# Patient Record
Sex: Male | Born: 1935 | Race: White | Hispanic: No | Smoking: Former smoker
Health system: Southern US, Community
[De-identification: ages and names within clinical notes are randomized; demographics above are authoritative.]

## PROBLEM LIST (undated history)

## (undated) DIAGNOSIS — I10 Essential (primary) hypertension: Secondary | ICD-10-CM

## (undated) DIAGNOSIS — I50810 Right heart failure, unspecified: Secondary | ICD-10-CM

## (undated) DIAGNOSIS — J449 Chronic obstructive pulmonary disease, unspecified: Secondary | ICD-10-CM

## (undated) DIAGNOSIS — E785 Hyperlipidemia, unspecified: Secondary | ICD-10-CM

## (undated) DIAGNOSIS — I779 Disorder of arteries and arterioles, unspecified: Secondary | ICD-10-CM

## (undated) DIAGNOSIS — Z5189 Encounter for other specified aftercare: Secondary | ICD-10-CM

## (undated) DIAGNOSIS — K222 Esophageal obstruction: Principal | ICD-10-CM

## (undated) DIAGNOSIS — K7460 Unspecified cirrhosis of liver: Secondary | ICD-10-CM

## (undated) DIAGNOSIS — K219 Gastro-esophageal reflux disease without esophagitis: Secondary | ICD-10-CM

## (undated) DIAGNOSIS — R188 Other ascites: Secondary | ICD-10-CM

## (undated) DIAGNOSIS — K635 Polyp of colon: Secondary | ICD-10-CM

## (undated) DIAGNOSIS — K5792 Diverticulitis of intestine, part unspecified, without perforation or abscess without bleeding: Secondary | ICD-10-CM

## (undated) DIAGNOSIS — E119 Type 2 diabetes mellitus without complications: Secondary | ICD-10-CM

## (undated) DIAGNOSIS — I2510 Atherosclerotic heart disease of native coronary artery without angina pectoris: Secondary | ICD-10-CM

## (undated) DIAGNOSIS — I739 Peripheral vascular disease, unspecified: Secondary | ICD-10-CM

## (undated) DIAGNOSIS — Z8739 Personal history of other diseases of the musculoskeletal system and connective tissue: Secondary | ICD-10-CM

## (undated) DIAGNOSIS — I5032 Chronic diastolic (congestive) heart failure: Secondary | ICD-10-CM

## (undated) DIAGNOSIS — N289 Disorder of kidney and ureter, unspecified: Secondary | ICD-10-CM

## (undated) DIAGNOSIS — I272 Pulmonary hypertension, unspecified: Secondary | ICD-10-CM

## (undated) DIAGNOSIS — IMO0001 Reserved for inherently not codable concepts without codable children: Secondary | ICD-10-CM

## (undated) DIAGNOSIS — J9610 Chronic respiratory failure, unspecified whether with hypoxia or hypercapnia: Secondary | ICD-10-CM

## (undated) HISTORY — DX: Reserved for inherently not codable concepts without codable children: IMO0001

## (undated) HISTORY — DX: Type 2 diabetes mellitus without complications: E11.9

## (undated) HISTORY — DX: Hyperlipidemia, unspecified: E78.5

## (undated) HISTORY — DX: Disorder of kidney and ureter, unspecified: N28.9

## (undated) HISTORY — DX: Diverticulitis of intestine, part unspecified, without perforation or abscess without bleeding: K57.92

## (undated) HISTORY — DX: Esophageal obstruction: K22.2

## (undated) HISTORY — PX: ELBOW SURGERY: SHX618

## (undated) HISTORY — DX: Gastro-esophageal reflux disease without esophagitis: K21.9

## (undated) HISTORY — PX: CORONARY STENT PLACEMENT: SHX1402

## (undated) HISTORY — PX: CORONARY ARTERY BYPASS GRAFT: SHX141

## (undated) HISTORY — DX: Essential (primary) hypertension: I10

## (undated) HISTORY — DX: Personal history of other diseases of the musculoskeletal system and connective tissue: Z87.39

## (undated) HISTORY — DX: Polyp of colon: K63.5

## (undated) HISTORY — PX: TONSILLECTOMY: SUR1361

## (undated) HISTORY — DX: Encounter for other specified aftercare: Z51.89

## (undated) HISTORY — PX: BACK SURGERY: SHX140

---

## 1991-10-03 HISTORY — PX: OTHER SURGICAL HISTORY: SHX169

## 1998-10-01 ENCOUNTER — Inpatient Hospital Stay (HOSPITAL_COMMUNITY): Admission: EM | Admit: 1998-10-01 | Discharge: 1998-10-04 | Payer: Self-pay | Admitting: Cardiology

## 1999-02-15 ENCOUNTER — Encounter: Payer: Self-pay | Admitting: Critical Care Medicine

## 1999-02-15 ENCOUNTER — Ambulatory Visit (HOSPITAL_COMMUNITY): Admission: RE | Admit: 1999-02-15 | Discharge: 1999-02-15 | Payer: Self-pay | Admitting: Critical Care Medicine

## 2000-02-14 ENCOUNTER — Ambulatory Visit (HOSPITAL_COMMUNITY): Admission: RE | Admit: 2000-02-14 | Discharge: 2000-02-14 | Payer: Self-pay | Admitting: Critical Care Medicine

## 2000-02-14 ENCOUNTER — Encounter: Payer: Self-pay | Admitting: Critical Care Medicine

## 2002-04-23 ENCOUNTER — Inpatient Hospital Stay (HOSPITAL_COMMUNITY): Admission: EM | Admit: 2002-04-23 | Discharge: 2002-04-24 | Payer: Self-pay | Admitting: Emergency Medicine

## 2002-04-23 ENCOUNTER — Encounter: Payer: Self-pay | Admitting: Nephrology

## 2002-04-24 ENCOUNTER — Encounter: Payer: Self-pay | Admitting: Nephrology

## 2004-04-11 ENCOUNTER — Inpatient Hospital Stay (HOSPITAL_COMMUNITY): Admission: AD | Admit: 2004-04-11 | Discharge: 2004-04-13 | Payer: Self-pay | Admitting: Cardiology

## 2004-07-30 ENCOUNTER — Inpatient Hospital Stay (HOSPITAL_COMMUNITY): Admission: EM | Admit: 2004-07-30 | Discharge: 2004-08-01 | Payer: Self-pay | Admitting: Emergency Medicine

## 2004-08-04 ENCOUNTER — Ambulatory Visit: Payer: Self-pay | Admitting: Family Medicine

## 2004-08-16 ENCOUNTER — Ambulatory Visit: Payer: Self-pay | Admitting: Family Medicine

## 2004-08-22 ENCOUNTER — Ambulatory Visit: Payer: Self-pay | Admitting: Cardiology

## 2004-09-05 ENCOUNTER — Ambulatory Visit: Payer: Self-pay

## 2004-09-20 ENCOUNTER — Ambulatory Visit (HOSPITAL_COMMUNITY): Admission: RE | Admit: 2004-09-20 | Discharge: 2004-09-20 | Payer: Self-pay | Admitting: Cardiology

## 2004-09-22 ENCOUNTER — Ambulatory Visit: Payer: Self-pay | Admitting: Family Medicine

## 2004-09-23 ENCOUNTER — Ambulatory Visit: Payer: Self-pay

## 2004-11-02 ENCOUNTER — Ambulatory Visit: Payer: Self-pay | Admitting: Family Medicine

## 2004-11-18 ENCOUNTER — Ambulatory Visit: Payer: Self-pay | Admitting: Family Medicine

## 2004-12-27 ENCOUNTER — Ambulatory Visit: Payer: Self-pay | Admitting: Family Medicine

## 2005-01-23 ENCOUNTER — Ambulatory Visit: Payer: Self-pay | Admitting: Cardiology

## 2005-01-30 ENCOUNTER — Ambulatory Visit: Payer: Self-pay | Admitting: Family Medicine

## 2005-02-06 ENCOUNTER — Ambulatory Visit: Payer: Self-pay | Admitting: Family Medicine

## 2005-03-06 ENCOUNTER — Ambulatory Visit: Payer: Self-pay | Admitting: Family Medicine

## 2005-04-21 ENCOUNTER — Ambulatory Visit: Payer: Self-pay | Admitting: Family Medicine

## 2005-04-24 ENCOUNTER — Ambulatory Visit: Payer: Self-pay | Admitting: Cardiology

## 2005-05-16 ENCOUNTER — Ambulatory Visit: Payer: Self-pay | Admitting: Family Medicine

## 2005-05-22 ENCOUNTER — Ambulatory Visit: Payer: Self-pay | Admitting: Family Medicine

## 2005-07-14 ENCOUNTER — Ambulatory Visit: Payer: Self-pay | Admitting: Family Medicine

## 2005-07-30 ENCOUNTER — Inpatient Hospital Stay (HOSPITAL_COMMUNITY): Admission: EM | Admit: 2005-07-30 | Discharge: 2005-08-02 | Payer: Self-pay | Admitting: *Deleted

## 2005-07-31 ENCOUNTER — Ambulatory Visit: Payer: Self-pay | Admitting: Cardiology

## 2005-08-04 ENCOUNTER — Ambulatory Visit: Payer: Self-pay | Admitting: Cardiology

## 2005-08-11 ENCOUNTER — Ambulatory Visit: Payer: Self-pay | Admitting: Cardiology

## 2005-08-21 ENCOUNTER — Ambulatory Visit: Payer: Self-pay | Admitting: Family Medicine

## 2006-01-24 ENCOUNTER — Ambulatory Visit: Payer: Self-pay | Admitting: Family Medicine

## 2006-01-26 ENCOUNTER — Ambulatory Visit: Payer: Self-pay | Admitting: Family Medicine

## 2006-02-02 ENCOUNTER — Ambulatory Visit: Payer: Self-pay | Admitting: Family Medicine

## 2006-02-13 ENCOUNTER — Ambulatory Visit: Payer: Self-pay | Admitting: Family Medicine

## 2006-03-05 ENCOUNTER — Ambulatory Visit: Payer: Self-pay | Admitting: Cardiology

## 2006-04-03 ENCOUNTER — Ambulatory Visit: Payer: Self-pay | Admitting: Family Medicine

## 2006-05-01 ENCOUNTER — Ambulatory Visit: Payer: Self-pay | Admitting: Family Medicine

## 2006-05-07 ENCOUNTER — Ambulatory Visit: Payer: Self-pay | Admitting: Family Medicine

## 2006-05-14 ENCOUNTER — Ambulatory Visit: Payer: Self-pay | Admitting: Family Medicine

## 2006-05-17 ENCOUNTER — Ambulatory Visit: Payer: Self-pay | Admitting: Family Medicine

## 2006-06-13 ENCOUNTER — Ambulatory Visit: Payer: Self-pay | Admitting: Family Medicine

## 2006-07-17 ENCOUNTER — Ambulatory Visit: Payer: Self-pay | Admitting: Family Medicine

## 2006-08-07 ENCOUNTER — Ambulatory Visit: Payer: Self-pay | Admitting: Family Medicine

## 2006-09-10 ENCOUNTER — Ambulatory Visit: Payer: Self-pay | Admitting: Cardiology

## 2006-09-26 ENCOUNTER — Ambulatory Visit: Payer: Self-pay | Admitting: Family Medicine

## 2006-10-19 ENCOUNTER — Ambulatory Visit: Payer: Self-pay | Admitting: Family Medicine

## 2006-11-13 ENCOUNTER — Ambulatory Visit: Payer: Self-pay | Admitting: Family Medicine

## 2007-03-21 ENCOUNTER — Ambulatory Visit: Payer: Self-pay | Admitting: Family Medicine

## 2007-04-25 ENCOUNTER — Ambulatory Visit: Payer: Self-pay | Admitting: Cardiology

## 2007-08-20 ENCOUNTER — Inpatient Hospital Stay (HOSPITAL_COMMUNITY): Admission: EM | Admit: 2007-08-20 | Discharge: 2007-08-23 | Payer: Self-pay | Admitting: Emergency Medicine

## 2007-08-20 ENCOUNTER — Ambulatory Visit: Payer: Self-pay | Admitting: Cardiology

## 2007-08-27 ENCOUNTER — Ambulatory Visit: Payer: Self-pay | Admitting: Cardiology

## 2008-02-10 ENCOUNTER — Ambulatory Visit: Payer: Self-pay | Admitting: Cardiology

## 2008-08-26 ENCOUNTER — Ambulatory Visit: Payer: Self-pay | Admitting: Cardiology

## 2008-10-15 ENCOUNTER — Inpatient Hospital Stay (HOSPITAL_COMMUNITY): Admission: EM | Admit: 2008-10-15 | Discharge: 2008-10-17 | Payer: Self-pay | Admitting: Emergency Medicine

## 2008-10-15 ENCOUNTER — Ambulatory Visit: Payer: Self-pay | Admitting: Internal Medicine

## 2008-11-17 ENCOUNTER — Ambulatory Visit: Payer: Self-pay | Admitting: Cardiology

## 2009-01-14 DIAGNOSIS — I10 Essential (primary) hypertension: Secondary | ICD-10-CM

## 2009-01-14 DIAGNOSIS — Z862 Personal history of diseases of the blood and blood-forming organs and certain disorders involving the immune mechanism: Secondary | ICD-10-CM

## 2009-01-14 DIAGNOSIS — E119 Type 2 diabetes mellitus without complications: Secondary | ICD-10-CM

## 2009-01-14 DIAGNOSIS — Z8639 Personal history of other endocrine, nutritional and metabolic disease: Secondary | ICD-10-CM

## 2009-01-14 DIAGNOSIS — E785 Hyperlipidemia, unspecified: Secondary | ICD-10-CM

## 2009-01-18 ENCOUNTER — Encounter: Payer: Self-pay | Admitting: Cardiology

## 2009-01-18 ENCOUNTER — Ambulatory Visit: Payer: Self-pay | Admitting: Cardiology

## 2009-05-16 ENCOUNTER — Emergency Department (HOSPITAL_COMMUNITY): Admission: EM | Admit: 2009-05-16 | Discharge: 2009-05-17 | Payer: Self-pay | Admitting: Emergency Medicine

## 2009-07-26 ENCOUNTER — Encounter (INDEPENDENT_AMBULATORY_CARE_PROVIDER_SITE_OTHER): Payer: Self-pay | Admitting: *Deleted

## 2009-07-29 ENCOUNTER — Ambulatory Visit: Payer: Self-pay | Admitting: Cardiology

## 2009-08-12 ENCOUNTER — Encounter: Payer: Self-pay | Admitting: Cardiology

## 2009-08-12 ENCOUNTER — Ambulatory Visit: Payer: Self-pay

## 2009-10-20 ENCOUNTER — Encounter: Payer: Self-pay | Admitting: Cardiology

## 2010-01-20 ENCOUNTER — Ambulatory Visit: Payer: Self-pay | Admitting: Cardiology

## 2010-01-24 ENCOUNTER — Encounter: Payer: Self-pay | Admitting: Cardiology

## 2010-06-20 ENCOUNTER — Telehealth: Payer: Self-pay | Admitting: Cardiology

## 2010-07-20 ENCOUNTER — Ambulatory Visit: Payer: Self-pay | Admitting: Cardiology

## 2010-08-22 ENCOUNTER — Encounter: Payer: Self-pay | Admitting: Cardiology

## 2010-08-22 ENCOUNTER — Ambulatory Visit: Payer: Self-pay

## 2010-08-23 ENCOUNTER — Telehealth (INDEPENDENT_AMBULATORY_CARE_PROVIDER_SITE_OTHER): Payer: Self-pay | Admitting: *Deleted

## 2010-08-31 ENCOUNTER — Encounter: Payer: Self-pay | Admitting: Cardiology

## 2010-11-01 NOTE — Progress Notes (Signed)
  Walk in Patient Form Recieved " Pt left paper from [facility] to be completed for Plavix" sent to Blanchard Mane [facility]iem[facility]  August 23, 2010 8:34 AM

## 2010-11-01 NOTE — Assessment & Plan Note (Signed)
Summary: f45m   Visit Type:  6 months follow up Primary Provider:  [place]  CC:  No cardiac complaints.  History of Present Illness: Has had some problems, but not cardiac.  Uric acid got very high.  He was placed on medication and that is down.  Then he developed bronchitis, and got put on antibiotics, and he is better.  He is still upset over the loss of his wife.  Dr. Lysbeth Galas gave him some sleeping pills.    Current Medications (verified): 1)  [facility] Spect[facility]ite Senior  Tabs (Multiple Vitamins-Minerals) .... Take 1 Tablet By Mouth Once A Day 2)  Metoprolol Tartrate 50 Mg Tabs (Metoprolol Tartrate) .... Take One Tablet By Mouth Twice A Day 3)  Hydrochlorothiazide 25 Mg Tabs (Hydrochlorothiazide) .... Take One Tablet By Mouth Daily. 4)  Plavix 75 Mg Tabs (Clopidogrel Bisulfate) .... Take One Tablet By Mouth Daily 5)  Meloxicam 7.5 Mg Tabs (Meloxicam) .... Take 1 Tablet By Mouth Once A Day 6)  Aspirin 81 Mg Tbec (Aspirin) .... Take One Tablet By Mouth Daily 7)  Omeprazole 20 Mg Tbec (Omeprazole) .... Take 1 Tablet By Mouth Once A Day 8)  Nitroglycerin 0.4 Mg Subl (Nitroglycerin) .... One Tablet Under Tongue Every 5 Minutes As Needed For Chest Pain---May Repeat Times Three 9)  Amlodipine Besylate 5 Mg Tabs (Amlodipine Besylate) .... Take 1 Tablet Am and 1/2 Tablet Pm 10)  Crestor 10 Mg Tabs (Rosuvastatin Calcium) .... Take 1/2 Tablet Daily 11)  Zetia 10 Mg Tabs (Ezetimibe) .... Take One Tablet By Mouth Daily.  Allergies (verified): No Known Drug Allergies  Vital Signs:  Patient profile:   77 year old male Height:      70 inches Weight:      178.25 pounds BMI:     25.67 Pulse rate:   47 / minute Pulse rhythm:   regular Resp:     18 per minute BP sitting:   130 / 60  (left arm) Cuff size:   large  Vitals Entered By: Vikki Ports (July 20, 2010 11:36 AM)  Physical Exam  General:  Well developed, well nourished, in no acute distress. Head:  normocephalic and atraumatic Eyes:   PERRLA/EOM intact; conjunctiva and lids normal. Lungs:  Clear bilaterally to auscultation and percussion. Heart:  PMI non displaced.  Normal S1 and S2.  Minimal SEM.  No DM.   Pulses:  pulses normal in all 4 extremities Extremities:  No clubbing or cyanosis.   EKG  Procedure date:  07/20/2010  Findings:      SB, otherwise ok.    Cardiac Cath  Procedure date:  10/16/2008  Findings:      CONCLUSION:   1. Preserved overall left ventricular function.   2. Continued patency of the internal mammary to the diagonal LAD.   3. Continued patency of the saphenous vein graft to the distal right       with continued patency of the stent at its insertion.   4. Continued patency of the stent to the mid circumflex.   5. Moderate lesions of the OM-2 and distal AV circumflex as described       above.      DISPOSITION:  I have carefully reviewed the old films and compared them   to the current studies.  The circumflex OM lesions do not appear to be   substantially progressed.  It potentially could be the source of   ischemia, but does not appear to be unstable as such, given the  current   anatomy I am inclined to treat him medically at the present time.  We   will see how he does first and I will see him back in followup in the   office soon.      Impression & Recommendations:  Problem # 1:  CAD, ARTERY BYPASS GRAFT (ICD-414.04)  No current symptoms at present time.  Dealing with grief of loss of wife. See cath report. His updated medication list for this problem includes:    Metoprolol Tartrate 50 Mg Tabs (Metoprolol tartrate) .Marland Kitchen... Take one tablet by mouth twice a day    Plavix 75 Mg Tabs (Clopidogrel bisulfate) .Marland Kitchen... Take one tablet by mouth daily    Aspirin 81 Mg Tbec (Aspirin) .Marland Kitchen... Take one tablet by mouth daily    Nitroglycerin 0.4 Mg Subl (Nitroglycerin) ..... One tablet under tongue every 5 minutes as needed for chest pain---may repeat times three    Amlodipine Besylate 5 Mg Tabs  (Amlodipine besylate) .Marland Kitchen... Take 1 tablet am and 1/2 tablet pm  Orders: EKG w/ Interpretation (93000) Carotid Duplex (Carotid Duplex)  Problem # 2:  DYSLIPIDEMIA (ICD-272.4)  followed by Dr. Lysbeth Galas. His updated medication list for this problem includes:    Crestor 10 Mg Tabs (Rosuvastatin calcium) .Marland Kitchen... Take 1/2 tablet daily    Zetia 10 Mg Tabs (Ezetimibe) .Marland Kitchen... Take one tablet by mouth daily.  Orders: EKG w/ Interpretation (93000) Carotid Duplex (Carotid Duplex)  Problem # 3:  HYPERTENSION (ICD-401.9)  stable.  Controlled at present time.  His updated medication list for this problem includes:    Metoprolol Tartrate 50 Mg Tabs (Metoprolol tartrate) .Marland Kitchen... Take one tablet by mouth twice a day    Hydrochlorothiazide 25 Mg Tabs (Hydrochlorothiazide) .Marland Kitchen... Take one tablet by mouth daily.    Aspirin 81 Mg Tbec (Aspirin) .Marland Kitchen... Take one tablet by mouth daily    Amlodipine Besylate 5 Mg Tabs (Amlodipine besylate) .Marland Kitchen... Take 1 tablet am and 1/2 tablet pm  Orders: EKG w/ Interpretation (93000) Carotid Duplex (Carotid Duplex)  Problem # 4:  CAROTID ARTERY STENOSIS (ICD-433.10)  Needs follow up carotid next month.  See prior report.  40-60% bilaterally. His updated medication list for this problem includes:    Plavix 75 Mg Tabs (Clopidogrel bisulfate) .Marland Kitchen... Take one tablet by mouth daily    Aspirin 81 Mg Tbec (Aspirin) .Marland Kitchen... Take one tablet by mouth daily  Orders: EKG w/ Interpretation (93000) Carotid Duplex (Carotid Duplex)  Patient Instructions: 1)  Your physician recommends that you continue on your current medications as directed. Please refer to the Current Medication list given to you today. 2)  Your physician wants you to follow-up in: 6 MONTHS.  You will receive a reminder letter in the mail two months in advance. If you don't receive a letter, please call our office to schedule the follow-up appointment. 3)  Your physician has requested that you have a carotid duplex. This  test is an ultrasound of the carotid arteries in your neck. It looks at blood flow through these arteries that supply the brain with blood. Allow one hour for this exam. There are no restrictions or special instructions.

## 2010-11-01 NOTE — Progress Notes (Signed)
Summary: Samples  Phone Note Call from Patient Call back at [facility] Phone [phone]   Caller: Patient Summary of Call: Pt request call Initial call taken by: Judie Grieve,  June 20, 2010 3:33 PM  Follow-up for Phone Call        The pt requested samples of Zetia #35, LOT [id number] EXP 06/13. Follow-up by: Julieta Gutting, RN, BSN,  June 20, 2010 4:46 PM

## 2010-11-01 NOTE — Assessment & Plan Note (Signed)
Summary: f31m   Visit Type:  6 months follow up Primary Provider:  [place]   History of Present Illness: Wife just died from pancreatitis.  She became suddenly sick.  They have been married for 53 years. .  Now he has less to do.  He is doing ok given all things concerned.  It is lonesome for him at night.  No chest pain, or major cardiac symptoms.  BP has been elevated.    Current Medications (verified): 1)  [facility] Spect[facility]ite Senior  Tabs (Multiple Vitamins-Minerals) .... Take 1 Tablet By Mouth Once A Day 2)  Metoprolol Tartrate 50 Mg Tabs (Metoprolol Tartrate) .... Take One Tablet By Mouth Twice A Day 3)  Hydrochlorothiazide 25 Mg Tabs (Hydrochlorothiazide) .... Take One Tablet By Mouth Daily. 4)  Plavix 75 Mg Tabs (Clopidogrel Bisulfate) .... Take One Tablet By Mouth Daily 5)  Meloxicam 7.5 Mg Tabs (Meloxicam) .... Take 1 Tablet By Mouth Once A Day 6)  Aspirin 81 Mg Tbec (Aspirin) .... Take One Tablet By Mouth Daily 7)  Omeprazole 20 Mg Tbec (Omeprazole) .... Take 1 Tablet By Mouth Once A Day 8)  Nitroglycerin 0.4 Mg Subl (Nitroglycerin) .... One Tablet Under Tongue Every 5 Minutes As Needed For Chest Pain---May Repeat Times Three 9)  Amlodipine Besylate 5 Mg Tabs (Amlodipine Besylate) .... Take 1 Tablet Am and 1/2 Tablet Pm 10)  Crestor 10 Mg Tabs (Rosuvastatin Calcium) .... Take 1/2 Tablet Daily 11)  Zetia 10 Mg Tabs (Ezetimibe) .... Take One Tablet By Mouth Daily.  Allergies (verified): No Known Drug Allergies  Past History:  Past Medical History: Last updated: 01/14/2009 Current Problems:  RENAL INSUFFICIENCY (ICD-588.9) DYSLIPIDEMIA (ICD-272.4) HYPERTENSION (ICD-401.9) DIABETES MELLITUS (ICD-250.00) CAROTID ARTERY STENOSIS (ICD-433.10) CAD, ARTERY BYPASS GRAFT (ICD-414.04) GOUT, HX OF (ICD-V12.2)  Past Surgical History: Last updated: 01/14/2009 Coronary artery bypass graft in 1993 Taxus stenting of the distal right coronary artery- July 2005  Vital Signs:  Patient  profile:   77 year old male Height:      70 inches Weight:      177.50 pounds BMI:     25.56 Pulse rate:   60 / minute Pulse rhythm:   regular Resp:     18 per minute BP sitting:   140 / 54  (left arm) Cuff size:   large  Vitals Entered By: Vikki Ports (January 20, 2010 11:58 AM)  Physical Exam  General:  Well developed, well nourished, in no acute distress. Head:  normocephalic and atraumatic Eyes:  PERRLA/EOM intact; conjunctiva and lids normal. Lungs:  Clear bilaterally to auscultation and percussion. Heart:  PMI non displaced.  Normal S1 and S2.  Without murmur or rub.   Abdomen:  Bowel sounds positive; abdomen soft and non-tender without masses, organomegaly, or hernias noted. No hepatosplenomegaly. Msk:  gouty tophus R arm--classic Pulses:  pulses normal in all 4 extremities Extremities:  No clubbing or cyanosis. Neurologic:  Alert and oriented x 3.   EKG  Procedure date:  01/20/2010  Findings:      NSR.  WNL.    Impression & Recommendations:  Problem # 1:  CAD, ARTERY BYPASS GRAFT (ICD-414.04)  Has had stent to SVG, and also CFX stent.  G[facility] plavix [facility] through the [employer].  Gets it through [place].  Last cath 2008. His updated medication list for this problem includes:    Metoprolol Tartrate 50 Mg Tabs (Metoprolol tartrate) .Marland Kitchen... Take one tablet by mouth twice a day    Plavix 75 Mg  Tabs (Clopidogrel bisulfate) .Marland Kitchen... Take one tablet by mouth daily    Aspirin 81 Mg Tbec (Aspirin) .Marland Kitchen... Take one tablet by mouth daily    Nitroglycerin 0.4 Mg Subl (Nitroglycerin) ..... One tablet under tongue every 5 minutes as needed for chest pain---may repeat times three    Amlodipine Besylate 5 Mg Tabs (Amlodipine besylate) .Marland Kitchen... Take 1 tablet am and 1/2 tablet pm  Orders: EKG w/ Interpretation (93000)  Problem # 2:  HYPERTENSION (ICD-401.9) borderline systolics.   His updated medication list for this problem includes:    Metoprolol Tartrate 50 Mg Tabs  (Metoprolol tartrate) .Marland Kitchen... Take one tablet by mouth twice a day    Hydrochlorothiazide 25 Mg Tabs (Hydrochlorothiazide) .Marland Kitchen... Take one tablet by mouth daily.    Aspirin 81 Mg Tbec (Aspirin) .Marland Kitchen... Take one tablet by mouth daily    Amlodipine Besylate 5 Mg Tabs (Amlodipine besylate) .Marland Kitchen... Take 1 tablet am and 1/2 tablet pm  Problem # 3:  DYSLIPIDEMIA (ICD-272.4) awaiting copies of that.  His updated medication list for this problem includes:    Crestor 10 Mg Tabs (Rosuvastatin calcium) .Marland Kitchen... Take 1/2 tablet daily    Zetia 10 Mg Tabs (Ezetimibe) .Marland Kitchen... Take one tablet by mouth daily.  Problem # 4:  GOUT, HX OF (ICD-V12.2) tophus r arm. Reasonable candidate for surgery at this point.   Patient Instructions: 1)  Your physician recommends that you continue on your current medications as directed. Please refer to the Current Medication list given to you today. 2)  Your physician wants you to follow-up in:  6 MONTHS.  You will receive a reminder letter in the mail two months in advance. If you don't receive a letter, please call our office to schedule the follow-up appointment.

## 2010-11-03 NOTE — Medication Information (Signed)
Summary: [facility] Plavix Request Form   [facility] Plavix Request Form   Imported By: Roderic Ovens 09/22/2010 12:04:21  _____________________________________________________________________  External Attachment:    Type:   Image     Comment:   External Document

## 2011-01-07 LAB — BASIC METABOLIC PANEL
BUN: 24 mg/dL — ABNORMAL HIGH (ref 6–23)
CO2: 30 mEq/L (ref 19–32)
Calcium: 8.9 mg/dL (ref 8.4–10.5)
Chloride: 104 mEq/L (ref 96–112)
Creatinine, Ser: 1.02 mg/dL (ref 0.4–1.5)
GFR calc Af Amer: >60 mL/min (ref >60)
GFR calc non Af Amer: >60 mL/min (ref >60)
Glucose, Bld: 167 mg/dL — ABNORMAL HIGH (ref 70–99)
Potassium: 4.2 mEq/L (ref 3.5–5.1)
Sodium: 140 mEq/L (ref 135–145)

## 2011-01-07 LAB — POCT I-STAT, CHEM 8
BUN: 29 mg/dL — ABNORMAL HIGH (ref 6–23)
Calcium, Ion: 1.19 mmol/L (ref 1.12–1.32)
Chloride: 103 mEq/L (ref 96–112)
Creatinine, Ser: 1 mg/dL (ref 0.4–1.5)
Glucose, Bld: 154 mg/dL — ABNORMAL HIGH (ref 70–99)
HCT: 39 % (ref 39.0–52.0)
Hemoglobin: 13.3 g/dL (ref 13.0–17.0)
Potassium: 4.2 mEq/L (ref 3.5–5.1)
Sodium: 141 mEq/L (ref 135–145)
TCO2: 26 mmol/L (ref 0–100)

## 2011-01-07 LAB — CBC
HCT: 37.5 % — ABNORMAL LOW (ref 39.0–52.0)
Hemoglobin: 13 g/dL (ref 13.0–17.0)
MCHC: 34.6 g/dL (ref 30.0–36.0)
MCV: 92.1 fL (ref 78.0–100.0)
Platelets: 306 10*3/uL (ref 150–400)
RBC: 4.07 MIL/uL — ABNORMAL LOW (ref 4.22–5.81)
RDW: 13.1 % (ref 11.5–15.5)
WBC: 12 10*3/uL — ABNORMAL HIGH (ref 4.0–10.5)

## 2011-01-07 LAB — POCT CARDIAC MARKERS
CKMB, poc: <1 ng/mL — ABNORMAL LOW (ref 1.0–8.0)
Myoglobin, poc: 60.6 ng/mL (ref 12–200)
Troponin i, poc: <0.05 ng/mL (ref 0.00–0.09)

## 2011-01-16 LAB — COMPREHENSIVE METABOLIC PANEL
ALT: 13 U/L (ref 0–53)
AST: 18 U/L (ref 0–37)
Albumin: 3.9 g/dL (ref 3.5–5.2)
Alkaline Phosphatase: 88 U/L (ref 39–117)
BUN: 23 mg/dL (ref 6–23)
CO2: 25 mEq/L (ref 19–32)
Calcium: 9.6 mg/dL (ref 8.4–10.5)
Chloride: 101 mEq/L (ref 96–112)
Creatinine, Ser: 1.2 mg/dL (ref 0.4–1.5)
GFR calc Af Amer: >60 mL/min (ref >60)
GFR calc non Af Amer: 59 mL/min — ABNORMAL LOW (ref >60)
Glucose, Bld: 110 mg/dL — ABNORMAL HIGH (ref 70–99)
Potassium: 4.4 mEq/L (ref 3.5–5.1)
Sodium: 143 mEq/L (ref 135–145)
Total Bilirubin: 1.1 mg/dL (ref 0.3–1.2)
Total Protein: 6.2 g/dL (ref 6.0–8.3)

## 2011-01-16 LAB — PROTIME-INR
INR: 0.9 (ref 0.00–1.49)
Prothrombin Time: 12.6 seconds (ref 11.6–15.2)

## 2011-01-16 LAB — CK TOTAL AND CKMB (NOT AT ARMC)
CK, MB: 2.1 ng/mL (ref 0.3–4.0)
Relative Index: INVALID (ref 0.0–2.5)
Total CK: 73 U/L (ref 7–232)

## 2011-01-16 LAB — HEPARIN LEVEL (UNFRACTIONATED): Heparin Unfractionated: 0.86 IU/mL — ABNORMAL HIGH (ref 0.30–0.70)

## 2011-01-16 LAB — D-DIMER, QUANTITATIVE: D-Dimer, Quant: 0.8 ug/mL-FEU — ABNORMAL HIGH (ref 0.00–0.48)

## 2011-01-16 LAB — DIFFERENTIAL
Basophils Absolute: 0.2 10*3/uL — ABNORMAL HIGH (ref 0.0–0.1)
Basophils Relative: 2 % — ABNORMAL HIGH (ref 0–1)
Eosinophils Absolute: 0.3 10*3/uL (ref 0.0–0.7)
Eosinophils Relative: 3 % (ref 0–5)
Lymphocytes Relative: 28 % (ref 12–46)
Lymphs Abs: 2.8 10*3/uL (ref 0.7–4.0)
Monocytes Absolute: 0.9 10*3/uL (ref 0.1–1.0)
Monocytes Relative: 9 % (ref 3–12)
Neutro Abs: 6 10*3/uL (ref 1.7–7.7)
Neutrophils Relative %: 58 % (ref 43–77)

## 2011-01-16 LAB — BASIC METABOLIC PANEL
BUN: 23 mg/dL (ref 6–23)
CO2: 26 mEq/L (ref 19–32)
Chloride: 106 mEq/L (ref 96–112)
GFR calc Af Amer: >60 mL/min (ref >60)
GFR calc non Af Amer: >60 mL/min (ref >60)
Potassium: 4.6 mEq/L (ref 3.5–5.1)
Potassium: 4 mEq/L (ref 3.5–5.1)
Sodium: 137 mEq/L (ref 135–145)

## 2011-01-16 LAB — CBC
HCT: 42.3 % (ref 39.0–52.0)
Hemoglobin: 13.9 g/dL (ref 13.0–17.0)
Hemoglobin: 12.7 g/dL — ABNORMAL LOW (ref 13.0–17.0)
MCHC: 33 g/dL (ref 30.0–36.0)
MCHC: 33.1 g/dL (ref 30.0–36.0)
MCV: 94.9 fL (ref 78.0–100.0)
MCV: 94.3 fL (ref 78.0–100.0)
Platelets: 375 10*3/uL (ref 150–400)
RBC: 4.45 MIL/uL (ref 4.22–5.81)
RBC: 4.05 MIL/uL — ABNORMAL LOW (ref 4.22–5.81)
RDW: 12.6 % (ref 11.5–15.5)
WBC: 10.3 10*3/uL (ref 4.0–10.5)

## 2011-01-16 LAB — CARDIAC PANEL(CRET KIN+CKTOT+MB+TROPI)
CK, MB: 1.5 ng/mL (ref 0.3–4.0)
Troponin I: 0.02 ng/mL (ref 0.00–0.06)
Troponin I: <0.01 ng/mL (ref 0.00–0.06)

## 2011-01-16 LAB — GLUCOSE, CAPILLARY
Glucose-Capillary: 130 mg/dL — ABNORMAL HIGH (ref 70–99)
Glucose-Capillary: 132 mg/dL — ABNORMAL HIGH (ref 70–99)
Glucose-Capillary: 219 mg/dL — ABNORMAL HIGH (ref 70–99)

## 2011-01-16 LAB — MAGNESIUM: Magnesium: 1.9 mg/dL (ref 1.5–2.5)

## 2011-01-16 LAB — APTT: aPTT: 23 seconds — ABNORMAL LOW (ref 24–37)

## 2011-01-16 LAB — LIPID PANEL
HDL: 29 mg/dL — ABNORMAL LOW (ref >39)
Total CHOL/HDL Ratio: 7.7 RATIO

## 2011-01-16 LAB — TROPONIN I: Troponin I: <0.01 ng/mL (ref 0.00–0.06)

## 2011-02-14 NOTE — Discharge Summary (Signed)
NAMEJORIAN, WILLHOITE NO.:  [id number]   MEDICAL RECORD NO.:  [id number]          PATIENT TYPE:  INP   LOCATION:  2034                         FACILITY:  [facility]   PHYSICIAN:  Dorian Pod, ACNP  DATE OF BIRTH:  04-09-1929   DATE OF ADMISSION:  08/20/2007  DATE OF DISCHARGE:  08/23/2007                               DISCHARGE SUMMARY   DISCHARGING PHYSICIAN:  Dr. Bonnee Quin.   PRIMARY CARDIOLOGIST:  Dr. Riley Kill.   DISCHARGE DIAGNOSES:  1. Chest pain status post cardiac catheterization this admission.  2. The patient with well-preserved left ventricular function,      continued patency of internal mammary to the diagonal and left      anterior descending.  3. Continued patency of drug-eluting stent to the distal right vein      graft at it's insertion into the right coronary artery.  4. High grade stenosis of the circumflex, status post successful      percutaneous stenting of this vessel with an [employer] drug-eluting      stent.  5. Hyperlipidemia, LDL 132, the patient on Zetia with a history of      intolerance to Statins in the past.  6. Hypertension, stable at this time.  7. Peripheral arterial disease, history of carotid disease, positive      femoral bruits continue aspirin and Plavix and Zetia.  8. Gastroesophageal reflux disease.  Continue protein pump inhibitor.   PAST MEDICAL HISTORY:  1. Mild renal insufficiency.  2. Arthritis with chronic back pain.  3. Gout.  4. History of tobacco use.   PROCEDURES THIS ADMISSION:  Include cardiac catheterization.   HOSPITAL COURSE:  Mr. Gravely is a very pleasant 77 year old Caucasian  gentleman with known history of coronary disease, followed by Dr.  Riley Kill.  The patient presented this admission complaining of chest  discomfort similar to previous cardiac discomfort.  The patient is  status post coronary artery bypass grafting in 1993 and a Taxus drug-  eluting stent to the distal RCA via saphenous vein  graft in July 2005.  The patient states he has done well since that time.  He has been on  Plavix long-term.  This admission, chest discomfort came on suddenly.  He decided to get evaluated.  EKG in the emergency department showed  nonspecific ST-T wave changes, otherwise sinus rhythm.  Point of care  markers were normal.  Last evaluation (cath) was in 2005.  The patient  was admitted for observation, continued home medications with the  addition of IV nitroglycerin and heparin.  The patient evaluated by Dr.  Riley Kill, on the following day recommended cardiac catheterization for re-  evaluation, the patient to the cath lab on August 21, 2007, results as  stated above.  The patient tolerated procedure without complications,  did complain of some gout discomfort treated with a narcotic.  The  patient remained stable, transferred to telemetry.  Dr. Riley Kill in to  see the patient on day of discharge, afebrile, blood pressure 160/70,  cath site stable, hematocrit 35.5.  Patient being discharged home to  follow up with him Tuesday.  Appointment has been arranged for November  25 at 11:15 a.m.  The patient instructed to continue previous  medications at time of discharge including aspirin 325, Plavix 75,  Norvasc 5, HCTZ 25, Lopressor 50 b.i.d., Zetia 10 mg daily, Prilosec 20  mg daily, nitroglycerin as needed.  The patient has been given new  prescriptions for the Plavix, Norvasc, HCT, Lopressor, Zetia and  nitroglycerin.  He is also been given the post-cardiac catheterization  discharge instructions.  Duration of discharge encounter less than 30  minutes.      Dorian Pod, ACNP     MB/MEDQ  D:  08/23/2007  T:  08/24/2007  Job:  [id number]

## 2011-02-14 NOTE — Assessment & Plan Note (Signed)
[facility]                            CARDIOLOGY OFFICE NOTE   NAME:Weldy, DURANT SCIBILIA                     MRN:          [id number]  DATE:08/26/2008                            DOB:          [birth year]    DATE OF STUDY:  August 30, 2008.   Mr. Hochstatter is in for followup.  He is stable.  He has not been having  chest pain.  He is doing really quite well.  He has seen Dr. Lysbeth Galas  recently.  He is able to be active and without much difficulty.   His omeprazole recently was discontinued.   CURRENT MEDICATIONS:  He currently takes;  1. Multivitamin daily.  2. Metoprolol 50 mg b.i.d.  3. Hydrochlorothiazide 25 daily.  4. Zetia has been stopped as well.  5. Amlodipine 5 mg daily.  6. Plavix 75 mg daily.  7. Aspirin 81 mg daily.  8. Crestor 5 mg daily.   PHYSICAL EXAMINATION:  VITAL SIGNS:  Blood pressure is 142/60 and pulse  is 64.  LUNG:  Fields are clear.  CARDIAC:  Rhythm is regular.  EXTREMITIES:  No edema.   Electrocardiogram demonstrates normal sinus rhythm and nonspecific ST  abnormality.   Overall, the patient is doing well.   I plan to have him return to me in 6 months.  He is going to see Dr.  Lysbeth Galas next week to get laboratory work done.  Should he have any  problems in the interim, he is to call me.  I have told him that he can  reduce his aspirin to 81 mg.     [name]. Riley Kill, MD, [facility]  Electronically Signed    TDS/MedQ  DD: 08/26/2008  DT: 08/27/2008  Job #: [id number]

## 2011-02-14 NOTE — H&P (Signed)
NAMEJAKAREE, PICKARD NO.:  [id number]   MEDICAL RECORD NO.:  [id number]          PATIENT TYPE:  INP   LOCATION:  1823                         FACILITY:  [facility]   PHYSICIAN:  Jonelle Sidle, MD DATE OF BIRTH:  1929/05/12   DATE OF ADMISSION:  08/20/2007  DATE OF DISCHARGE:  08/20/2007                              HISTORY & PHYSICAL   PRIMARY CARDIOLOGIST:  Dr. Shawnie Pons.   REASON FOR ADMISSION:  Unstable angina.   HISTORY OF PRESENT ILLNESS:  Mr. Glazer is a pleasant 77 year old  gentleman with longstanding history of coronary artery disease.  He has  been followed long-term by Dr. Riley Kill and last saw him in the office  back in July at which time he was symptomatically stable.  The patient's  history includes coronary artery disease status post coronary artery  bypass grafting in 1993 including a LIMA to left anterior descending and  first diagonal as well as a saphenous vein graft to the right coronary  artery.  More recently the patient underwent placement of a Taxus drug-  eluting stent to the distal right coronary artery via the saphenous vein  graft in July 2005 and has done well since that time.  He has been on  Plavix long term.  Symptomatically, Mr. Morrell states that he has been  stable, although a few days ago he experienced a little chest pain  when he was walking in the evening.  Last night, while he was working at  the computer, he suddenly developed sensation of chest pressure.  This  lasted for several hours and waxed and waned.  Caused him to sleep very  poorly overnight and it was only earlier today when he was finally able  to take some sublingual nitroglycerin.  He states that he ultimately had  to take four or five tablets to cause his chest pain to improve and he  ultimately presented to the emergency department later this evening.  At  the present time, he is chest pain free, although did have some  recurrent pain earlier when he  presented.  His electrocardiogram in the  emergency department shows nonspecific ST-T wave changes with sinus  rhythm at 78 beats per minute and a prolonged PR interval of 217  milliseconds.  He has been compliant with medications which are outlined  below.  Initial point of care markers are normal.  He has not had any  ischemic evaluation since 2005.   ALLERGIES:  NO KNOWN DRUG ALLERGIES.   PRESENT MEDICATIONS:  1. Enteric-coated aspirin 81 mg p.o. daily.  2. Multivitamin one daily.  3. Hydrochlorothiazide 25 mg p.o. daily.  4. Norvasc 5 mg p.o. daily.  5. Prilosec 20 mg p.o. daily.  6. Metoprolol 50 mg p.o. b.i.d.  7. Plavix 75 mg p.o. daily.  8. Zetia 10 mg p.o. daily.   PAST MEDICAL HISTORY:  Is as outlined above.  Additional problems  include:  1. Hypertension.  2. Hyperlipidemia with previous intolerance to ZOCOR.  3. Carotid artery disease with a 60-80% left internal carotid artery      stenosis.  4.  Gastroesophageal reflux disease.  5. Previously noted mild renal insufficiency.  6. Arthritis with chronic back pain.   SOCIAL HISTORY:  The patient lives in [place].  He has a longstanding  tobacco use history.  He drinks alcohol occasionally.  He is retired  from [employer] many years ago.   FAMILY HISTORY:  Was reviewed.  There is a history of diabetes and  hypertension.   REVIEW OF SYSTEMS:  The patient has not had any recent fevers or chills.  No cough, hemoptysis, palpitations, syncope, lower extremity edema,  orthopnea or PND.  No obvious claudication.   EXAMINATION:  Blood pressure is 178/89, heart rate is 79.  The patient  breathing comfortably 20 times a minute.  This is an elderly male, no  acute distress, without active chest pain.  HEENT:  Conjunctivae/lids normal.  Oropharynx clear.  NECK:  Supple.  No elevated jugular venous pressure.  Soft left carotid  bruit.  LUNGS:  Clear, diminished breath sounds, somewhat rhonchorous  towards  the bases.  No wheezing or labored breathing.  CARDIAC EXAM:  Reveals a regular rate and rhythm.  Soft systolic murmur.  Preserved S2, no S3 gallop, pericardial rub.  ABDOMEN:  Soft, nontender, normoactive bowel sounds.  EXTREMITIES:  Exhibit no significant pitting edema.  Distal pulses are 1  to 2+.  SKIN:  Warm and dry.  MUSCULOSKELETAL:  No kyphosis is noted.  NEUROPSYCHIATRIC:  The patient is alert and oriented x3.  Affect is  normal.   INITIAL LABORATORY DATA:  WBC is 9.7, hemoglobin 14.4, hematocrit 42.7,  platelets 388,000.  Sodium 140, potassium 4.8, chloride 107, glucose  235, BUN 25, creatinine 1.2, CK-MB 2.4, troponin-I less than 0.05,  myoglobin 96.5.   IMPRESSION:  1. Symptoms concerning for unstable angina, stuttering over the last      24 hours and improved with sublingual nitroglycerin.  This is in      the setting of known cardiovascular disease status post previous      coronary artery bypass grafting in 1993 and ultimately status post      placement of a Taxus drug-eluting stent to the right coronary      artery via a saphenous vein graft in 2005.  Until recently, the      patient has been symptomatically stable and he has had no ischemic      evaluation over the last 3 years.  His electrocardiogram is      nonspecific and his initial point of care cardiac markers are      normal.  2. Hyperlipidemia, on Zetia, with history of intolerance to ZOCOR.  3. Hypertension.  4. Carotid artery disease, 60-80% on the left.  5. Gastroesophageal reflux disease.   PLAN:  The patient will be admitted to the telemetry unit.  We will  continue his home medications with the addition of intravenous  nitroglycerin and heparin.  He will have a follow-up chest x-ray and  laboratory data including cycling  of cardiac markers.  We will keep him n.p.o. after midnight and begin  gentle hydration in the morning.  I suspect that a diagnostic cardiac  catheterization will be pursued,  although will defer this decision  ultimately to Dr. Riley Kill.  I reviewed this with the patient.      Jonelle Sidle, MD  Electronically Signed     SGM/MEDQ  D:  08/20/2007  T:  08/21/2007  Job:  [id number]   cc:   [name]. Riley Kill, MD, [facility]

## 2011-02-14 NOTE — Assessment & Plan Note (Signed)
 HEALTHCARE                            CARDIOLOGY OFFICE NOTE   NAME:Brendan Arroyo, Brendan Arroyo                     MRN:          [id number]  DATE:04/25/2007                            DOB:          July 20, 1929    Brendan Arroyo is in for followup.  He is stable.  He has not been having any  ongoing chest pain or shortness of breath.  He continues to do well.  The patient had a drug-eluting stent placed in the distal right coronary  beyond the graft insertion site, and this was done in July 2005.  He has  done well from this standpoint.  He has had some joint aching and wants  to go off his Simvastatin.  He also has gout in the right elbow which  has started to bother him, and [facility] has planned to  operate on that.  He was told he would need to be off Plavix.  He and I  discussed this at length today.   PHYSICAL:  The blood pressure is 120/60.  The pulse is 51.  The lung  fields are clear, and the cardiac rhythm is regular.   His recent laboratory studies done at Dr. Loring Copa office reveal ALDL of  86 with a hemoglobin A1c of 6.5.   The electrocardiogram demonstrates sinus bradycardia.  Otherwise normal.   IMPRESSION:  1. Coronary artery disease status post coronary artery bypass graft      surgery.  2. Status post percutaneous coronary intervention with a drug-eluting      platform in July 2005.  3. Hypercholesterolemia on lipid-lowering therapy.  4. History of arthritis, question related to statins in any way.  5. Hypercholesterolemia.   PLAN:  1. The patient can come off of Plavix.  I would recommend that he stay      on aspirin, and he and I have discussed this in detail.  He should      remain on aspirin throughout the course of his procedure.  2. We also discussed whether or not to continue Plavix long-term,      specifically with restarting it or whether he can stay off of it.      He would be eligible to come off of it, and we have  talked about      the possibility of a small increased risk of late stent thrombosis.  3. He wants to take a 60 day holiday from Simvastatin to see if it      helps his arthritis, and we discussed possible strategies for this.  4. We will see him back in followup in 6 months in the cardiology      clinic.     [name]. Riley Kill, MD, [facility]  Electronically Signed   TDS/MedQ  DD: 04/25/2007  DT: 04/25/2007  Job #: [id number]   cc:   Delaney Meigs, M.D.

## 2011-02-14 NOTE — Discharge Summary (Signed)
NAME:  Brendan Arroyo, Brendan Arroyo NO.:  [id number]   MEDICAL RECORD NO.:  [id number]          PATIENT TYPE:  INP   LOCATION:  3734                         FACILITY:  [facility]   PHYSICIAN:  Hillis Range, MD       DATE OF BIRTH:  Aug 10, 1929   DATE OF ADMISSION:  10/15/2008  DATE OF DISCHARGE:  10/17/2008                               DISCHARGE SUMMARY   PRIMARY CARDIOLOGIST:  Maisie Fus D. Riley Kill, MD, [facility]   PRIMARY CARE PHYSICIAN:  Delaney Meigs, MD   REASON FOR ADMISSION:  Chest pain.   DISCHARGE DIAGNOSES:  1. Noncardiac chest pain.  2. Coronary artery disease, status post bypass surgery in 1993 -      medical therapy continued.      a.     Status post stenting to the vein graft to the RCA in 2005.      b.     Status post PCI to the native circumflex in November 2008.  3. Cerebellar vascular disease with history of 60-80% left internal      carotid artery stenosis.  4. Diabetes mellitus.  5. Hypertension.  6. Dyslipidemia.  7. Renal insufficiency.  8. Good left ventricular function.   PROCEDURE PERFORMED THIS ADMISSION:  Cardiac catheterization by Dr.  Shawnie Pons.  Please see the dictated note for complete details.  Overall, The patient had a patent stent with distal vein graft to the  RCA.  Patent stent in the mid circumflex, patent LIMA to the diagonal  and LAD, 60% obtuse marginal stenosis and 50% distal RCA stenosis.   ADMISSION HISTORY:  [name] is a 77 year old male with a history of  coronary artery disease status post bypass surgery in 1993.  Since  bypass surgery, he has had stenting of the vein graft to the RCA and PCI  to the native circumflex approximately a year ago.  The patient  presented to [facility] with complaints of chest pressure and  associated shortness of breath similar to his previous angina.   HOSPITAL COURSE:  The patient was admitted for further evaluation.  He  ruled out for myocardial infarction by enzymes.  He was seen  by Dr.  Riley Kill and set up for cardiac catheterization on October 16, 2008.  He  underwent the procedure as outlined above.  Medical therapy was  recommended for his coronary disease.  He tolerated the procedure well,  had no immediate complications.  He was evaluated on the morning of  October 17, 2008 and found to be in stable condition and ready for  discharge to home.  The patient's blood pressure is somewhat  uncontrolled, therefore his Norvasc was increased to 10 mg a day.   LABORATORY DATA:  Hemoglobin 12.7, MCV 94.3, and platelet count 305,000.  D-dimer 0.8, sodium 137, potassium 4, glucose 120, creatinine 0.92.  BNP  84.  Total cholesterol 222, triglycerides 207, HDL 29, LDL 152, and TSH  1.610.   CT angiogram of the chest on October 15, 2008 demonstrated no acute  abnormalities, no pulmonary emboli, extensive coronary artery  calcifications.  Chest x-ray, no  active cardiopulmonary disease.   DISCHARGE MEDICATIONS:  1. Norvasc was increased to 10 mg a day.  2. Metoprolol 50 mg b.i.d.  3. Hydrochlorothiazide 25 mg daily.  4. Multivitamin daily.  5. Plavix 75 mg daily.  6. Aspirin 81 mg daily.  7. Crestor 5 mg daily.  8. The patient was advised to take Tylenol 325 mg 1-2 every 8 hours      p.r.n. pain.   ALLERGIES:  No known drug allergies.   DIET:  Low-fat, low-sodium diabetic diet.   WOUND CARE:  The patient should follow in our office for any groin  swelling, bleeding, bruising or fever.   ACTIVITY:  The patient is to increase his activity slowly.  He may walk  up steps.  He may shower now and bathe in 3 days.  No lifting, driving  or sexual activities for 2 days.   FOLLOWUP:  1. Follow up will be with Dr. Riley Kill in the next 3-4 weeks in the      office.  We will contact him with appointment.  He will likely need      his Crestor dose adjusted when he see Dr. Riley Kill back at followup,      given his abnormal lipid panel as outlined above.  2. He will follow up  with Dr. Alphonsus Sias as indicated.   Total time on this discharge, greater than 30 minutes.      Tereso Newcomer, PA-C      Hillis Range, MD  Electronically Signed    SW/MEDQ  D:  10/17/2008  T:  10/18/2008  Job:  [id number]   cc:   Delaney Meigs, M.D.

## 2011-02-14 NOTE — Assessment & Plan Note (Signed)
[facility]                            CARDIOLOGY OFFICE NOTE   NAME:Roets, LEVITICUS HARTON                     MRN:          [id number]  DATE:11/17/2008                            DOB:          06/07/1929    Mr. Buda is in for followup.  In general, he is stable.  His biggest  complaint is that of his great toe.  He has a hemorrhage under the large  toenail, which he says they are before the cath procedure, but at that  time it remained sore, does not appear to have improved.  However, it  does not appear to be infected.  Cardiac-wise, he is doing reasonably  well.  While he was in the hospital in January, he had a CT angiography  of the chest done, which did not reveal evidence of pulmonary emboli.  There was some prominent nodes and extensive calcification of the  thoracic aorta.  His chest x-ray at that time was unremarkable.  He did  undergo repeat cardiac catheterization.  This revealed preserved overall  left ventricular function, continued patency of the internal mammary to  the diagonal and LAD, continued patency of the saphenous vein graft to  the distal right with continued patency of the stent at its insertion  and continued patency of the stent to the mid circumflex.  There were  moderate lesions of the OM-2 and distal AV circumflex as noted.  We  therefore elected to recommend medical therapy.  He has been relatively  stable since that time, does not have a lot of progression of symptoms.   MEDICATIONS:  1. Multivitamin 1 daily.  2. Metoprolol 50 mg p.o. b.i.d.  3. Hydrochlorothiazide 25 mg daily.  4. Plavix 75 mg daily.  5. Crestor 5 mg daily.  6. Amlodipine 10 mg daily.  7. Aspirin 325 mg daily.   PHYSICAL EXAMINATION:  GENERAL:  He is alert and oriented, in no  distress.  VITAL SIGNS:  Blood pressure 158/72 and pulse 56.  LUNGS:  Lung fields clear.  CARDIAC:  There is an S4 gallop.  EXTREMITIES:  The toe does reveal what looks like  a sub-nail hemorrhage.  It looks like old blood primarily.   RECOMMENDATIONS:  1. Decrease aspirin to 81 mg.  2. Return to clinic in 2 months.  3. Needs followup laboratories with Dr. Lysbeth Galas.    [name]. Riley Kill, MD, [facility]  Electronically Signed   TDS/MedQ  DD: 11/20/2008  DT: 11/21/2008  Job #: [id number]   cc:   Delaney Meigs, M.D.

## 2011-02-14 NOTE — H&P (Signed)
NAME:  Brendan Arroyo, Brendan Arroyo NO.:  [id number]   MEDICAL RECORD NO.:  [id number]          PATIENT TYPE:  INP   LOCATION:  3734                         FACILITY:  [facility]   PHYSICIAN:  Hillis Range, MD       DATE OF BIRTH:  08-28-1929   DATE OF ADMISSION:  10/15/2008  DATE OF DISCHARGE:                              HISTORY & PHYSICAL   PRIMARY CARDIOLOGIST:  [name]. Riley Kill, MD, [facility]   CHIEF COMPLAINT:  Chest pain.   HISTORY OF PRESENT ILLNESS:  Two days ago the patient began experiencing  fleeting chest pain lasting seconds that was sharp on the left side of  chest.  This preceded chest pressure that came on both at rest and with  exertion.  Chest pressure located in the substernal region and  associated with shortness of breath.  The patient reports this chest  pressure and associated shortness of breath as being precisely the same  sensation that he has experienced during his previous cardiac events.  The patient reports that rest and/or nitroglycerin relieves symptoms.  Yesterday, the patient walked 1 mile as per his usual exercise routine  with onset of symptoms that caused him to slow down.  The patient has  also awakened in the night with symptoms.  The patient denies fever,  chills, cough, but has had some frontal headaches, left lower extremity  pain with exertion and intermittent abdominal pain.  No nausea or  vomiting over the last several days.   PAST MEDICAL HISTORY:  1. Coronary artery disease status post CABG in 1993.  Last cath in      November 2008, showed well-preserved LV function.  EF approximately      equal to 65%, pattern LIMA to diagonal, pattern DES to distal SVG      and to RCA, high-grade stenosis of circumflex with successful PCI      of that vessel.  2. Mild renal insufficiency.  3. Arthritis with chronic back pain.  4. Gout.  5. History of tobacco abuse disorder.  6. Hypertension.  7. Hyperlipidemia.  8. Carotid artery disease.  Last  exam showed 60-80% stenosis in the      left ICA.  9. Diabetes mellitus, controlled with diet and exercise.   SOCIAL HISTORY:  The patient lives in [place].  He is retired.  Has  a 40-pack-year smoking history.  He quit in the middle of last year.  Social drinker, no drugs.  Only interval medication is multivitamin.  Tries to eat heart-healthy diet and exercises regularly.   FAMILY HISTORY:  Noncontributory.   REVIEW OF SYSTEMS:  As described in HPI.  All other systems reviewed and  were negative.   ALLERGIES:  No known drug allergies.   MEDICATIONS:  1. Multivitamin daily.  2. Metoprolol 50 mg p.o. b.i.d.  3. Hydrochlorothiazide 25 mg p.o. daily.  4. Norvasc 5 mg p.o. daily.  5. Plavix 75 mg p.o. daily.  6. Aspirin 81 mg p.o. daily.  7. Crestor 5 mg p.o. daily.   PHYSICAL EXAMINATION:  VITAL SIGNS:  Temp 97.5 degrees Fahrenheit, pulse  71,  BP 123/67, respiration rate 22, O2 saturation 95% on room air.  GENERAL:  On exam, he is alert and oriented x3 in no apparent distress,  speaking and moving easily without respiratory difficulty during exam.  HEENT:  Head was normocephalic and atraumatic.  Pupils were equal,  round, and reactive to light.  Extraocular muscles were intact.  Nares  were patent without discharge.  Oropharynx was without erythema or  exudate.  NECK:  Supple without thyromegaly.  No JVD.  No bruits.  No  lymphadenopathy noted on exam.  HEART:  Rate regular with audible S1 and S2.  No murmurs, rubs, gallops,  or clicks.  Pulses were 2+ in right lower extremity and absent in left  lower extremity.  Popliteal pulse on the left side 2+.  LUNGS:  Clear to auscultation bilaterally.  SKIN:  No rashes, lesions, or petechiae.  ABDOMEN:  Soft, nontender.  Normal abdominal bowel sounds.  No  hepatosplenomegaly and no pulsations.  EXTREMITIES:  No clubbing, cyanosis, or edema.  MUSCULOSKELETAL:  No joint deformity, effusions.  No spinal or CVA  tenderness.   NEUROLOGIC:  Cranial nerves II through XII grossly intact.  Strength 5/5  in all extremities.  Axial groups, normal sensation throughout.  Normal  cerebellar function.   Chest x-ray, no acute cardiopulmonary disease.   CT of the head without contrast, no acute intracranial findings.  EKG  showed a sinus rhythm with a rate of 59.  Normal axis.  No acute ST-T  wave changes.  Possible Q waves in aVL with no evidence of hypertrophy  and no prior EKG for comparison.  PR interval is 180, QRS was 84, and  QTc was 416.   LABORATORY DATA:  WBC 10.3,  hgb of 13.9, hct 42.3.  Sodium 142,  potassium 4.4, chloride 101, CO2 of 25, BUN 23, creatinine 1.20, glucose  110.  D-dimer 0.80.  BNP 124.  First set of cardiac enzymes negative.  CK 73, CK-MB 2.1, troponin-I less than 0.01.  PTT 23, PT 12.6, INR 0.9.   ASSESSMENT AND PLAN:  The patient with known history of coronary artery  disease status post coronary artery bypass graft in 1993 and most recent  catheterization in November 2008, now presents with sharp fleeting left  chest pain followed with substernal chest pressure same as prior anginal  equivalent.  We will admit for unstable angina, rule out MI overnight,  titrate meds.  Dr. Riley Kill to see the patient a.m. to determine further  risk stratification.  The  patient will continue on his home medications for his other medical  problems such as hypertension, hyperlipidemia.  His glucose will be  monitored for his diabetes and we will start ADA diet, moderate  calories.  Renal insufficiency will continue to be followed.      Jarrett Ables, [facility]      Hillis Range, MD  Electronically Signed    MS/MEDQ  D:  10/15/2008  T:  10/16/2008  Job:  [id number]

## 2011-02-14 NOTE — Cardiovascular Report (Signed)
NAME:  Brendan Arroyo, Brendan Arroyo NO.:  [id number]   MEDICAL RECORD NO.:  [id number]          PATIENT TYPE:  INP   LOCATION:  3734                         FACILITY:  [facility]   PHYSICIAN:  [name]. Riley Kill, MD, FACCDATE OF BIRTH:  1929-02-21   DATE OF PROCEDURE:  10/16/2008  DATE OF DISCHARGE:                            CARDIAC CATHETERIZATION   INDICATIONS:  Mr. Naill is a 77 year old gentleman who presents with  some exertional chest tightness.  He is well known to me in 1985.  He  had an angioplasty in 1993.  He underwent revascularization surgery with  a vein graft to the distal right internal mammary to the LAD diagonal  system.  Subsequent to this, he has had stenting of the distal right  vein graft at its insertion into the native vessel, and he has also had  percutaneous intervention of the native circumflex a year ago.  This  started out as sharp chest discomfort, somewhat atypical.  His enzymes  were negative.  However, he then developed what sounded like more  typical symptoms and he has been fairly reliable in the past.  The  current study was done to assess coronary anatomy.   PROCEDURE:  1. Left heart catheterization.  2. Selective coronary arteriography.  3. Selective left ventriculography.  4. Saphenous vein graft angiography.  5. Selective left internal mammary angiography.   DESCRIPTION OF PROCEDURE:  The patient was brought to the  Catheterization Laboratory and prepped and draped in the usual fashion  through an anterior puncture.  The left femoral artery was entered.  A 5-  French sheath was then placed.  Following this, views of the left  coronary artery were obtained.  Following this, RCA angiography was  performed.  We used a right bypass catheter to engage the vein graft to  the right and an internal mammary catheter to engage the left internal  mammary.  Following this, central aortic and left ventricular pressures  were measured and a hand  injection was done into the LV to reduce  contrast load.  There were no complications.  I compared this to the old  studies in careful detail and I elected to recommend medical treatment.   HEMODYNAMIC DATA:  1. The central aortic pressure is 152/61, mean 93.  2. Left ventricular pressure 164/5.  3. There was no gradient on pullback across aortic valve.   ANGIOGRAPHIC DATA:  1. The left main is free of critical disease.  2. The left anterior descending artery is totally occluded after a      small diagonal.  3. The internal mammary to the diagonal and distal LAD appears to be      intact, with good runoff into both branches.  There is disease      between the 2 branches.  4. The native circumflex is calcified.  There is a first marginal that      has about 40% ostial narrowing, but is fairly small in caliber.      The AV circumflex after the takeoff of the second marginal is      previously stented and  there is no evidence of any significant      restenosis at this site.  The second marginal itself has an      eccentric plaque of about 60% noted in the midportion of that      artery.  It has been previously visualized, and compared to      previous study, I do not observe a substantial change.  Distally,      the AV circumflex has about 50% eccentric bend leading into the      fairly large third marginal branch.  5. The right coronary artery has a 50-60% mid stenosis.  The vessel is      then diffusely diseased distally, and there is competitive filling.  6. The vein graft at its inserts into the distal right covering the PD      and PLA system appears to be widely patent.  The stent itself is      patent as well.  There is maybe 40% in the continuation branch.  7. The ventriculogram by hand only suggests preserved LV function.   CONCLUSION:  1. Preserved overall left ventricular function.  2. Continued patency of the internal mammary to the diagonal LAD.  3. Continued patency of  the saphenous vein graft to the distal right      with continued patency of the stent at its insertion.  4. Continued patency of the stent to the mid circumflex.  5. Moderate lesions of the OM-2 and distal AV circumflex as described      above.   DISPOSITION:  I have carefully reviewed the old films and compared them  to the current studies.  The circumflex OM lesions do not appear to be  substantially progressed.  It potentially could be the source of  ischemia, but does not appear to be unstable as such, given the current  anatomy I am inclined to treat him medically at the present time.  We  will see how he does first and I will see him back in followup in the  office soon.      [name]. Riley Kill, MD, [facility]  Electronically Signed     TDS/MEDQ  D:  10/16/2008  T:  10/16/2008  Job:  [id number]

## 2011-02-14 NOTE — Assessment & Plan Note (Signed)
[facility]                            CARDIOLOGY OFFICE NOTE   NAME:Coonradt, FINN AMOS                     MRN:          [id number]  DATE:08/27/2007                            DOB:          1929/05/04    Mr. Esterline is in for a followup visit.  He recently presented to the  hospital with increasing angina.  He underwent stenting of the  circumflex coronary artery.  He had a new high grade lesion with 75-80%  hazy stenosis that was progressed from the previous study.  Following  stenting this was reduced to 0% with no evidence of edge tear, and there  was a good looking vessel.  A drug-eluting stent was placed and he was  placed on the Adapta ES trial.   Since discharge from the hospital he has had no recurrent symptoms.  His  musculoskeletal symptoms have improved off of Zocor but he wants to try  another agent.   On examination today, the blood pressure is 130/56, the pulse is 57, the  lung fields are clear and the cardiac rhythm is regular.  There is no  definite murmur.  The groin is well healed, there are soft bifemoral  bruits.   The electrocardiogram demonstrates sinus bradycardia with nonspecific T  wave abnormality.   IMPRESSION:  1. Coronary disease with prior revascularization surgery.  2. Hypertension.  3. Mild renal insufficiency.  4. History of recurrent gout.   PLAN:  1. Return to clinic in 6 weeks.  2. Continue current medical regimen.  3. We discussed various options with regard to the cholesterol-      lowering agents and he is willing to try low-dose Crestor but will      need a lipid and liver profile.  4. [place]  I      preferably would like to keep him here, but he is pretty intent on      leaving and, therefore, followup will need to be obtained there.      He will see [place] back in 5-6 months.     [name]. Riley Kill, MD, [facility]  Electronically Signed    TDS/MedQ  DD:  08/27/2007  DT: 08/27/2007  Job #: [id number]

## 2011-02-14 NOTE — Letter (Signed)
Feb 10, 2008    Brendan Arroyo, M.D.  [address]  [place]  [place]  [address]   RE:  Brendan Arroyo, Brendan Arroyo  MRN:  [id number]  /  DOB:  [birth year]   Dear Marolyn Haller,   I had the pleasure of seeing Brendan Arroyo, in the office today, in  followup.  Cardiac-wise he seems to be doing beautifully.  Perhaps more  importantly, he has continued to lose weight, and is now down from a  peak of 217 to 187.  He feels better, and generally has gotten along  well.  I was excited to his lipid profile with a total cholesterol of  122, LDL of 44, and an HDL of 50.  Glucose was 112, BUN and creatinine  were relatively normal, and a potassium of 5.0 on that.  He has also got  a hemoglobin A1c of 6.0.   MEDICATIONS INCLUDE:  1. Omeprazole 20 mg daily.  2. Multivitamin 1 daily.  3. Metoprolol 50 mg b.i.d.  4. Hydrochlorothiazide 25 mg daily.  5. Zetia 10 mg daily.  6. Amlodipine 5 mg daily.  7. Plavix 75 mg daily.  8. Aspirin 325 mg daily.   PHYSICAL EXAMINATION:  He is alert and oriented in no distress.  Blood pressure is 150/60.  The pulse is 56.  The lung fields are clear.  The cardiac rhythm is regular.  EXTREMITIES:  Reveal no edema.   ELECTROCARDIOGRAM:  Demonstrates sinus bradycardia, otherwise  unremarkable.   SUMMARY:  He seems to be doing exceedingly well.  He is looking for  better deal on his medications.  We also discussed the possibility of  discontinuing his omeprazole, as he says he does not really feel like he  needs it; and there is also, as you know, a reported interaction with  Plavix.  He did question about how long he would need to be on Plavix.  I mentioned to him a minimum of a year; but personally, given his  underlying situation, I would probably remain on this as well.   I appreciate the opportunity of sharing in his care, and we will see him  back in followup in 6 months.    Sincerely,      [name]. Riley Kill, MD, [facility]  Electronically Signed    TDS/MedQ  DD:  02/10/2008  DT: 02/10/2008  Job #: [id number]

## 2011-02-14 NOTE — Cardiovascular Report (Signed)
NAMEBUELL, PARCEL NO.:  [id number]   MEDICAL RECORD NO.:  [id number]          PATIENT TYPE:  INP   LOCATION:  2807                         FACILITY:  [facility]   PHYSICIAN:  [name]. Riley Kill, MD, FACCDATE OF BIRTH:  [birth year]   DATE OF PROCEDURE:  08/21/2007  DATE OF DISCHARGE:                            CARDIAC CATHETERIZATION   INDICATIONS:  [name] is a 77 year old gentleman well-known to me.  He  has previously undergone revascularization surgery.  In 2005, he had a  stent placed in the distal portion of the saphenous vein graft at its  insertion to the right coronary artery.  He has done well but developed  recurrent symptoms that he said are highly suggestive of recurrent  cardiac symptomatology that he has had an the past.  At the time of his  previous catheterization, he had a moderately severe lesion in the  circumflex coronary artery.  After discussion today, recommendation was  made to proceed to repeat cardiac catheterization with possible  percutaneous coronary intervention.   PROCEDURES:  1. Left heart catheterization  2. Selective coronary arteriography.  3. Selective left ventriculography.  4. Saphenous vein graft angiography.  5. Selective left internal mammary angiography.  6. Percutaneous stenting of the obtuse marginal branch of the      circumflex coronary artery.   DESCRIPTION OF PROCEDURE:  The patient was brought to the  catheterization laboratory, prepped and draped in usual fashion.  We  used the left groin because of some previous scarring in the right  groin.  A 5-French sheath was initially placed.  We took views of the  left and right coronary arteries.  The vein graft was injected, as was  the internal mammary.  We did use a Glidewire to get up to the  subclavian and used an internal mammary for the mammary injection.  After this, central aortic and left ventricular pressures were measured  with a pigtail and  ventriculography was performed in the RAO projection.  We felt that we needed to see the circumflex better.  Following this,  the sheath was upgraded to a 6-French sheath.  We used a JL-4 guiding  catheter and got better opacification of the circumflex.  Dr. Gala Romney  and I reviewed the films, and we were worried based on the symptoms of a  possible ruptured plaque in the circumflex, and I did compare this  carefully to the previous study.  The previous study had also  demonstrated some hypodensity.  After thorough discussion, we elected to  recommend a percutaneous intervention.  The patient was agreeable.  Bivalirudin was given according to protocol.  The patient had received  chewable aspirin at the beginning of the procedure.  Clopidogrel 300 mg  was given as the patient was on a chronic clopidogrel dosing.  We then  used a 4-Voda catheter to get better seeding in the circumflex.  The  lesion was crossed with a BMW wire.  Predilatation was done after  adequate anticoagulation with bivalirudin.  Using a 2.5 x 12 Maverick  balloon, we were able to open the lesion.  We  then tried to deploy a 3.0  x 18 Promus drug-eluting stent.  We could not get it down around the  bend with a single wire.  The stent was then removed.  A Luge wire was  placed as a buddy wire system, and then the stent was passed over the  Luge itself.  The buddy BMW wire was then removed and the stent  carefully placed.  Following this, the stent was deployed at 16  atmospheres.  Post dilatation was then done with a 3.75 Quantum Maverick  balloon with dilatations up to 10 11 atmospheres throughout the course  of the stent.  There was marked improvement in the appearance of the  stent after removal of all wires.  The procedure was completed.  We did  give him some hydralazine to bring down blood pressure.  He tolerated  the procedure well.   HEMODYNAMIC DATA:  1. Central aortic pressure 181/65, mean 109.  2. Left  ventricular pressure 184/59.  3. No gradient on pullback across aortic valve.   ANGIOGRAPHIC DATA:  1. Ventriculography done in the RAO projection reveals vigorous      systolic function.  No segmental wall motion abnormalities or      contraction were identified.  Ejection fraction was in excess of      65%.  2. The right coronary artery demonstrates some diffuse luminal      irregularity with probably about 50% narrowing in the proximal mid      vessel.  There is competitive filling distally.  3. The saphenous vein graft to the distal right coronary artery      inserting in just before the PDA appears to be widely patent, as      does the stent leading into this area.  There is vigorous flow into      both the PDA and posterolateral without significant focal      narrowing.  4. The left main is free of critical disease.  5. The LAD is totally occluded after a small diagonal was diffusely      diseased proximally.  6. The internal mammary sequentially to the diagonal and LAD appears      to be widely patent.  There is disease between the two locations.      There is vigorous runoff into the distal vessel.  7. The circumflex provides the first marginal with about 50%      narrowing.  The vessel itself is calcified.  There is another      second marginal with 40% narrowing and just after this is a 75-80%      hazy stenosis that was on the previous study.  On the present study      it looks slightly worse.  Following stenting and balloon      dilatation, this is opened up to 0% residual luminal narrowing with      an excellent angiographic appearance.  No evidence of edge tear.      There is about a 40% area of narrowing somewhat more distal to the      stent probably at about 10-15 mm beyond the stent edge.  The      posterolateral branch is quite large.   CONCLUSION:  1. Well-preserved left ventricular function.  2. Continued patency of the internal mammary to the diagonal and  LAD.  3. Continued patency of the drug-eluting stent to the distal right      vein graft at its insertion into  the right coronary artery.  4. High-grade stenosis of the circumflex with successful percutaneous      stenting of this vessel.   PLAN:  The patient will be treated medically.  Aspirin and Plavix will  be recommended.  Continued risk factor reduction is warranted.      [name]. Riley Kill, MD, [facility]  Electronically Signed     TDS/MEDQ  D:  08/21/2007  T:  08/22/2007  Job:  [id number]

## 2011-02-17 NOTE — Discharge Summary (Signed)
NAME:  Brendan Arroyo, Brendan Arroyo                        ACCOUNT NO.:  [id number]   MEDICAL RECORD NO.:  [id number]                   PATIENT TYPE:  INP   LOCATION:  6531                                 FACILITY:  [facility]   PHYSICIAN:  Duke Salvia, M.D.               DATE OF BIRTH:  [birth year]   DATE OF ADMISSION:  04/11/2004  DATE OF DISCHARGE:  04/13/2004                           DISCHARGE SUMMARY - REFERRING   PROCEDURES:  Coronary angiogram/stenting right coronary artery on April 12, 2004.   REASON FOR ADMISSION:  Please refer to dictated admission note.   LABORATORY DATA:  Normal serial cardiac markers.  BUN 26, creatinine 1.3 at  discharge (peak 34/1.3).  Potassium 4.4 at discharge.  Hemoglobin 11.4,  hematocrit 33.6, platelets 326 at discharge.  CPK 69/1.3 (post PCI).  Lipid  profile:  Total cholesterol 183, triglycerides 152, HDL 38, LDL 115.  TSH  1.9.  Liver enzymes normal.   Admission chest x-ray:  NAD.   HOSPITAL COURSE:  Following direct admission from the office for treatment  of symptoms suggestive of unstable angina pectoris, the patient was  maintained on a regimen consisting aspirin, beta blocker, intravenous  nitroglycerin, and heparin.   Serial cardiac markers were all within normal limits.   The patient was referred for diagnostic coronary angiography, performed by  Dr. Charlies Constable the following day (see report for full details), revealing  widely patent grafts with a 90% distal RCA lesion (after anastomosis).  There was also a 70 to 80% mid circumflex lesion.  There was no significant  LAD disease after anastomosis.  LV gram was deferred (secondary to renal  insufficiency).   The patient underwent successful stenting (Taxus) of the distal RCA lesion,  to less than 10% residual stenosis.  There were no noted complications.   The patient was pre-treated with sodium bicarbonate infusion prior to the  procedure.  Post-PCI renal function was stable (BUN  26/creatinine 1.3).   The patient was cleared for discharge the following morning, hemodynamically  stable condition.  There were no noted complications of the right groin.  The patient was treated with Angioseal.  Of note, the patient did have soft  bilateral femoral bruits prior to admission.   Recommendations to proceed with a followup stress Cardiolite for assessment  of the mid-circumflex lesion, to be considered at time of office followup.   MEDICATION ADJUSTMENTS:  Addition of Plavix for at least six months.   DISCHARGE MEDICATIONS:  1. Plavix 75 mg daily (at least six months).  2. Imiprazole 20 mg daily.  3. Metoprolol 50 mg b.i.d.  4. Simvastatin 40 mg q.h.s.  5. ___________8 mg daily.  6. Hydrochlorothiazide 25 mg daily.  7. Aspirin 325 mg daily.  8. Felodipine 2.5 mg daily.  9. Nitrostat 0.4 mg p.r.n.   INSTRUCTIONS:  No heavy lifting, driving for two days;  maintain low  fat/cholesterol diet;  call the office if  there is any swelling/bleeding of  the groin.   FOLLOWUP:  The patient will follow up with Dr. Shawnie Pons on Friday,  April 29, 2004, at 11:30 a.m.   DISCHARGE DIAGNOSES:  1. Unstable angina pectoris/coronary artery disease progression.     a. Normal serial cardiac markers.     b. Status post stent (Taxus), 95% distal right coronary artery (after        anastomosis).     c. Residual 70 to 80% circumflex.     d. Patent grafts.     e. Status post three vessel coronary artery bypass grafting in 1993.     f. History of normal left ventricular function.  2. Dyslipidemia.  3. Type 2 diabetes mellitus.  4. Tobacco.  5. Hypertension.  6. Gastroesophageal reflux disease.      Gene Serpe, P.A. [facility]                      Duke Salvia, M.D.    GS/MEDQ  D:  04/13/2004  T:  04/13/2004  Job:  [id number]   cc:   Delaney Meigs, M.D.  [address]  [place]  [place] [address]  Fax: [phone]

## 2011-02-17 NOTE — Cardiovascular Report (Signed)
NAME:  KACETON, VIEAU                        ACCOUNT NO.:  [id number]   MEDICAL RECORD NO.:  [id number]                   PATIENT TYPE:  INP   LOCATION:  6599                                 FACILITY:  [facility]   PHYSICIAN:  Charlies Constable, M.D. [facility]              DATE OF BIRTH:  [birth year]   DATE OF PROCEDURE:  04/12/2004  DATE OF DISCHARGE:                              CARDIAC CATHETERIZATION   CLINICAL HISTORY:  Mr. Umar is 77 years old, and had bypass surgery in  1993.  He has done well since that time, but over the last few days has  developed symptoms of angina at rest.  He was admitted yesterday and  scheduled for evaluation and angiography today.   PROCEDURE:  The procedure was performed via the right femoral artery using  an arterial sheath and 6-French preformed coronary catheters.  A front-wall  arterial punch was performed, and Omnipaque contrast was used.  A right  bypass graft catheter was used for injection of the vein graft to the right  coronary artery, and a LIMA catheter was used for injection of the LIMA  graft.  No left ventriculogram was done to save contrast.   After the patient had the diagnostic study, we made a decision to proceed  with intervention on the distal right coronary artery through the vein  graft.  The patient was given Angiomax bolus infusion and 300 mg of Plavix.  We used a right saphenous vein bypass graft guiding catheter with side  holes, 6-French, and a soft, Asahi wire.  We crossed the lesion with the  wire without difficulty.  We pre dilated with a 3.0 x8 mm Quantum Maverick  performing three inflations up to 8 atmospheres for 30 seconds.  We then  deployed a 2.75 x16 mm Taxus stent, deploying this with one inflation of 14  atmospheres for 25 seconds.  We then post-dilated with a 3.25 x12 mm Quantum  Mavik performing two inflations up to 16 atmospheres for 30 seconds,  avoiding the distal edge.  Repeat diagnostic study was then performed  through the guiding catheter.  The patient tolerated the procedure well and  left the laboratory in satisfactory condition.   RESULTS:  1. The aortic pressure was 182/70 with a mean of 111.  2. LEFT MAIN:  The left main coronary artery was free of significant     disease.  3. LEFT ANTERIOR DESCENDING ARTERY:  The left anterior descending artery was     completely occluded near its origin.  4. CIRCUMFLEX ARTERY:  The circumflex artery gave rise to two marginal     branches and a posterolateral branch.  There was 70 to 80% segmental     stenosis after the second marginal branch.  5. RIGHT CORONARY ARTERY:  The right coronary artery was a moderate size     vessel that gave rise to a right ventricular branch and then had  competing flow distally from the vein graft.  There was 70% narrowing in     the mid portion of the native artery.  6. The saphenous vein graft to the distal right coronary artery was patent.     There was 90% stenosis distal to the anastomosis just before the takeoff     of the posterior descending branch.  7. The LIMA graft to the diagonal branch of the LAD and the LAD was patent     and functioned normally.  8. No left ventriculogram was performed.  9. Following stenting of the lesion in the distal right coronary artery,     through the vein graft, the stenosis improved from 90% to less than 10%.   CONCLUSION:  1. Coronary artery disease status post coronary artery bypass graft surgery     in 1993.  2. Severe native vessel disease with total occlusion of the left anterior     descending artery, 70% stenosis of the mid right coronary artery, and 70-     80% stenosis of the mid circumflex artery.  3. Patent vein graft to the distal right coronary artery, but 90% stenosis     in the distal right coronary after the vein-graft insertion site, and a     patent LIMA graft to the diagonal branch of the LAD and the LAD.  4. Successful stenting of the lesion in the right  coronary artery through     the vein graft with a TAXUS stent with improvement in the percentage of     narrowing from 90 percent to less than 10 percent.   DISPOSITION:  Patient __________, for further observation.  He will need to  be on Plavix for a year.                                               Charlies Constable, M.D. [facility]    BB/MEDQ  D:  04/12/2004  T:  04/13/2004  Job:  [id number]   cc:   Delaney Meigs, M.D.  [address]  [place]  [place] [address]  Fax: [phone]   [name]. Riley Kill, M.D. [facility]   Cardiopulmonary Lab

## 2011-02-17 NOTE — Assessment & Plan Note (Signed)
[facility]                            CARDIOLOGY OFFICE NOTE   NAME:Brendan Arroyo, Brendan Arroyo                     MRN:          [id number]  DATE:09/10/2006                            DOB:          1929-09-28    [name] is in for followup. He really is quite stable. He is not  having any problems. He has been seen by Dr. Lysbeth Galas, and Weldon Picking has been  added to his regimen. The patient actually feels well.   His medications include:  1. Omeprazole 20 mg daily.  2. Multivitamin daily.  3. Metoprolol 50 mg b.i.d.  4. Hydrochlorothiazide 25 mg daily.  5. Plavix 75 mg daily.  6. Felodipine 2.5 t.i.d.  7. Simvastatin 40 mg daily.  8. Zetia 10 mg daily.  9. Aspirin 81 mg daily.   PHYSICAL EXAMINATION:  The blood pressure is 144/64. The pulse is 52.  The lung fields are clear.  The cardiac rhythm is regular.  Extremities do not reveal significant edema.   Electrocardiogram demonstrates sinus bradycardia, otherwise  unremarkable.   Overall, the patient is markedly improved. He has underlying coronary  artery disease, but has continued to remain stable. He is not currently  having symptoms. We plan to see him back in followup in 6 months. Dr.  Lysbeth Galas will continue to monitor and treat his lipids.     Brendan Arroyo. Riley Kill, MD, [facility]  Electronically Signed    TDS/MedQ  DD: 09/10/2006  DT: 09/10/2006  Job #: [id number]

## 2011-02-17 NOTE — Discharge Summary (Signed)
NAME:  Brandt, Chaney                          ACCOUNT NO.:  [id number]   MEDICAL RECORD NO.:  [id number]                  PATIENT TYPE:   LOCATION:                                       FACILITY:  [facility]   PHYSICIAN:  Alvin C. Lowell Guitar, M.D.               DATE OF BIRTH:  11-12-1928   DATE OF ADMISSION:  04/23/2002  DATE OF DISCHARGE:  04/24/2002                                 DISCHARGE SUMMARY   PROBLEM LIST:  1. Acute renal insufficiency.  2. Coronary artery disease, status post angioplasty x3 and coronary artery     bypass graft in 1993.  3. Diabetes mellitus, type 2.  4. Hypertension, well controlled.  5. High cholesterol.  6. Gastroesophageal reflux.   MEDICATIONS AT DISCHARGE:  1. Hydrochlorothiazide 20 mg daily.  2. Gemfibrozil 600 mg p.o. b.i.d.  3. Simvastatin 40 mg p.o. q. h.s.  4. Metoprolol 50 mg p.o. b.i.d.  5. Prilosec 20 mg p.o. daily.  6. Avandia 4 mg p.o. daily.  7. Enteric-coated aspirin 325 mg p.o. daily.  8. Daily vitamin.  9. Lisinopril, this was to be held for 3-4 weeks to determine whether his     renal function would return to normal.   HISTORY:  This is a 78 year old male with no history of renal disease who  was found on April 10, 2002, at his routine physical, to have a creatinine of  1.3.  His creatinine over the next few weeks went up as far as 2.8 on April 22, 2002.  He had no symptoms to go along with any of this and the only  change in his medication was to stop his lisinopril.  Please see his  admitting history and physical for full admission details.   HOSPITAL COURSE BY PROBLEM LIST:  Problem #1:  Acute renal failure.  The  patient was admitted to the hospital on April 23, 2002, and his creatinine at  that time had dropped to 2.1.  He appeared to be euvolemic at the time and  an ultrasound showed no obstruction.  A urinalysis showed his urine to be  completely normal.  His urine output overnight was within normal limits and  his creatinine  on the day after admission had dropped further to 1.6.  His  fractional excretion of sodium showed no suggestion of prerenal etiology.  On April 24, 2002, it was determined that he had no reason to stay in the  hospital as there was no further workup to be done, and his kidneys had  nearly returned to normal.  He was discharged with instructions to follow up  with Dr. Excell Seltzer within the next few weeks to make sure this had resolved  completely.   Problem #2:  Other issues during this hospitalization were stable.     Christiane Ha A. Alfonse Alpers, M.D.  Mindi Slicker. Lowell Guitar, M.D.    JAP/MEDQ  D:  10/24/2002  T:  10/25/2002  Job:  [id number]

## 2011-02-17 NOTE — H&P (Signed)
NAME:  LETCHER, SCHWEIKERT NO.:  [id number]   MEDICAL RECORD NO.:  [id number]          PATIENT TYPE:  EMS   LOCATION:  MAJO                         FACILITY:  [facility]   PHYSICIAN:  Leonard Downing. Pernell Dupre, M.D.  DATE OF BIRTH:  1928-12-05   DATE OF ADMISSION:  07/30/2005  DATE OF DISCHARGE:                                HISTORY & PHYSICAL   PRIMARY MEDICAL DOCTOR:  Delaney Meigs, M.D.   ATTENDING PHYSICIAN:  Vida Roller, M.D.   CHIEF COMPLAINT:  Chest pain.   HISTORY OF PRESENT ILLNESS:  This is a 77 year old Caucasian male with a  history of CAD (three-vessel CABG and PCI in 2005), diabetes, hypertension,  hyperlipidemia, tobacco abuse, presents with chest pain. The patient states  last admission was in October 2005 for atypical chest pain with a nuclear  stress test ordered. No results in the computer and he cannot remember if he  had this done or not. The patient states three days ago he began having  chest pain described as pressure over the left breast, it comes and off at  times, at its worse it is a 9/10. He describes two types of chest pain. One  is a bleeding chest pain described as Zenger's, comes on and off, and then a  second type of chest pain that is a pressure, which is always there. The  patient states it is worse with exercise. Associated symptoms include  shortness of breath. No nausea or vomiting. No diaphoresis. No PND or  orthopnea. He does have chronic lower extremity edema. No syncope. The  patient states he is compliant with his medications. In the ER, he was  started on a nitroglycerin drip and it has helped decrease his pain, he  states.   ALLERGIES:  No known drug allergies.   MEDICATIONS:  1.  Plavix 75 mg daily.  2.  Baby aspirin 325 mg daily.  3.  Prilosec 20 mg daily.  4.  Metoprolol 50 mg b.i.d.  5.  Zocor 40 mg daily.  6.  Avandia 8 mg daily.  7.  Hydrochlorothiazide 25 mg daily.  8.  Lidocaine 2.5 mg daily.  9.  Sublingual  nitroglycerin.   PAST MEDICAL HISTORY:  1.  Mild chronic renal insufficiency, creatinine 1.3 per report.  2.  Type 2 diabetes mellitus.  3.  He is a smoker.  4.  Peripheral vascular disease. Last carotid stenosis of 60% to 8%.  5.  Gastroesophageal reflux disease.  6.  Coronary artery disease, status post PCI x3, CABG in 1993 with LIMA to      LAD and D-1, SVG  to the distal RCA, and Taxus stent to the distal RCA      via SVG in July 2005 with residual left circumflex 70% to 80% after OM-2      left unvascularized. The rest of his native vessels had total occlusion      of the LAD, 70% mid RCA.  7.  Hypertension.  8.  Hyperlipidemia.  9.  Back pain, status post lumbar laminectomy.  10. Enlarged hilar nodes by chest CT thought  to be reactive.   SOCIAL HISTORY:  Lives in [place] with his wife, 49 years. Retired from  [employer] approximately 15 years ago. Now smoking approximately  3/4 packs a day. Occasional alcohol, no drugs.   FAMILY HISTORY:  Mother died at 33 of uterine cancer. She also had diabetes  and hypertension. Father died at age the 76. Brother is alive at 35 with  diabetes.   REVIEW OF SYSTEMS:  Unremarkable except for the chest pain and shortness of  breath described above.   PHYSICAL EXAMINATION:  VITAL SIGNS: Afebrile at 97.7, pulse 88, respiratory  rate 20, BP 183/84.  GENERAL: Alert and oriented times three, in no apparent distress.  HEENT: No increased JVP. No carotid bruits. No thyromegaly.  CARDIOVASCULAR: Positive S4. Normal S1 and S2. No murmurs, rubs, or gallops.  CHEST: Clear to auscultation bilaterally.  ABDOMEN: Soft, nontender, nondistended. Positive bowel sounds. No  hepatojugular reflux.  EXTREMITIES: No clubbing, cyanosis,  trace to 1+ bilateral extremity edema.  He has good pulses.  NEUROLOGIC: Alert and oriented times three. Cranial nerves II-XII are  grossly intact.   Chest x-ray reveals cardiomegaly with some slight pulmonary  vascular  redistribution. He does have sternal wires from his CABG.  EKG shows sinus,  rate 90, no significant change from October 2005. Axis is 65, PR 190, QRS  86, QTc 408.   Labs which are back currently reveal H&H of 12.6/37, sodium 140, potassium  3.8, glucose 108. Coags normal. PT 24, PT 12.2, INR 0.9. ABG show 7.39, 41,  and 26.   ASSESSMENT:  A 77 year old Caucasian male with a history of history of CAD  with multiple risk factors for CAD who presents with chest pain.  1.  Chest pain. Patient with chest pain with typical and atypical features.      Rule out two sets of cardiac enzymes. Place on telemetry. Continue      nitroglycerin drip which was started on in the ER.  Place on heparin drip. Continue aspirin, Plavix, beta blockers, statin,  felodipine. Patient rules out and has not had a stress test, which we will  check in the office. Will pursue stress test. If he rules in and then  perform a cardiac catheterization.  1.  Diabetes mellitus. The patient is on sliding scale insulin. Will check a      hemoglobin A1c, continue Avandia, and check a TSH.  2.  Hyperlipidemia. Check a lipid panel, continue statin, check LFTs.  3.  Hypertension. Continue beta blocker, hydrochlorothiazide, felodipine,      and nitroglycerin drip which was started for pain control.  4.  Gastrointestinal. Continue with Prilosec.           ______________________________  Leonard Downing. Pernell Dupre, M.D.     Loralie Champagne  D:  07/30/2005  T:  07/30/2005  Job:  [id number]

## 2011-02-17 NOTE — Discharge Summary (Signed)
NAME:  Brendan Arroyo, Brendan Arroyo NO.:  [id number]   MEDICAL RECORD NO.:  [id number]          PATIENT TYPE:  INP   LOCATION:  3729                         FACILITY:  [facility]   PHYSICIAN:  Salvadore Farber, M.D. LHCDATE OF BIRTH:  April 19, 1929   DATE OF ADMISSION:  07/30/2004  DATE OF DISCHARGE:  08/01/2004                           DISCHARGE SUMMARY - REFERRING   DISCHARGE DIAGNOSES:  1.  Chest pain.  2.  Known coronary artery disease.  3.  Chronic renal insufficiency with a baseline creatinine of 1.4.  4.  Elevated D-dimer with a negative chest CT for pulmonary embolism.  5.  Enlarged hilar and mediastinal lymph nodes, he needs to have a follow-up      CT scheduled at next visit.  6.  Diabetes mellitus, oral agents.  7.  Hyperlipidemia on Zocor.  8.  Hypertension, treated.   HISTORY OF PRESENT ILLNESS:  The patient is a 77 year old male patient who  is admitted with substernal chest pain that was felt to be atypical on  July 30, 2004.   HOSPITAL COURSE:  He ruled out for myocardial infarction by enzymes.  His  EKG revealed normal sinus rhythm, rate 64, with no specific ST T wave  changes.  A CT of the chest was performed secondary to elevated D-dimer.  The CT revealed no pulmonary embolism, but there were mildly enlarged hilar  and mediastinal lymph nodes and a  recommended CT was asked for completion  within one month.   Because the patient does have known coronary artery disease and has  undergone percutaneous interventions to the right coronary artery as well as  bypass in 1993, we felt that the patient would benefit from a rest stress  Cardiolite.  This has been scheduled for August 11, 2004, at 8:45 a.m.   He is to otherwise continue his home medications including Plavix 75 mg a  day, enteric-coated aspirin 325 mg a day, Prilosec 20 mg a day, metoprolol  50 mg b.i.d., Zocor 40 mg q.h.s., Avandia 8 mg a day, HCTZ 25 mg a day,  Felodipine 2.5 mg a day,  sublingual nitroglycerin p.r.n. chest pain,  multivitamin daily, and Tylenol as needed.  He may utilize sublingual  nitroglycerin as needed for chest pain.  No straining, lifting, exercising,  or aerobic activity  until stress test results are final.  He is to remain on a low fat, diabetic  diet.  Call for questions or concerns at [phone] and stress test has been  setup and following the stress test, he will have an appointment to see  [name]. Riley Kill, M.D. [facility]       LB/MEDQ  D:  08/01/2004  T:  08/01/2004  Job:  [id number]   cc:   Delaney Meigs, M.D.  [address]  [place]  [place] [address]  Fax: [phone]

## 2011-02-17 NOTE — H&P (Signed)
NAME:  Brendan Arroyo, Brendan Arroyo NO.:  [id number]   MEDICAL RECORD NO.:  [id number]          PATIENT TYPE:  INP   LOCATION:  3729                         FACILITY:  [facility]   PHYSICIAN:  Verne Grain, MD   DATE OF BIRTH:  1928/12/23   DATE OF ADMISSION:  07/30/2004  DATE OF DISCHARGE:                                HISTORY & PHYSICAL   PRIMARY CARDIOLOGIST:  [name]. Riley Kill, M.D.   PRIMARY CARE PHYSICIAN:  Delaney Meigs, M.D. (family practice in  [place], [place]   CHIEF COMPLAINT:  Chest pain (atypical).   HISTORY OF PRESENT ILLNESS:  A 77 year old male with mild chronic renal  insufficiency (creatinine 1.3), hypertension, hyperlipidemia, type 2  diabetes mellitus, former smoker (quit May 2005), left carotid artery  stenosis (60-80), gastroesophageal reflux disease, coronary artery disease,  status post PCI x3, and three-vessel CABG in 1993 (LIMA to the LAD/D1, SVG  to the [facility]), status post Taxus stent to distal RCA via saphenous vein graft  (July 2005) by Dr. Juanda Chance with notation on angiogram of residual left  circumflex 70-80% after OM-2 left unrevascularized, noted episode of trip  and fall one week ago resulting in knee/back/elbow pain treated with  Tylenol, doing fairly well until midnight last night, experiencing onset of  chest discomfort with mild shortness of breath.  No nausea, no vomiting, no  diaphoresis, no radiation in the pain.  The pain was worse with deep  inspiration and coughing.  The patient took one nitroglycerin but had no  improvement.  He subsequently went to sleep.  However, this morning he  continued to have discomfort in his chest that was worse with inspiration  and coughing and also reproducible with palpation and he reported to the  emergency room where he was started on a nitroglycerin drip again with no  improvement in his symptoms.  He subsequently was treated with an injection  of morphine which did improve his chest  discomfort but did not completely  relieve it.  The patient reports having chest discomfort, a 2-3/10, constant  since last night with increase in discomfort to 8/10 with deep breaths,  cough or palpation.  The patient with his continuous pain has had cardiac  markers that are negative x2.  His EKG is negative.  His room air saturation  was 98% with no tachypnea. The patient's chest x-ray was reported as  cardiomegaly with vascular congestion and right lower lobe infiltrate.  The patient does have a mild nonproductive cough as noted above although he  specifically denies fevers, chills.  Laboratory in the office reveals no  leukocytosis.   PAST MEDICAL HISTORY:  1.  Coronary artery disease, status post angioplasty x3 and three-vessel      CABG in 1993 (LIMA to LAD/D1, saphenous vein graft to the distal RCA),      status post distal RCA stent via saphenous vein graft by Dr. Juanda Chance July      2005, 2.75 x 16 mm Taxus stent with notation of the remainder of the      patient's coronary anatomy, left main, with no significant  blockage, LAD      100% proximal, left circumflex 70-80% stenosis after the OM-2, right      coronary artery 70% mid, LIMA to the LAD/D1 patent, saphenous vein graft      to the distal RCA with a 90% lesion noted in the right coronary artery      distal to vein graft insertion but proximal to the PDA with this lesion      subsequently stented with Taxus stent described above.  2.  Hyperlipidemia.  3.  Hypertension.  4.  Type 2 diabetes mellitus.  5.  History of tobacco, quit.  The patient reports he quit for approximately      16 years but then earlier this year, started smoking occasionally.  He      then subsequently quit again Feb 25, 2004 (on his birthday) and has not      smoked a cigarette since per his report.  6.  Mild chronic renal insufficiency with a history of acute on chronic      renal insufficiency (baseline creatinine 1.3).  7.  History of left carotid  artery stenosis (60-80% noted in 2003).  8.  History of gastroesophageal reflux disease on chronic PPI therapy.  9.  History of back pain status post lumbar laminectomy.   ALLERGIES/ADVERSE REACTIONS:  No known drug allergies.   CURRENT MEDICATIONS:  1.  Plavix 75 mg p.o. daily.  2.  Aspirin 325 mg p.o. daily.  3.  Prilosec 20 mg p.o. daily.  4.  Metoprolol 50 mg p.o. b.i.d.  5.  Zocor 40 mg p.o. at night.  6.  Avandia 8 mg p.o. daily.  7.  Hydrochlorothiazide 25 mg p.o. daily.  8.  Felodipine 2.5 mg p.o. b.i.d.  9.  Sublingual nitroglycerin p.o.  10. Tylenol p.r.n.  11. Multivitamin one tablet p.o. daily.   SOCIAL HISTORY:  The patient lives in [place] with his wife of 49 years.  His wife is disabled and cared for almost entirely by the patient.  He  performs assistance with her ADLs as well as all of his own ADLs and home  maintenance/cleaning/home upkeep.  The patient has one daughter who is  healthy at age 49.  The patient is retired from [employer].  He  reports retiring approximately 15 years ago.  The patient quit smoking  approximately 16 years ago; however, restarted earlier this year, but then  quit again on his birthday, [birth year] 2004 and has not smoked a cigarette  since per his report.  He uses alcohol rarely to occasionally.  He denies  any illicit drug use currently or in his past.   FAMILY HISTORY:  The patient's mother died at age 81 of uterine cancer but  also was noted to have diabetes and hypertension.  She did not have any  known coronary disease or other heart disease per the patient.  Father died  at age 3 years old of unknown causes.  The patient has one brother who is  alive at age 34 with type 2 diabetes mellitus who is followed by a  cardiologist and has had cardiac catheterization and no intervention or  known obstructive lesion that the patient is aware of.  REVIEW OF SYSTEMS:  The patient reports no fevers, chills, sweats, weight   change or adenopathy, no headache, no changes in auditory or visual acuity.  He has dentures, upper and lower.  No other dental complaints.  He did not  report any rash or  other skin lesions.  He has chest pain and shortness of  breath as described above but denies dyspnea on exertion or orthopnea, PND,  edema and palpitations, presyncope, syncope, claudication, or wheezing.  He  does have a mild nonproductive cough as mentioned above that aggravates his  chest pain as described in the HPI.  The patient has no bowel or bladder  complaint.  His neuropsychiatric status is stable.  He has no nausea,  vomiting, or diarrhea.  He does have some gastroesophageal reflux symptoms  treated with chronic proton pump inhibitor therapy.  He has no history of  heat or cold intolerance or hair changes to suggest endocrinologic  abnormality.  The patient does have some musculoskeletal joint pain that is  chronic with his history of back surgery as noted above and back/right  elbow/knee pain after his fall approximately one week ago.   PHYSICAL EXAMINATION:  VITAL SIGNS: Temperature 97.8, heart rate 50,  respiratory rate 20, blood pressure 154/61, oxygen saturation 98% on room  air.  GENERAL:  The patient is pleasant.  He is cooperative and in no apparent  distress.  HEENT: Normocephalic, atraumatic.  Extraocular movements are intact.  Pupils  are equal, round, and reactive to light.  His oropharynx is pink and moist  without lesions.  Upper and lower dentures are noted.  NECK:  Supple.  There is no evidence of bruit.  There is no evidence of  jugular venous distention.  CARDIOVASCULAR:  Regular S1 and a regular S2.  There is notation of a  systolic ejection murmur that is consistent with aortic sclerosis without  stenosis.  There is no radiation to the neck and murmur is not late peaking  and S2 is normal.  LUNGS:  Lung fields are clear to auscultation bilaterally.  The patient's  PMI is nondisplaced.   SKIN:  Small bruise on the medial aspect of the lateral aspect of his left  upper arm.  ABDOMEN:  Soft, nontender, nondistended.  Mildly obese with positive bowel  sounds.  EXTREMITIES:  Lower extremity examination reveals no evidence of edema.  Femoral pulses are 2+ and symmetric bilaterally.  There are no femoral  bruits appreciated.  NEUROLOGIC:  Grossly intact.  The patient is alert and oriented.  He is able  to move all four extremities without difficulty.  Strength and sensation  grossly intact throughout.   LABORATORY DATA:  Chest x-ray:  Cardiomegaly with vascular congestion and  right lower lobe infiltrate.  EKG:  Sinus rhythm at a rate of 65 with a  normal axis and normal intervals. There are no Q waves and no ischemic  changes.  There is no hypertrophy.  White blood cell count 7.5, hematocrit  33, platelet count 314, sodium 135, potassium 4.1, chloride 102, bicarb 27, BUN 32, creatinine 1.4, glucose 185, total bilirubin 0.4, AST 21, ALT 18,  alk phos 62, albumin 3.6, total protein 5.8, myoglobin 104/82, troponin-I  less than 0.05/less than 0.05.  CK-MB 1.9/1.5.  PT 12.1, INR 0.9.  Calcium  8.4.   ASSESSMENT/PLAN:  48.  A 77 year old male with coronary artery disease, status post      angioplasty x3 and three-vessel coronary artery bypass grafting in      1993, percutaneous coronary intervention to the distal right coronary      artery via the saphenous vein graft (July 2005) by Dr. Juanda Chance.  2.  Diabetes mellitus type 2.  3.  Hypertension.  4.  Hyperlipidemia.  5.  Former smoker.  6.  Mild chronic renal insufficiency (baseline creatinine 1.3, current      creatinine 1.4) with atypical chest pain, pleuritic in nature and      reproducible on palpation with nonproductive cough and report of      cardiomegaly with vascular congestion and right lower lobe infiltrate      on chest x-ray.  White blood cell count normal.  Afebrile.  CK-      MB/troponin negative x2.  EKG with no  ischemic changes.   PLAN:  1.  Atypical chest pain with notation of cough and right lower lobe      infiltrate as well as cardiomegaly with vascular congestion noted on      chest x-ray, history of tobacco, rule out myocardial infarction on      telemetry with EKG, aspirin, Plavix, beta blocker.  Will order an      echocardiogram in the morning to confirm that the patient's ejection      fraction continues to be normal.  Although he has no evidence of heart      failure on examination, they did comment on vascular congestion on his      chest x-ray which may be worthwhile following up.  It is unclear as to      whether or not there are echocardiograms that have been done in the      clinic.  However, there is no prior ejection fraction seen on review of      the patient's computer records at [facility] (ventriculogram was not      done with most catheterization in light of mild chronic renal      insufficiency with history of acute on chronic renal insufficiency).  If      the above studies are negative, would consider discharge with outpatient      stress test (for example treadmill, Cardiolite) to be obtained in      clinic.  In the meantime, we will also initiate therapy, empiric      Robitussin, incentive spirometry, moxifloxacin for presumed pneumonia      (cough with right lower lobe infiltrate on chest x-ray and pleuritic      chest pain).  Will recheck chest x-ray PA and lateral in the morning to      confirm existence of right lower lobe infiltrate after the above      therapies.  If the chest x-ray in the morning confirms infiltrate, the      patient can be treated with moxifloxacin as an outpatient for 10-14 days      with plans for followup chest x-ray in six to eight weeks to confirm      resolution of this infiltrate.  We will also recheck a CBC in the      morning to look for any increase in his white count as well as follow     temperatures and oxygen saturations overnight  with routine vital signs.   1.  Coronary artery disease, status post CABG/PCI.  Will continue on aspirin      81 mg p.o. daily, Plavix 75 mg p.o. daily, Metoprolol 25 mg p.o. q.6h.,      Zocor 40 mg p.o. at night.  The patient is known to have residual      unrevascularized left circumflex lesion.  However, he has no exertional      component or reproducible pattern in his pain and his pain is quite      atypical  with a setting of right lower lobe infiltrate and cough may be      secondary to pneumonia.  Therefore, would like to have a stress      perfusion imaging study may be useful prior to revascularization to      localize areas of ischemia if present presuming that the patient's above      evaluation is negative.   1.  Hyperlipidemia.  I will check lipid profile in the morning to confirm      that lipid profile is at goal (LDL less than 70 and triglycerides less      than 150).  Will continue on Zocor 40 mg p.o. at night.   1.  Hypertension.  Will continue with metoprolol, felodipine and      hydrochlorothiazide as previously prescribed.  Ideally, it would be nice      to have the patient on an ACE inhibitor, not only to optimize blood      pressure control and for his underlying coronary artery disease, though      for its renal protective effect with his diabetes mellitus.  It is      notable, however, that his potassium today is 4.1 on      hydrochlorothiazide.  Therefore, the initiation of this agent will be      deferred to the outpatient arena where prior experience with the      patient's potassium level and ACE inhibitor therapy may have been      investigated previously.   1.  Chronic renal insufficiency (creatinine 1.3 at baseline, 1.4 currently).      Will recheck creatinine in the morning.  ACE inhibitor for long-term      therapy would be ideal if the patient's potassium/renal function is able      to tolerate as described above.  Currently the patient appears to  be      best evaluated by an outpatient stress test.  I, therefore, will not      initiate IV fluids/Mucomyst at this time.  However, if cardiac      catheterization/PCI, will consider in the future treatment with IV      fluids/Mucomyst would be desirable.   1.  Type 2 diabetes mellitus.  Will check hemoglobin A1C in the morning to      assess adequacy of control on Avandia monotherapy.  Will treat the      patient with a diabetic diet and sliding scale insulin while in the      hospital.   1.  Gastroesophageal reflux disease. Will continue Prilosec as previously      prescribed.       DDH/MEDQ  D:  07/30/2004  T:  07/30/2004  Job:  [id number]

## 2011-02-17 NOTE — H&P (Signed)
[place]. [facility]  Patient:    Brendan Arroyo, Brendan Arroyo Visit Number: [id number] MRN: [id number]          Service Type: MED Location: [phone] Attending Physician:  Hans Eden Dictated by:   Dante Gang, M.D. Admit Date:  04/23/2002 Discharge Date: 04/24/2002   CC:         [facility]  care of Mindi Slicker. Lowell Guitar, M.D.   History and Physical  DATE OF BIRTH:  12/11/1928.  CHIEF COMPLAINT:  Acute renal insufficiency.  HISTORY OF PRESENT ILLNESS:  The patient is a 77 year old male with no history of renal disease who presented on April 10, 2002 to his primary physician for a routine physical.  At that time, he had a creatinine of 1.3.  He had repeat laboratory work drawn on April 15, 2002 that showed a creatinine of 1.4 and another repeat on April 21, 2002 showed a creatinine of 1.9.  One day later, his creatinine was up to 2.8.  He has had no symptoms to go along with any of this.  The only changes in his medication was to stop lisinopril on April 14, 2002, which he had been taking for quite some time and to start hydrochlorothiazide.  He has no other current complaints.  PAST MEDICAL HISTORY: 1. Coronary artery disease, status post angioplasty x 3 and CABG in 1993. 2. Diabetes mellitus type 2. 3. Hypertension well controlled. 4. High cholesterol. 5. Gastroesophageal reflux disease.  MEDICATIONS: 1. Daily vitamin. 2. Enteric-coated aspirin 325 mg p.o. q.d. 3. Avandia 4 mg p.o. q.d. 4. Prilosec 20 mg p.o. q.d. 5. Metoprolol 50 mg p.o. b.i.d. 6. Simvastatin 40 mg p.o. q.h.s. 7. Gemfibrozil 600 mg p.o. b.i.d. 8. Hydrochlorothiazide 20 mg p.o. q.d.  ALLERGIES:  No known drug allergies.  FAMILY HISTORY:  No family members with history of renal disease.  SOCIAL HISTORY:  [place]ives [place], [place].  His wife is disabled and he does most of the management of household duties.  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  He was a smoker for 30 years before quitting 15 years ago. He drinks occasionally alcohol and rarely more than a few drinks at a sitting.  He has no history of use of any other drugs.  REVIEW OF SYSTEMS:  Positive throbbing headache in the past few weeks.  No chest pain, shortness of breath, no cough, no fever or chills.  No abdominal pain.  No changes in bowel or bladder habits.  No blood per rectum.  No palpitations.  Positive occasional lower extremity swelling, very mild.  No rashes.  No joint pain.  No anxiety or depression.  PHYSICAL EXAMINATION:  VITAL SIGNS:  Temperature is 99.1, blood pressure 197.80, pulse 64, respirations 20.  GENERAL:  He was lying in bed in no acute distress.  HEENT:  Normocephalic and atraumatic.  Pupils are equal, round and reactive to light.  Extraocular movements are intact.  NECK:  Supple without lymphadenopathy.  CHEST:  Clear to auscultation bilaterally.  HEART:  Regular with no murmurs.  ABDOMEN:  Soft and nontender with positive bowel sounds.  EXTREMITIES:  Show trace ankle edema.  NEUROLOGICAL:  No focal signs and he is alert, oriented, and pleasant.  PERIPHERAL VASCULAR:  There are 2+ and regular pulses in the radial and posterior tibial arteries.  SKIN:  No rashes.  LABORATORY DATA:  White  blood count is 9, hemoglobin 10.5, platelets 346,000. Sodium is 140, potassium 5.1, chloride 108, bicarbonate 24, BUN 59, and creatinine 2.1.  Glucose 108, calcium 9.0.  AST is 20, ALT 16, alkaline phosphatase 73.  Total bilirubin 0.6, total protein 6.6, albumin 3.8, phosphorus 4.7, CK is 169.  Urine creatinine is 59.  Urine sodium is pending. Urinalysis is entirely negative.  Renal ultrasound shows 11 cm kidneys with no hydronephrosis or obstruction.  ASSESSMENT/PLAN: 1. Acute renal failure:  This appears to be improving as his creatinine has    dropped to 2.1.   However, the etiology remains unclear.  He appears    uvolemic.  He has no obstruction on ultrasound and his urinalysis is    completely normal.  We will observe him and his urine output and creatinine    for a day or two to see if we can figure out what is happening.  We will    check a chest x-ray, urine, sodium, and repeat his BNP in the morning. 2. Hypertension:  We will resume his home medication and observe. 3. Other issues are stable:  This patient was seen and discussed with Dr.    Britt Bottom C. Powell. Dictated by:   Dante Gang, M.D. Attending Physician:  Hans Eden DD:  04/24/02 TD:  04/27/02 Job: [id number]

## 2011-02-17 NOTE — Discharge Summary (Signed)
NAME:  TOR, TSUDA NO.:  [id number]   MEDICAL RECORD NO.:  [id number]          PATIENT TYPE:  INP   LOCATION:  3711                         FACILITY:  [facility]   PHYSICIAN:  [name]. Riley Kill, M.D. Romualdo Bolk OF BIRTH:  Sep 15, 1929   DATE OF ADMISSION:  07/30/2005  DATE OF DISCHARGE:  08/02/2005                                 DISCHARGE SUMMARY   PRIMARY CARE PHYSICIAN:  Delaney Meigs, M.D.   PRINCIPAL DIAGNOSIS:  1.  Chest pain.  Cardiac enzymes negative for myocardial infarction and the      patient currently refusing cardiac catheterization.  2.  Abnormal chest x-ray with prominence of the mediastinum and cardiac      silhouette and increased markings along the lung base.  CT scan      scheduled for followup.  3.  Mild chronic renal insufficiency.  BUN 21, creatinine 1 this admission.  4.  Diabetes.  5.  Ongoing tobacco use.  6.  Peripheral vascular disease with 60-80% left carotid stenosis.  7.  Gastroesophageal reflux disease.  8.  Status post aortocoronary bypass surgery in 1993 with left internal      mammary artery to left anterior descending and first diagonal, saphenous      vein graft to distal right coronary artery.  9.  Status post Taxus stent to the distal right coronary artery via the      saphenous vein graft July 2005.  10. Residual coronary artery disease with the left anterior descending      totaled, 70% mid right coronary artery and 70-80% obtuse marginal #3 at      catheterization in July 2005 with patent grafts and medical therapy for      other disease.  11. Hypertension.  12. Hyperlipidemia.  13. Back pain.  14. History of enlarged hilar nodes by chest CT scan, repeat chest CT scan      pending.   ALLERGIES:  No known drug allergies.   HOSPITAL COURSE:  Mr. Quast is a 77 year old male with known coronary artery  disease.  He began having sharp chest pain that was episodic and also was  complaining of a pressure-type chest pain  that was worse with exertion.  He  had an associated shortness of breath.  His symptoms improved with IV  nitroglycerin.  He was admitted for further evaluation and treatment.   His cardiac enzymes were negative for MI and he had no further chest pain on  IV nitroglycerin and heparin.  Carotid Dopplers were performed which showed  no significant ICA stenosis on the right and 40-60% ICA stenosis on the  left.  Dr. Riley Kill evaluated Mr. Tavano and recommended catheterization but  the patient is currently wishing to hold off and use medical therapy.  An  abdominal ultrasound was ordered which was negative for an abdominal aortic  aneurysm and also showed no hydronephrosis.   Mr. Pavon was counseled on smoking cessation and hopes to quit.  A lipid  profile was performed which showed total cholesterol of 192, triglycerides  of 103, HDL 43, LDL 128.  He is possibly  having myalgias secondary to the  Zocor although he is continuing to take it at 60 mg a day so no dose change  was made at this time.  He is to further discuss this as an outpatient.   Dr. Riley Kill also recommended that he not drive until being seen in the  office.   Mr. Palmeri initially had a potassium level of 3.7.  A repeat BMET was  performed and showed a potassium of 5.9.  Another test done the same day  showed a potassium level of 4.7.  He has not received supplementation.  It  was felt that the 5.9 was an anomaly and needed no further evaluation.   By August 02, 2005 Mr. Huynh was ambulating without chest pain or shortness  of breath.  He was evaluated by Dr. Riley Kill and considered stable for  discharge with outpatient followup arranged.   DISCHARGE INSTRUCTIONS:  1.  His activity level is to be increased slowly and no driving until seen      by M.D.  2.  He is to stick to a low-fat diabetic diet.  3.  He is to get a CT scan of the chest on November 3 at 2:30 and follow up      with Dr. Rosalyn Charters P.A.-C. on November 10 at  1:45.  He is to follow up      with Dr. Lysbeth Galas as needed.   DISCHARGE MEDICATIONS:  1.  Trigosamine as at home.  2.  Coated aspirin 325 mg daily.  3.  Spectrovite daily.  4.  Felodipine 2.5 mg three times a day.  5.  Plavix 75 mg daily.  6.  Omeprazole 20 mg daily.  7.  Hydrochlorothiazide 25 mg daily.  8.  Avandia 8 mg daily.  9.  Metoprolol 50 mg twice a day.  10. Simvastatin 40 mg 1-1/2 tabs daily.  11. Nitroglycerin subcutaneous layer p.r.n.   Time spent at discharge:  34 minutes.      Theodore Demark, P.A. [facility]      Thomas D. Riley Kill, M.D. [facility]  Electronically Signed    RB/MEDQ  D:  08/02/2005  T:  08/03/2005  Job:  [id number]   cc:   Delaney Meigs, M.D.  Fax: [phone]

## 2011-02-17 NOTE — H&P (Signed)
NAME:  Brendan Arroyo, SPEASE NO.:  [id number]   MEDICAL RECORD NO.:  [id number]                   PATIENT TYPE:  INP   LOCATION:                                       FACILITY:  [facility]   PHYSICIAN:  Duke Salvia, M.D.               DATE OF BIRTH:  11/11/1928   DATE OF ADMISSION:  04/11/2004  DATE OF DISCHARGE:                                HISTORY & PHYSICAL   REASON FOR ADMISSION:  The patient is a 77 year old male with known coronary  artery disease status post CABG in 1993 followed by Dr. Shawnie Pons who  presents to the office today with symptoms suggestive of unstable angina  pectoris.   The patient has done well since undergoing three-vessel CABG in 1993 with  grafting of LIMA-LAD-first diagonal and SVG-RCA.  He has history of normal  left ventricular function and most recent stress test was a normal exercise  Cardiolite in January 2000.   The patient had been doing well since his last office visit with Dr. Riley Kill  in February 2004.  However, approximately 1 week ago he noted development of  chest pain both with and without activity.  These symptoms have progressed  since then.  He awoke at 3 a.m. this morning to go to the bathroom and  subsequently developed severe anterior chest discomfort, initially sharp,  which radiated to the left arm.  There was no associated dyspnea,  diaphoresis, or nausea.  This episode was worse than those in the recent  past.  He took one nitroglycerin with prompt relief.  He then had recurrent  angina after walking back with his newspaper.  Symptoms were similar in  location but less intense.  They resolved with rest.   The patient has not had any recurrent chest discomfort since early this  morning.  Electrocardiogram shows no acute changes.   ALLERGIES:  No known drug allergies.   MEDICATIONS:  1. Omeprazole 20 daily.  2. Metoprolol 50 b.i.d.  3. Simvastatin 40 q.h.s.  4. Rosiglitazone 8 daily.  5.  Hydrochlorothiazide 25 daily.  6. Aspirin 325 daily.  7. Felodipine 2.5 daily.   PAST MEDICAL HISTORY:  1. Coronary artery disease as noted above.  2. Hypertension.  3. Type 2 diabetes mellitus.  4. Hyperlipidemia.  5. Cerebrovascular disease (60-80% left ICA 2003).  6. Status post lumbar laminectomy.  7. Gastroesophageal reflux disease.   SOCIAL HISTORY:  Married, one daughter.  Lives in [place].  Has remote  history of tobacco smoking with subsequent cessation and resumption earlier  this year.  However, he reports today that he has not smoked for the past  month or so.  Drinks alcohol on occasion with no known history of abuse.   FAMILY HISTORY:  Denies any known history of premature coronary artery  disease.   REVIEW OF SYSTEMS:  No recent fever, hemoptysis, hematuria, melena,  orthopnea, PND, or  pedal edema.  Otherwise, as per HPI.  Remaining systems  negative.   PHYSICAL EXAMINATION:  VITAL SIGNS:  Blood pressure 147/66, pulse 64 and  regular, weight 208.  GENERAL:  A 77 year old male, no apparent distress.  HEENT:  Normocephalic, atraumatic.  NECK:  Visible bilateral carotid pulses without bruits, no JVD.  LUNGS:  Clear to auscultation all fields.  HEART:  Regular rate and rhythm (S1, S2).  No murmurs, rubs, or gallops.  ABDOMEN:  Soft, nontender.  Intact bowel sounds without bruits.  EXTREMITIES:  Preserved bilateral femoral pulses with soft bilateral bruits;  intact peripheral pulses with no significant pedal edema.  NEUROLOGIC:  No focal deficit.   Electrocardiogram:  Sinus bradycardia at 55 beats per minute with normal  axis and no ischemic changes.   IMPRESSION:  1. Unstable angina pectoris.  2. Coronary artery disease.     a. Status post three-vessel coronary artery bypass grafting in 1993.     b. Normal exercise Cardiolite in 2000.     c. Normal left ventricular function.  3. Dyslipidemia.  4. Type 2 diabetes mellitus.  5. Tobacco.  6. Hypertension.   7. Gastroesophageal reflux disease.   PLAN:  Admit directly to [facility] for treatment of unstable  angina pectoris.  The patient will continue on aspirin, beta blocker, and  started on IV nitroglycerin and heparin.  If serial cardiac markers are  abnormal,  recommendation is to add Integrilin.  The patient will proceed with cardiac  catheterization tomorrow.  Risks/benefits of the procedure have been  discussed and the patient is willing to proceed.  The patient was seen and  examined in conjunction with Dr. Sherryl Manges.      Gene Serpe, P.A. [facility]                      Duke Salvia, M.D.    GS/MEDQ  D:  04/11/2004  T:  04/11/2004  Job:  [id number]   cc:   Delaney Meigs, M.D.  [address]  [place]  [place] [address]  Fax: [phone]

## 2011-03-09 ENCOUNTER — Encounter: Payer: Self-pay | Admitting: Cardiology

## 2011-04-20 ENCOUNTER — Ambulatory Visit (INDEPENDENT_AMBULATORY_CARE_PROVIDER_SITE_OTHER): Payer: Medicare Other | Admitting: Cardiology

## 2011-04-20 ENCOUNTER — Encounter: Payer: Self-pay | Admitting: Cardiology

## 2011-04-20 DIAGNOSIS — I2510 Atherosclerotic heart disease of native coronary artery without angina pectoris: Secondary | ICD-10-CM

## 2011-04-20 DIAGNOSIS — I6529 Occlusion and stenosis of unspecified carotid artery: Secondary | ICD-10-CM

## 2011-04-20 DIAGNOSIS — I25810 Atherosclerosis of coronary artery bypass graft(s) without angina pectoris: Secondary | ICD-10-CM

## 2011-04-20 DIAGNOSIS — E785 Hyperlipidemia, unspecified: Secondary | ICD-10-CM

## 2011-04-20 NOTE — Patient Instructions (Signed)
Your physician recommends that you schedule a follow-up appointment in: 6 months  

## 2011-04-24 NOTE — Assessment & Plan Note (Signed)
Seems symptomatically controlled at the present time.  Continue medical therapy.

## 2011-04-24 NOTE — Progress Notes (Signed)
HPI:  He is doing pretty well.  Now is in [place].  No chest pain, nor shortness of breath, and no acid reflux at present.  Has some labs done, and had elevated uric acid.  He is taking prilosec sparingly, does not think he needs, and he and I discussed the 2C19 issue with the two drugs in detail.  I explained the interaction to him.  Current Outpatient Prescriptions  Medication Sig Dispense Refill  . amLODipine (NORVASC) 5 MG tablet Take 5 mg by mouth 2 (two) times daily. Take 1 tablet am, and 1/2 tablet pm.       . aspirin 81 MG EC tablet Take 81 mg by mouth daily.        . clopidogrel (PLAVIX) 75 MG tablet Take 75 mg by mouth daily.        Marland Kitchen ezetimibe (ZETIA) 10 MG tablet Take 10 mg by mouth daily.        . hydrochlorothiazide 25 MG tablet Take 25 mg by mouth daily.        . meloxicam (MOBIC) 7.5 MG tablet Take 7.5 mg by mouth daily.        . metoprolol (LOPRESSOR) 50 MG tablet Take 50 mg by mouth 2 (two) times daily.        . Multiple Vitamins-Minerals ([employer] SPECTRAVITE SENIOR) TABS Take 1 tablet by mouth daily.        . nitroGLYCERIN (NITROSTAT) 0.4 MG SL tablet Place 0.4 mg under the tongue every 5 (five) minutes as needed.        Marland Kitchen omeprazole (PRILOSEC) 20 MG capsule Take 20 mg by mouth daily.        . rosuvastatin (CRESTOR) 10 MG tablet Take 5 mg by mouth daily.          No Known Allergies  Past Medical History  Diagnosis Date  . Renal insufficiency   . Dyslipidemia   . HTN (hypertension)   . DM (diabetes mellitus)   . Occlusion and stenosis of carotid artery without mention of cerebral infarction   . Coronary atherosclerosis of artery bypass graft   . H/O: gout     Past Surgical History  Procedure Date  . Cabg 1993  . Coronary stent placement July 2005    taxus stent of the distal right coronary artery     No family history on file.  History   Social History  . Marital Status: Married    Spouse Name: N/A    Number of Children: N/A  . Years of Education: N/A    Occupational History  . Not on file.   Social History Main Topics  . Smoking status: Current Everyday Smoker  . Smokeless tobacco: Not on file   Comment: quit 1 year ago (2011?). has a 40 pack-year history   . Alcohol Use: Yes     social  . Drug Use: No  . Sexually Active: Not on file   Other Topics Concern  . Not on file   Social History Narrative   Lives in [place] and is retired. Tries to eat a heart health diet and exercises regularly. Only interval medication is multivitamin.     ROS: Please see the HPI.  All other systems reviewed and negative.  PHYSICAL EXAM:  BP 149/59  Pulse 58  Resp 18  Ht 5\' 10"  (1.778 m)  Wt 186 lb (84.369 kg)  BMI 26.69 kg/m2  General: Well developed, well nourished, in no acute distress. Head:  Normocephalic and atraumatic.  Neck: no JVD Lungs: Clear to auscultation and percussion. Heart: Normal S1 and S2.  No murmur, rubs or gallops.  Abdomen:  Normal bowel sounds; soft; non tender; no organomegaly Pulses: Pulses normal in all 4 extremities. Extremities: No clubbing or cyanosis. No edema. Neurologic: Alert and oriented x 3.  EKG:  SB, otherwise normal.  ASSESSMENT AND PLAN:

## 2011-04-24 NOTE — Assessment & Plan Note (Signed)
Bilateral 40-59% with repeat studies suggested in November 2011

## 2011-04-24 NOTE — Assessment & Plan Note (Signed)
Labs are being done at the [place] and are controlled.  Need data from the [place].

## 2011-05-08 ENCOUNTER — Encounter: Payer: Self-pay | Admitting: Cardiology

## 2011-07-11 LAB — CBC
HCT: 32.7 — ABNORMAL LOW
HCT: 35.5 — ABNORMAL LOW
HCT: 38.1 — ABNORMAL LOW
HCT: 42.7
Hemoglobin: 11.2 — ABNORMAL LOW
Hemoglobin: 11.9 — ABNORMAL LOW
Hemoglobin: 12.8 — ABNORMAL LOW
Hemoglobin: 14.4
MCHC: 33.5
MCV: 90.7
RBC: 3.62 — ABNORMAL LOW
RBC: 3.92 — ABNORMAL LOW
RBC: 4.22
RDW: 13.3
RDW: 13.4
WBC: 9.7
WBC: 9.7

## 2011-07-11 LAB — CARDIAC PANEL(CRET KIN+CKTOT+MB+TROPI)
CK, MB: 2.4
Total CK: 74
Total CK: 84

## 2011-07-11 LAB — COMPREHENSIVE METABOLIC PANEL
ALT: 17
AST: 22
BUN: 15
CO2: 26
CO2: 29
Calcium: 8.6
Chloride: 105
GFR calc Af Amer: >60
GFR calc non Af Amer: >60
GFR calc non Af Amer: >60
Glucose, Bld: 100 — ABNORMAL HIGH
Sodium: 141
Total Bilirubin: 0.8
Total Protein: 5.2 — ABNORMAL LOW

## 2011-07-11 LAB — BASIC METABOLIC PANEL
GFR calc Af Amer: >60
GFR calc non Af Amer: >60
Glucose, Bld: 133 — ABNORMAL HIGH
Potassium: 5.1
Sodium: 138

## 2011-07-11 LAB — I-STAT 8, (EC8 V) (CONVERTED LAB)
BUN: 25 — ABNORMAL HIGH
Bicarbonate: 27.4 — ABNORMAL HIGH
Glucose, Bld: 235 — ABNORMAL HIGH
HCT: 47
Operator id: 196461
pCO2, Ven: 48.7

## 2011-07-11 LAB — MAGNESIUM: Magnesium: 1.8

## 2011-07-11 LAB — LIPID PANEL
Cholesterol: 187
LDL Cholesterol: 132 — ABNORMAL HIGH
VLDL: 17

## 2011-07-11 LAB — POCT CARDIAC MARKERS: Troponin i, poc: <0.05

## 2011-07-11 LAB — DIFFERENTIAL
Eosinophils Relative: 5
Lymphocytes Relative: 27
Lymphs Abs: 2.6
Monocytes Absolute: 1

## 2011-07-11 LAB — POCT I-STAT CREATININE: Creatinine, Ser: 1.2

## 2011-07-11 LAB — TROPONIN I: Troponin I: 0.02

## 2011-08-08 ENCOUNTER — Encounter: Payer: Medicare Other | Admitting: *Deleted

## 2011-08-23 ENCOUNTER — Encounter (INDEPENDENT_AMBULATORY_CARE_PROVIDER_SITE_OTHER): Payer: Medicare Other | Admitting: *Deleted

## 2011-08-23 DIAGNOSIS — I6529 Occlusion and stenosis of unspecified carotid artery: Secondary | ICD-10-CM

## 2011-08-30 ENCOUNTER — Ambulatory Visit (HOSPITAL_COMMUNITY): Payer: Medicare Other | Admitting: Psychology

## 2011-10-23 ENCOUNTER — Encounter: Payer: Self-pay | Admitting: Gastroenterology

## 2011-10-24 ENCOUNTER — Ambulatory Visit (INDEPENDENT_AMBULATORY_CARE_PROVIDER_SITE_OTHER): Payer: Medicare Other | Admitting: Gastroenterology

## 2011-10-24 ENCOUNTER — Ambulatory Visit: Payer: Medicare Other | Admitting: Gastroenterology

## 2011-10-24 ENCOUNTER — Encounter: Payer: Self-pay | Admitting: Gastroenterology

## 2011-10-24 DIAGNOSIS — Z1211 Encounter for screening for malignant neoplasm of colon: Secondary | ICD-10-CM

## 2011-10-24 DIAGNOSIS — R1310 Dysphagia, unspecified: Secondary | ICD-10-CM | POA: Insufficient documentation

## 2011-10-24 NOTE — Patient Instructions (Signed)
Upper GI Endoscopy Upper GI endoscopy means using a flexible scope to look at the esophagus, stomach, and upper small bowel. This is done to make a diagnosis in people with heartburn, abdominal pain, or abnormal bleeding. Sometimes an endoscope is needed to remove foreign bodies or food that become stuck in the esophagus; it can also be used to take biopsy samples. For the best results, do not eat or drink for 8 hours before having your upper endoscopy.  To perform the endoscopy, you will probably be sedated and your throat will be numbed with a special spray. The endoscope is then slowly passed down your throat (this will not interfere with your breathing). An endoscopy exam takes 15 to 30 minutes to complete and there is no real pain. Patients rarely remember much about the procedure. The results of the test may take several days if a biopsy or other test is taken.  You may have a sore throat after an endoscopy exam. Serious complications are very rare. Stick to liquids and soft foods until your pain is better. Do not drive a car or operate any dangerous equipment for at least 24 hours after being sedated. SEEK IMMEDIATE MEDICAL CARE IF:   You have severe throat pain.   You have shortness of breath.   You have bleeding problems.   You have a fever.   You have difficulty recovering from your sedation.  Document Released: 10/26/2004 Document Revised: 05/31/2011 Document Reviewed: 09/20/2008 [employer] Patient Information 2012 [employer]. 

## 2011-10-24 NOTE — Assessment & Plan Note (Signed)
Plan screening colonoscopy at a later date

## 2011-10-24 NOTE — Assessment & Plan Note (Signed)
I suspect that he has a fixed stricture, but peptic or malignant  Recommendations #1 upper endoscopy with dilatation as indicated.  I will check with his cardiologist to determine whether Plavix can be held.

## 2011-10-24 NOTE — Progress Notes (Signed)
History of Present Illness: [name] is a pleasant 78 year old white male with history of coronary artery disease, hypertension, diabetes and renal insufficiency, on Plavix, referred at the request of Dr. Lysbeth Galas for evaluation of dysphagia. Approximately a week ago he developed acute onset of dysphagia consisting of choking with the inability to pass food. This lasted for about an hour and a half. Since that time he's had dysphagia to solids. He denies odynophagia. He has a history of occasional pyrosis for which he is on omeprazole.  The patient has a remote history of diverticulitis 20-30 years ago and thinks he may have had a polyp.    Past Medical History  Diagnosis Date  . Renal insufficiency   . Dyslipidemia   . HTN (hypertension)   . DM (diabetes mellitus)   . Occlusion and stenosis of carotid artery without mention of cerebral infarction   . Coronary atherosclerosis of artery bypass graft   . H/O: gout   . Diverticulitis   . Colon polyps    Past Surgical History  Procedure Date  . Cabg 1993  . Coronary stent placement July 2005    taxus stent of the distal right coronary artery   . Elbow surgery     right  . Tonsillectomy   . Back surgery    family history includes Diabetes in his brother and mother; Heart disease in his maternal grandfather; and Ovarian cancer in his mother. Current Outpatient Prescriptions  Medication Sig Dispense Refill  . amLODipine (NORVASC) 5 MG tablet Take 5 mg by mouth 2 (two) times daily. Take 1 tablet am, and 1/2 tablet pm.       . aspirin 81 MG EC tablet Take 81 mg by mouth daily.        . clopidogrel (PLAVIX) 75 MG tablet Take 75 mg by mouth daily.        Marland Kitchen ezetimibe (ZETIA) 10 MG tablet Take 10 mg by mouth daily.        . hydrochlorothiazide 25 MG tablet Take 25 mg by mouth daily.        . meloxicam (MOBIC) 7.5 MG tablet Take 7.5 mg by mouth daily.        . metoprolol (LOPRESSOR) 50 MG tablet Take 50 mg by mouth 2 (two) times daily.          . Multiple Vitamins-Minerals (CVS SPECTRAVITE SENIOR) TABS Take 1 tablet by mouth daily.        . nitroGLYCERIN (NITROSTAT) 0.4 MG SL tablet Place 0.4 mg under the tongue every 5 (five) minutes as needed.        Marland Kitchen omeprazole (PRILOSEC) 20 MG capsule Take 20 mg by mouth daily.        . rosuvastatin (CRESTOR) 10 MG tablet Take 5 mg by mouth daily.         Allergies as of 10/24/2011  . (No Known Allergies)    reports that he has quit smoking. He does not have any smokeless tobacco history on file. He reports that he drinks alcohol. He reports that he does not use illicit drugs.     Review of Systems: Pertinent positive and negative review of systems were noted in the above HPI section. All other review of systems were otherwise negative.  Vital signs were reviewed in today's medical record Physical Exam: General: Well developed , well nourished appearing younger than his stated age  Head: Normocephalic and atraumatic Eyes:  sclerae anicteric, EOMI Ears: Normal auditory acuity Mouth: No deformity or  lesions Neck: Supple, no masses or thyromegaly Lungs: Clear throughout to auscultation Heart: Regular rate and rhythm; no murmurs, rubs or bruits Abdomen: Soft, non tender and non distended. No masses, hepatosplenomegaly or hernias noted. Normal Bowel sounds Rectal:deferred Musculoskeletal: Symmetrical with no gross deformities  Skin: No lesions on visible extremities Pulses:  Normal pulses noted Extremities: No clubbing, cyanosis, edema or deformities noted Neurological: Alert oriented x 4, grossly nonfocal Cervical Nodes:  No significant cervical adenopathy Inguinal Nodes: No significant inguinal adenopathy Psychological:  Alert and cooperative. Normal mood and affect

## 2011-10-30 ENCOUNTER — Telehealth: Payer: Self-pay | Admitting: Cardiology

## 2011-10-30 NOTE — Telephone Encounter (Signed)
Per Dr Riley Kill this patient can hold Plavix 5 days prior to Endoscopic Procedure.  The pt must remain on Aspirin throughout procedure. I spoke with Zella Ball and made her aware of Dr Rosalyn Charters recommendation.

## 2011-10-30 NOTE — Telephone Encounter (Signed)
New Problem    Robin  Ext [phone] from [facility], sent fax 10/24/11 regarding patient meds and procedure no response.  Please return call to her ASAP

## 2011-10-31 ENCOUNTER — Telehealth: Payer: Self-pay | Admitting: *Deleted

## 2011-10-31 NOTE — Telephone Encounter (Signed)
OK to hold Plavix per Dr Tedra Senegal  Will send approval down to be scanned in  Pt aware

## 2011-11-06 ENCOUNTER — Encounter: Payer: Self-pay | Admitting: Gastroenterology

## 2011-11-06 ENCOUNTER — Ambulatory Visit (AMBULATORY_SURGERY_CENTER): Payer: Medicare Other | Admitting: Gastroenterology

## 2011-11-06 VITALS — BP 180/83 | HR 61 | Temp 97.1°F | Resp 16 | Ht 70.0 in | Wt 196.0 lb

## 2011-11-06 DIAGNOSIS — K222 Esophageal obstruction: Secondary | ICD-10-CM

## 2011-11-06 DIAGNOSIS — R1310 Dysphagia, unspecified: Secondary | ICD-10-CM

## 2011-11-06 DIAGNOSIS — K209 Esophagitis, unspecified: Secondary | ICD-10-CM

## 2011-11-06 DIAGNOSIS — K219 Gastro-esophageal reflux disease without esophagitis: Secondary | ICD-10-CM

## 2011-11-06 HISTORY — DX: Gastro-esophageal reflux disease without esophagitis: K21.9

## 2011-11-06 HISTORY — DX: Esophageal obstruction: K22.2

## 2011-11-06 LAB — GLUCOSE, CAPILLARY: Glucose-Capillary: 117 mg/dL — ABNORMAL HIGH (ref 70–99)

## 2011-11-06 MED ORDER — SODIUM CHLORIDE 0.9 % IV SOLN: 500.0000 mL | INTRAVENOUS | Status: DC

## 2011-11-06 NOTE — Op Note (Signed)
[facility] [address] [place], [place]  [address]  ENDOSCOPY PROCEDURE REPORT  PATIENT:  Brendan Arroyo, Brendan Arroyo  MR#:  [id number] BIRTHDATE:  1929/09/16, 82 yrs. old  GENDER:  male  ENDOSCOPIST:  Barbette Hair. Arlyce Dice, MD Referred by:  Joette Catching, M.D.  PROCEDURE DATE:  11/06/2011 PROCEDURE:  EGD with biopsy, 43239, Maloney Dilation of Esophagus ASA CLASS:  Class II INDICATIONS:  dysphagia  MEDICATIONS:   MAC sedation, administered by CRNA propofol 170mg IV, glycopyrrolate (Robinal) 0.2 mg IV, 0.6cc simethancone 0.6 cc PO TOPICAL ANESTHETIC:  DESCRIPTION OF PROCEDURE:   After the risks and benefits of the procedure were explained, informed consent was obtained.  The LB [id number]-H180 [id number] endoscope was introduced through the mouth and advanced to the third portion of the duodenum.  The instrument was slowly withdrawn as the mucosa was fully examined. <<PROCEDUREIMAGES>>  A stricture was found at the gastroesophageal junction (see image8). Moderate stricture Dilation with maloney dilator 18mm Mild resistance; minimal heme  Esophagitis was found. Desquamative esophagitis. Bxs taken (see image7 and image5).  Duodenitis was found in the bulb and descending duodenum. Few areas of erythema Otherwise the examination was normal (see image1 and image2). Retroflexed views revealed no abnormalities.    The scope was then withdrawn from the patient and the procedure completed. COMPLICATIONS:  None  ENDOSCOPIC IMPRESSION: 1) Stricture at the gastroesophageal junction - s/p maloney dilitation 2) Esophagitis 3) Duodenitis in the bulb/descending duodenum 4) Otherwise normal examination RECOMMENDATIONS: 1) Call office next 2-3 days to schedule an office appointment for 4-6 weeks 2) increase prilosec to 40mg  (2 tabs) daily 3) resume plavix in 3 days  ______________________________ Barbette Hair. Arlyce Dice, MD  CC:  n. eSIGNED:   Barbette Hair. Samir Ishaq at 11/06/2011 12:06 PM  Azzie Roup, [id number]

## 2011-11-06 NOTE — Progress Notes (Signed)
Patient did not experience any of the following events: a burn prior to discharge; a fall within the facility; wrong site/side/patient/procedure/implant event; or a hospital transfer or hospital admission upon discharge from the facility. (G8907) Patient did not have preoperative order for IV antibiotic SSI prophylaxis. (G8918)  

## 2011-11-06 NOTE — Progress Notes (Signed)
Per the pt his last dose of plavix was 10/31/2011. maw

## 2011-11-06 NOTE — Patient Instructions (Addendum)
Resume plavix in 3 days. Increase prilosec to 2 tabs qam.  PLEASE FOLLOW THE ESOPHAGEAL DILATION DIET AS FOLLOWS:  -NOTHING BY MOUTH UNTIL 1:00PM  -CLEAR LIQUIDS FOR 1 HOUR 1:00PM-2:00PM  -SOFT FOODS FOR THE REST OF TODAY 2:00PM UNTIL TOMORROW  Esophageal Stricture The esophagus is the long, narrow tube which carries food and liquid from the mouth to the stomach. Sometimes a part of the esophagus becomes narrow and makes it difficult, painful, or even impossible to swallow. This is called an esophageal stricture.  CAUSES  Common causes of blockage or strictures of the esophagus are:  Exposure of the lower esophagus to the acid from the stomach may cause narrowing.   Hiatal hernia in which a small part of the stomach bulges up through the diaphragm can cause a narrowing in the bottom of the esophagus.   Scleroderma is a tissue disorder that affects the esophagus and makes swallowing difficult.   Achalasia is an absence of nerves in the lower esophagus and to the esophageal sphincter. This absence of nerves may be congenital (present since birth). This can cause irregular spasms which do not allow food and fluid through.   Strictures may develop from swallowing materials which damage the esophagus. Examples are acids or alkalis such as lye.   Schatzki's Ring is a narrow ring of non-cancerous tissue which narrows the lower esophagus. The cause of this is unknown.   Growths can block the esophagus.  SYMPTOMS  Some of the problems are difficulty swallowing or pain with swallowing. DIAGNOSIS  Your caregiver often suspects this problem by taking a medical history. They will also do a physical exam. They may then take X-rays and/or perform an endoscopy. Endoscopy is an exam in which a tube like a small flexible telescope is used to look at your esophagus.  TREATMENT  One form of treatment is to dilate the narrow area. This means to stretch it.   When this is not successful, chest surgery  may be required. This is a much more extensive form of treatment with a longer recovery time.  Both of the above treatments make the passage of food and water into the stomach easier. They also make it easier for stomach contents to bubble back into the esophagus. Special medications may be used following the procedure to help prevent further narrowing. Medications may be used to lower the amount of acid in the stomach juice.  SEEK IMMEDIATE MEDICAL CARE IF:   Your swallowing is becoming more painful, difficult, or you are unable to swallow.   You vomit up blood.   You develop black tarry stools.   You develop chills.   You have a fever.   You develop chest or abdominal pain.   You develop shortness of breath, feel lightheaded, or faint.  Follow up with medical care as your caregiver suggests. Document Released: 05/29/2006 Document Revised: 05/31/2011 Document Reviewed: 07/05/2006 [facility] Patient Information 2012 [place], [place].

## 2011-11-07 ENCOUNTER — Telehealth: Payer: Self-pay

## 2011-11-07 NOTE — Telephone Encounter (Signed)
  Follow up Call-  Call back number 11/06/2011  Post procedure Call Back phone  # [phone] cell  Permission to leave phone message Yes     Patient questions:  Do you have a fever, pain , or abdominal swelling? no Pain Score  0 *  Have you tolerated food without any problems? yes  Have you been able to return to your normal activities? yes  Do you have any questions about your discharge instructions: Diet   no Medications  no Follow up visit  no  Do you have questions or concerns about your Care? no  Actions: * If pain score is 4 or above: No action needed, pain <4. No problems noted. maw

## 2011-11-10 ENCOUNTER — Encounter: Payer: Self-pay | Admitting: Gastroenterology

## 2011-11-28 ENCOUNTER — Encounter: Payer: Self-pay | Admitting: *Deleted

## 2011-12-04 ENCOUNTER — Encounter: Payer: Self-pay | Admitting: Gastroenterology

## 2011-12-04 ENCOUNTER — Ambulatory Visit (INDEPENDENT_AMBULATORY_CARE_PROVIDER_SITE_OTHER): Payer: Medicare Other | Admitting: Gastroenterology

## 2011-12-04 DIAGNOSIS — K219 Gastro-esophageal reflux disease without esophagitis: Secondary | ICD-10-CM

## 2011-12-04 DIAGNOSIS — K222 Esophageal obstruction: Secondary | ICD-10-CM

## 2011-12-04 NOTE — Progress Notes (Signed)
History of Present Illness:  Brendan Arroyo has returned following dilatation of a distal esophageal stricture. Since that time he has felt well and has had no further episodes of dysphagia or food impaction. He is without pyrosis.    Review of Systems: Pertinent positive and negative review of systems were noted in the above HPI section. All other review of systems were otherwise negative.    Current Medications, Allergies, Past Medical History, Past Surgical History, Family History and Social History were reviewed in [employer] [facility] electronic medical record  Vital signs were reviewed in today's medical record. Physical Exam: General: Well developed , well nourished, no acute distress

## 2011-12-04 NOTE — Assessment & Plan Note (Signed)
He is asymptomatic on omeprazole.

## 2011-12-04 NOTE — Assessment & Plan Note (Signed)
Asymptomatic following esophageal dilatation. Plan repeat dilatation as needed

## 2011-12-04 NOTE — Patient Instructions (Signed)
Follow up as needed

## 2011-12-06 ENCOUNTER — Ambulatory Visit: Payer: Medicare Other | Admitting: Cardiology

## 2012-01-04 ENCOUNTER — Ambulatory Visit: Payer: Medicare Other | Admitting: Cardiology

## 2012-01-10 ENCOUNTER — Ambulatory Visit: Payer: Medicare Other | Admitting: Cardiology

## 2012-01-11 ENCOUNTER — Ambulatory Visit (INDEPENDENT_AMBULATORY_CARE_PROVIDER_SITE_OTHER): Payer: Medicare Other | Admitting: Cardiology

## 2012-01-11 ENCOUNTER — Encounter: Payer: Self-pay | Admitting: Cardiology

## 2012-01-11 VITALS — BP 140/60 | HR 65 | Ht 70.0 in | Wt 193.0 lb

## 2012-01-11 DIAGNOSIS — E785 Hyperlipidemia, unspecified: Secondary | ICD-10-CM

## 2012-01-11 DIAGNOSIS — I6529 Occlusion and stenosis of unspecified carotid artery: Secondary | ICD-10-CM

## 2012-01-11 DIAGNOSIS — I25810 Atherosclerosis of coronary artery bypass graft(s) without angina pectoris: Secondary | ICD-10-CM

## 2012-01-11 DIAGNOSIS — I10 Essential (primary) hypertension: Secondary | ICD-10-CM

## 2012-01-11 NOTE — Assessment & Plan Note (Signed)
See overview.  Continues to remain stable.  Do not change at present.

## 2012-01-11 NOTE — Assessment & Plan Note (Signed)
Followed by Dr. Lysbeth Galas.  Is on statin and zetia.

## 2012-01-11 NOTE — Assessment & Plan Note (Signed)
Labs followed in Dr. Deitra Mayo office.

## 2012-01-11 NOTE — Progress Notes (Signed)
HPI:  He is doing well.  No chest pain.  Dances four nights per week.  No angina.  Wants to continue doing this.  Has slight numbness in the two ulnar fingers of the right hand.  Two prior operations to remove gouty deposits from the elbow.  He has not been smoking now for over six months.    Current Outpatient Prescriptions  Medication Sig Dispense Refill  . amLODipine (NORVASC) 5 MG tablet Take 5 mg by mouth 2 (two) times daily. Take 1 tablet am, and 1/2 tablet pm.       . aspirin 81 MG EC tablet Take 81 mg by mouth daily.        . budesonide-formoterol (SYMBICORT) 160-4.5 MCG/ACT inhaler Inhale 2 puffs into the lungs as needed.      . cholecalciferol (VITAMIN D) 1000 UNITS tablet Take 1,000 Units by mouth daily.      . clopidogrel (PLAVIX) 75 MG tablet Take 75 mg by mouth daily.        Marland Kitchen ezetimibe (ZETIA) 10 MG tablet Take 10 mg by mouth daily.        Marland Kitchen guaifenesin (HUMIBID E) 400 MG TABS Take 400 mg by mouth as needed.      . hydrochlorothiazide 25 MG tablet Take 25 mg by mouth daily.        . meloxicam (MOBIC) 7.5 MG tablet Take 7.5 mg by mouth daily.        . metoprolol (LOPRESSOR) 50 MG tablet Take 50 mg by mouth 2 (two) times daily.        . Multiple Vitamins-Minerals (CVS SPECTRAVITE SENIOR) TABS Take 1 tablet by mouth daily.        . nitroGLYCERIN (NITROSTAT) 0.4 MG SL tablet Place 0.4 mg under the tongue every 5 (five) minutes as needed.        Marland Kitchen omeprazole (PRILOSEC) 20 MG capsule Take 20 mg by mouth as needed.       . rosuvastatin (CRESTOR) 10 MG tablet Take 5 mg by mouth daily.          No Known Allergies  Past Medical History  Diagnosis Date  . Dyslipidemia   . HTN (hypertension)   . DM (diabetes mellitus)   . Occlusion and stenosis of carotid artery without mention of cerebral infarction   . Coronary atherosclerosis of artery bypass graft   . H/O: gout   . Diverticulitis   . Colon polyps   . Blood transfusion   . GERD (gastroesophageal reflux disease)   .  Hyperlipidemia   . Renal insufficiency   . Bronchitis   . Stricture and stenosis of esophagus 12/04/2011  . Esophageal reflux 12/04/2011    Past Surgical History  Procedure Date  . Cabg 1993  . Coronary stent placement July 2005    taxus stent of the distal right coronary artery   . Elbow surgery     right  . Tonsillectomy   . Back surgery     Family History  Problem Relation Age of Onset  . Ovarian cancer Mother   . Diabetes Mother   . Diabetes Brother   . Heart disease Maternal Grandfather   . Colon cancer Neg Hx   . Esophageal cancer Neg Hx   . Stomach cancer Neg Hx     History   Social History  . Marital Status: Married    Spouse Name: N/A    Number of Children: 1  . Years of Education: N/A  Occupational History  . retired    Social History Main Topics  . Smoking status: Former Games developer  . Smokeless tobacco: Never Used   Comment: quit 1 year ago (2011?). has a 40 pack-year history   . Alcohol Use: 2.4 oz/week    4 Shots of liquor per week     social  . Drug Use: No  . Sexually Active: Not on file   Other Topics Concern  . Not on file   Social History Narrative   Lives in [place] and is retired. Tries to eat a heart health diet and exercises regularly. Only interval medication is multivitamin.     ROS: Please see the HPI.  All other systems reviewed and negative.  PHYSICAL EXAM:  BP 140/60  Pulse 65  Ht 5\' 10"  (1.778 m)  Wt 193 lb (87.544 kg)  BMI 27.69 kg/m2  General: Well developed, well nourished, in no acute distress. Head:  Normocephalic and atraumatic. Neck: no JVD Lungs: Clear to auscultation and percussion. Heart: Normal S1 and S2.  No murmur, rubs or gallops.  Abdomen:  Normal bowel sounds; soft; non tender; no organomegaly Pulses: Pulses normal in all 4 extremities. Extremities: No clubbing or cyanosis. No edema.  Minor varicosities.  Gouty tophy in the R elbow.   Neurologic: Alert and oriented x 3.  EKG:  NSR.  WNL.  No acute  changes.    ASSESSMENT AND PLAN:

## 2012-01-11 NOTE — Patient Instructions (Signed)
Your physician wants you to follow-up in: 6 MONTHS.  You will receive a reminder letter in the mail two months in advance. If you don't receive a letter, please call our office to schedule the follow-up appointment.  Your physician recommends that you continue on your current medications as directed. Please refer to the Current Medication list given to you today.  

## 2012-01-11 NOTE — Assessment & Plan Note (Signed)
For repeat in one year.  Last study 08/2011

## 2012-01-16 ENCOUNTER — Telehealth: Payer: Self-pay | Admitting: Gastroenterology

## 2012-01-16 NOTE — Telephone Encounter (Signed)
According to hand written note in the chart Medicare would not cover Endo/flex  Per Dr. Jarold Motto that was scheduled in 12/1995

## 2012-07-08 ENCOUNTER — Ambulatory Visit (HOSPITAL_COMMUNITY)
Admission: RE | Admit: 2012-07-08 | Discharge: 2012-07-08 | Disposition: A | Discharge status: Home / Self Care | Payer: Medicare Other | Source: Ambulatory Visit | Attending: Cardiology | Admitting: Cardiology

## 2012-07-08 ENCOUNTER — Encounter: Payer: Self-pay | Admitting: Cardiology

## 2012-07-08 ENCOUNTER — Ambulatory Visit (INDEPENDENT_AMBULATORY_CARE_PROVIDER_SITE_OTHER): Payer: Medicare Other | Admitting: Cardiology

## 2012-07-08 VITALS — BP 136/54 | HR 50 | Resp 18 | Ht 70.0 in | Wt 194.0 lb

## 2012-07-08 DIAGNOSIS — I10 Essential (primary) hypertension: Secondary | ICD-10-CM

## 2012-07-08 DIAGNOSIS — I6529 Occlusion and stenosis of unspecified carotid artery: Secondary | ICD-10-CM

## 2012-07-08 DIAGNOSIS — I25810 Atherosclerosis of coronary artery bypass graft(s) without angina pectoris: Secondary | ICD-10-CM

## 2012-07-08 DIAGNOSIS — K219 Gastro-esophageal reflux disease without esophagitis: Secondary | ICD-10-CM

## 2012-07-08 DIAGNOSIS — I2510 Atherosclerotic heart disease of native coronary artery without angina pectoris: Secondary | ICD-10-CM | POA: Insufficient documentation

## 2012-07-08 DIAGNOSIS — E785 Hyperlipidemia, unspecified: Secondary | ICD-10-CM

## 2012-07-08 NOTE — Patient Instructions (Addendum)
Your physician recommends that you schedule a follow-up appointment in: MARCH 2014  Your physician has requested that you have a carotid duplex in November 2013. This test is an ultrasound of the carotid arteries in your neck. It looks at blood flow through these arteries that supply the brain with blood. Allow one hour for this exam. There are no restrictions or special instructions.  Your physician has recommended you make the following change in your medication: PLEASE Stop Prilosec and take Pepcid OTC  Office fax #[phone]  Your physician recommends that you have lab work: P2Y12 (Order given to pt--Admitting at [facility])

## 2012-07-08 NOTE — Assessment & Plan Note (Signed)
Will check P2Y12.  If responsive, will stop ASA, especially given meloxicam.

## 2012-07-08 NOTE — Assessment & Plan Note (Signed)
controlled 

## 2012-07-08 NOTE — Assessment & Plan Note (Signed)
Followed by Dr. Nyland. 

## 2012-07-08 NOTE — Assessment & Plan Note (Signed)
He uses prilosec only prn.  Suggested he use pepcid if he needs something on a prn basis.

## 2012-07-08 NOTE — Assessment & Plan Note (Signed)
Due for carotid

## 2012-07-08 NOTE — Progress Notes (Signed)
HPI:  The patient returns today in a followup visit. Generally, he is doing very well. He has gained about 10 pounds, has had to go back on diabetic medications.  He also is using meloxicam.  He does not use prilosec on a regular basis.  No chest pain.  Labs done at Dr. Madeira Copa office  Current Outpatient Prescriptions  Medication Sig Dispense Refill  . amLODipine (NORVASC) 5 MG tablet Take 5 mg by mouth 2 (two) times daily. Take 1 tablet am, and 1/2 tablet pm.       . aspirin 81 MG EC tablet Take 81 mg by mouth daily.        . budesonide-formoterol (SYMBICORT) 160-4.5 MCG/ACT inhaler Inhale 2 puffs into the lungs as needed.      . clopidogrel (PLAVIX) 75 MG tablet Take 75 mg by mouth daily.        Marland Kitchen ezetimibe (ZETIA) 10 MG tablet Take 10 mg by mouth daily.        Marland Kitchen guaifenesin (HUMIBID E) 400 MG TABS Take 400 mg by mouth as needed.      . hydrochlorothiazide 25 MG tablet Take 25 mg by mouth daily.        . meloxicam (MOBIC) 7.5 MG tablet Take 7.5 mg by mouth daily.        . metFORMIN (GLUCOPHAGE) 500 MG tablet Take 500 mg by mouth daily with breakfast.      . metoprolol (LOPRESSOR) 50 MG tablet Take 50 mg by mouth 2 (two) times daily.        . Multiple Vitamins-Minerals (CVS SPECTRAVITE SENIOR) TABS Take 1 tablet by mouth daily.        . nitroGLYCERIN (NITROSTAT) 0.4 MG SL tablet Place 0.4 mg under the tongue every 5 (five) minutes as needed.        Marland Kitchen omeprazole (PRILOSEC) 20 MG capsule Take 20 mg by mouth as needed.       . rosuvastatin (CRESTOR) 10 MG tablet Take 5 mg by mouth daily.        . cholecalciferol (VITAMIN D) 1000 UNITS tablet Take 1,000 Units by mouth daily.        No Known Allergies  Past Medical History  Diagnosis Date  . Dyslipidemia   . HTN (hypertension)   . DM (diabetes mellitus)   . Occlusion and stenosis of carotid artery without mention of cerebral infarction   . Coronary atherosclerosis of artery bypass graft   . H/O: gout   . Diverticulitis   . Colon  polyps   . Blood transfusion   . GERD (gastroesophageal reflux disease)   . Hyperlipidemia   . Renal insufficiency   . Bronchitis   . Stricture and stenosis of esophagus 12/04/2011  . Esophageal reflux 12/04/2011    Past Surgical History  Procedure Date  . Cabg 1993  . Coronary stent placement July 2005    taxus stent of the distal right coronary artery   . Elbow surgery     right  . Tonsillectomy   . Back surgery     Family History  Problem Relation Age of Onset  . Ovarian cancer Mother   . Diabetes Mother   . Diabetes Brother   . Heart disease Maternal Grandfather   . Colon cancer Neg Hx   . Esophageal cancer Neg Hx   . Stomach cancer Neg Hx     History   Social History  . Marital Status: Married    Spouse Name: N/A  Number of Children: 1  . Years of Education: N/A   Occupational History  . retired    Social History Main Topics  . Smoking status: Former Games developer  . Smokeless tobacco: Never Used   Comment: quit 1 year ago (2011?). has a 40 pack-year history   . Alcohol Use: 2.4 oz/week    4 Shots of liquor per week     social  . Drug Use: No  . Sexually Active: Not on file   Other Topics Concern  . Not on file   Social History Narrative   Lives in [place] and is retired. Tries to eat a heart health diet and exercises regularly. Only interval medication is multivitamin.     ROS: Please see the HPI.  All other systems reviewed and negative.  PHYSICAL EXAM:  BP 136/54  Pulse 50  Resp 18  Ht 5\' 10"  (1.778 m)  Wt 194 lb (87.998 kg)  BMI 27.84 kg/m2  SpO2 92%  General: Well developed, well nourished, in no acute distress. Head:  Normocephalic and atraumatic. Neck: no JVD Lungs: Clear to auscultation and percussion. Heart: Normal S1 and S2.  No murmur, rubs or gallops.  Abdomen:  Normal bowel sounds; soft; non tender; no organomegaly Pulses: Pulses normal in all 4 extremities. Extremities: No clubbing or cyanosis. No edema. Neurologic: Alert  and oriented x 3.  EKG:  SB.  Otherwise ok.   ASSESSMENT AND PLAN:

## 2012-07-10 ENCOUNTER — Telehealth: Payer: Self-pay | Admitting: Cardiology

## 2012-07-10 NOTE — Telephone Encounter (Signed)
I left a message on the pt's voicemail to make him aware that Dr Riley Kill has not reviewed his lab result at this time.

## 2012-07-10 NOTE — Telephone Encounter (Signed)
Pt would like blood work results °

## 2012-07-16 NOTE — Telephone Encounter (Signed)
The pt is scheduled to see Dr Lysbeth Galas tomorrow and has been very worried since he saw Dr Riley Kill.  The pt was under the impression that he had a mini stroke because Dr Riley Kill asked him this question at his appointment.  The pt has been very concerned and anxious since last week and did have some chest pain which he relates to his anxiety.  I made the pt aware that Dr Riley Kill only asked the pt this question because he is taking plavix and we are considering stopping this medication. The pt was relieved.  I made the pt aware that he should call the office if he has any further chest pain.  Pt agreed with plan.

## 2012-07-16 NOTE — Telephone Encounter (Signed)
F/u   Pt calling back to f/u on lab results, he can be reached at hm#.  Pt stresses he needs to talk with nurse ASAP as he has an appnt with his family doctor.

## 2012-07-17 NOTE — Telephone Encounter (Signed)
Notes Recorded by Herby Abraham, MD on 07/16/2012 at 7:03 PM I called and spoke with the patient and made the following recommendations: 1. Stop ASA 2. Continue plavix as he is responsive to this. 3. If he stops plavix for a procedure, he should take ASA during this period.   He acknowledged understanding of the above recommendations. Will forward to ammend chart

## 2012-07-30 ENCOUNTER — Encounter (HOSPITAL_COMMUNITY): Payer: Self-pay | Admitting: *Deleted

## 2012-07-30 ENCOUNTER — Observation Stay (HOSPITAL_COMMUNITY)
Admission: EM | Admit: 2012-07-30 | Discharge: 2012-08-01 | Disposition: A | Discharge status: Home / Self Care | Payer: Medicare Other | Attending: Internal Medicine | Admitting: Internal Medicine

## 2012-07-30 DIAGNOSIS — R1310 Dysphagia, unspecified: Principal | ICD-10-CM | POA: Diagnosis present

## 2012-07-30 DIAGNOSIS — E119 Type 2 diabetes mellitus without complications: Secondary | ICD-10-CM | POA: Diagnosis present

## 2012-07-30 DIAGNOSIS — K269 Duodenal ulcer, unspecified as acute or chronic, without hemorrhage or perforation: Secondary | ICD-10-CM | POA: Insufficient documentation

## 2012-07-30 DIAGNOSIS — I10 Essential (primary) hypertension: Secondary | ICD-10-CM | POA: Diagnosis present

## 2012-07-30 DIAGNOSIS — T18128A Food in esophagus causing other injury, initial encounter: Secondary | ICD-10-CM

## 2012-07-30 DIAGNOSIS — I25810 Atherosclerosis of coronary artery bypass graft(s) without angina pectoris: Secondary | ICD-10-CM

## 2012-07-30 DIAGNOSIS — Z7902 Long term (current) use of antithrombotics/antiplatelets: Secondary | ICD-10-CM | POA: Insufficient documentation

## 2012-07-30 DIAGNOSIS — Z79899 Other long term (current) drug therapy: Secondary | ICD-10-CM | POA: Insufficient documentation

## 2012-07-30 DIAGNOSIS — K219 Gastro-esophageal reflux disease without esophagitis: Secondary | ICD-10-CM

## 2012-07-30 DIAGNOSIS — K222 Esophageal obstruction: Secondary | ICD-10-CM

## 2012-07-30 DIAGNOSIS — W44F3XA Food entering into or through a natural orifice, initial encounter: Secondary | ICD-10-CM

## 2012-07-30 DIAGNOSIS — R112 Nausea with vomiting, unspecified: Secondary | ICD-10-CM

## 2012-07-30 DIAGNOSIS — I2510 Atherosclerotic heart disease of native coronary artery without angina pectoris: Secondary | ICD-10-CM | POA: Insufficient documentation

## 2012-07-30 LAB — POCT I-STAT TROPONIN I: Troponin i, poc: 0.01 ng/mL (ref 0.00–0.08)

## 2012-07-30 MED ORDER — NITROGLYCERIN 0.4 MG SL SUBL
0.4000 mg | SUBLINGUAL_TABLET | SUBLINGUAL | Status: DC | PRN
  Administered 2012-07-30: 0.4 mg via SUBLINGUAL
  Filled 2012-07-30: qty 25

## 2012-07-30 NOTE — ED Provider Notes (Signed)
History     CSN: [id number]  Arrival date & time 07/30/12  2047   First MD Initiated Contact with Patient 07/30/12 2254      Chief Complaint  Patient presents with  . difficulty swallowing     (Consider location/radiation/quality/duration/timing/severity/associated sxs/prior treatment) HPIFranklin W Arroyo is a very pleasant 78 y.o. male with a history of esophageal stricture who was last dilated by Dr. Arlyce Dice in February, patient says he has been fine until Sunday when he ate a sausage biscuit and felt like it got stuck. This resolved by itself, but symptoms recurred yesterday about 1:00 after eating grilled chicken sandwich at [facility] which he followed up with a Frosty-this did not go down, had nausea, some fullness and pressure in the center of his chest since then, he's attempted to take TUMS, or water, but has had continued regurgitation nausea and pressure. He says this pain is a heaviness is 4/10, nonradiating, not associated shortness of breath, not associated diaphoresis dizziness. He is also complaining about some left-sided chest pressures for weeks, he says he gets my heart. He does have a pertinent coronary artery disease history, having had CABG and status post 2 stents-last of which was 5 years ago. His cardiologist is Dr. Riley Kill.  Review of systems he does complain of having some lower abdominal pain which is been mild and intermittent, and some runny nose.   Past Medical History  Diagnosis Date  . Dyslipidemia   . HTN (hypertension)   . DM (diabetes mellitus)   . Occlusion and stenosis of carotid artery without mention of cerebral infarction   . Coronary atherosclerosis of artery bypass graft   . H/O: gout   . Diverticulitis   . Colon polyps   . Blood transfusion   . GERD (gastroesophageal reflux disease)   . Hyperlipidemia   . Renal insufficiency   . Bronchitis   . Stricture and stenosis of esophagus 12/04/2011  . Esophageal reflux 12/04/2011    Past Surgical  History  Procedure Date  . Cabg 1993  . Coronary stent placement July 2005    taxus stent of the distal right coronary artery   . Elbow surgery     right  . Tonsillectomy   . Back surgery     Family History  Problem Relation Age of Onset  . Ovarian cancer Mother   . Diabetes Mother   . Diabetes Brother   . Heart disease Maternal Grandfather   . Colon cancer Neg Hx   . Esophageal cancer Neg Hx   . Stomach cancer Neg Hx     History  Substance Use Topics  . Smoking status: Former Games developer  . Smokeless tobacco: Never Used   Comment: quit 1 year ago (2011?). has a 40 pack-year history   . Alcohol Use: 2.4 oz/week    4 Shots of liquor per week     social      Review of Systems At least 10pt or greater review of systems completed and are negative except where specified in the HPI.  Allergies  Review of patient's allergies indicates no known allergies.  Home Medications   Current Outpatient Rx  Name Route Sig Dispense Refill  . AMLODIPINE BESYLATE 5 MG PO TABS Oral Take 2.5-5 mg by mouth 2 (two) times daily. Take 5mg  in the am and 2.5mg  in the pm    . BUDESONIDE-FORMOTEROL FUMARATE 160-4.5 MCG/ACT IN AERO Inhalation Inhale 2 puffs into the lungs 2 (two) times daily.     Marland Kitchen  CLOPIDOGREL BISULFATE 75 MG PO TABS Oral Take 75 mg by mouth daily.      Marland Kitchen EZETIMIBE 10 MG PO TABS Oral Take 10 mg by mouth at bedtime.     Marland Kitchen FAMOTIDINE 10 MG PO TABS Oral Take 10 mg by mouth daily.     Marland Kitchen HYDROCHLOROTHIAZIDE 25 MG PO TABS Oral Take 25 mg by mouth daily.      . MELOXICAM 7.5 MG PO TABS Oral Take 7.5 mg by mouth at bedtime.     Marland Kitchen METFORMIN HCL 500 MG PO TABS Oral Take 500 mg by mouth daily with breakfast.    . METOPROLOL TARTRATE 50 MG PO TABS Oral Take 50 mg by mouth 2 (two) times daily.      . CVS SPECTRAVITE SENIOR PO TABS Oral Take 1 tablet by mouth daily.      Marland Kitchen NITROGLYCERIN 0.4 MG SL SUBL Sublingual Place 0.4 mg under the tongue every 5 (five) minutes as needed. For chest pain    .  ROSUVASTATIN CALCIUM 10 MG PO TABS Oral Take 5 mg by mouth every morning.       BP 155/64  Pulse 89  Temp 98.2 F (36.8 C) (Oral)  Resp 16  SpO2 94%  Physical Exam  Nursing notes reviewed.  Electronic medical record reviewed. VITAL SIGNS:   Filed Vitals:   07/31/12 0130 07/31/12 0145 07/31/12 0200 07/31/12 0243  BP: 166/70 145/64 107/95 120/73  Pulse: 97 87 92 85  Temp:    97.4 F (36.3 C)  TempSrc:    Oral  Resp: 16 22 16 16   SpO2: 93% 92% 91% 91%   CONSTITUTIONAL: Awake, oriented, appears non-toxic, frequently spitting HENT: Atraumatic, normocephalic, oral mucosa pink and moist, airway patent. Nares patent without drainage. External ears normal. EYES: Conjunctiva clear, EOMI, PERRLA NECK: Trachea midline, non-tender, supple CARDIOVASCULAR: Normal heart rate, Normal rhythm, No murmurs, rubs, gallops PULMONARY/CHEST: Clear to auscultation, no rhonchi, wheezes, or rales. Symmetrical breath sounds. Non-tender. ABDOMINAL: Non-distended, soft, non-tender - no rebound or guarding.  BS normal. NEUROLOGIC: Non-focal, moving all four extremities, no gross sensory or motor deficits. EXTREMITIES: No clubbing, cyanosis, or edema SKIN: Warm, Dry, No erythema, No rash  ED Course  Procedures (including critical care time)  Date: 07/31/2012  Rate: 72  Rhythm: normal sinus rhythm  QRS Axis: normal  Intervals: normal  ST/T Wave abnormalities: normal  Conduction Disutrbances: none  Narrative Interpretation: unremarkable - nonischemic EKG     Labs Reviewed  BASIC METABOLIC PANEL - Abnormal; Notable for the following:    BUN 36 (*)     GFR calc non Af Amer 53 (*)     GFR calc Af Amer 61 (*)     All other components within normal limits  POCT I-STAT TROPONIN I  CBC   Dg Chest 2 View  07/31/2012  *RADIOLOGY REPORT*  Clinical Data: Food impaction.  The patient feels as if food is stuck in the esophagus.  History of esophageal stricture and dilation.  CHEST - 2 VIEW  Comparison:  Two-view chest 02/21/2012.  Findings: The patient is status post median sternotomy for CABG. Borderline cardiac enlargement is stable.  Emphysematous changes are present.  Mild chronic interstitial coarsening is stable. Atherosclerotic calcifications are again noted.  No focal airspace disease or aspiration is evident.  The visualized soft tissues and bony thorax are unremarkable.  IMPRESSION:  1.  No acute cardiopulmonary disease or significant interval change. 2.  Borderline cardiomegaly without failure. 3.  Status post  median sternotomy for CABG.   Original Report Authenticated By: Jamesetta Orleans. MATTERN, M.D.      1. Food impaction of esophagus   2. Stricture and stenosis of esophagus   3. Nausea and vomiting   4. Dysphagia   5. Type II or unspecified type diabetes mellitus without mention of complication, not stated as uncontrolled   6. Unspecified essential hypertension       MDM  TYON CERASOLI is a 78 y.o. male presenting with food impaction. Will attempt to dislodge using coke, nitroglycerin. Also obtain an EKG and i-STAT troponin.  EKG is unremarkable, i-STAT troponin is within normal limits. Cocaine did not work and did not dislodge the patient's food bolus he still has sensation of pressure and fullness in his epigastrium and middle of his chest - try glucagon and nitroglycerin plus Coca-Cola which also did not work.  Discussed with hospitalist for admission - 07/31/2012 3:06 AM so discussed with Dr. Leafy Ro gastroenterology for scoping tomorrow. He is n.p.o., and pain medicine and nausea medicine as needed. Fluids ordered.             Jones Skene, MD 07/31/12 [phone]

## 2012-07-30 NOTE — ED Notes (Signed)
Pt has hx of difficulty swallowing and food getting suck. Pt had an endoscopy and dilation of esphagus back in Feb of this year. Pt states that Sunday he started having the difficulty swallowing sensation and throughout the past couple of days has continued to having the same sensation after eating. Pt denies difficulty breathing or allergic type symptosm.

## 2012-07-30 NOTE — ED Notes (Signed)
Coke given 5 mins after nitro. sl

## 2012-07-30 NOTE — ED Notes (Signed)
Pt states that Dr did endoscopy in February 2013 this year to "stretch" esophagus. Pt states hasn't had problem since.

## 2012-07-30 NOTE — ED Notes (Signed)
Pt states when he swallows it will only go down so far.

## 2012-07-30 NOTE — ED Notes (Signed)
Pt unable to keep coke down after drinking. Pt vomited LG amount.

## 2012-07-31 ENCOUNTER — Encounter (HOSPITAL_COMMUNITY): Payer: Self-pay | Admitting: Internal Medicine

## 2012-07-31 ENCOUNTER — Encounter (HOSPITAL_COMMUNITY)
Admission: EM | Disposition: A | Discharge status: Home / Self Care | Payer: Self-pay | Source: Home / Self Care | Attending: Emergency Medicine

## 2012-07-31 ENCOUNTER — Emergency Department (HOSPITAL_COMMUNITY): Payer: Medicare Other

## 2012-07-31 DIAGNOSIS — I10 Essential (primary) hypertension: Secondary | ICD-10-CM

## 2012-07-31 DIAGNOSIS — I25810 Atherosclerosis of coronary artery bypass graft(s) without angina pectoris: Secondary | ICD-10-CM

## 2012-07-31 DIAGNOSIS — E119 Type 2 diabetes mellitus without complications: Secondary | ICD-10-CM

## 2012-07-31 DIAGNOSIS — R1310 Dysphagia, unspecified: Secondary | ICD-10-CM

## 2012-07-31 DIAGNOSIS — K222 Esophageal obstruction: Secondary | ICD-10-CM

## 2012-07-31 DIAGNOSIS — K219 Gastro-esophageal reflux disease without esophagitis: Secondary | ICD-10-CM

## 2012-07-31 HISTORY — PX: ESOPHAGOGASTRODUODENOSCOPY: SHX5428

## 2012-07-31 LAB — CBC WITH DIFFERENTIAL/PLATELET
Basophils Absolute: 0.1 10*3/uL (ref 0.0–0.1)
Eosinophils Absolute: 0.3 10*3/uL (ref 0.0–0.7)
Eosinophils Relative: 3 % (ref 0–5)
Lymphs Abs: 2.6 10*3/uL (ref 0.7–4.0)
MCH: 31 pg (ref 26.0–34.0)
MCHC: 34.5 g/dL (ref 30.0–36.0)
MCV: 90 fL (ref 78.0–100.0)
Platelets: 254 10*3/uL (ref 150–400)
RDW: 13.6 % (ref 11.5–15.5)

## 2012-07-31 LAB — COMPREHENSIVE METABOLIC PANEL
AST: 20 U/L (ref 0–37)
Albumin: 3.8 g/dL (ref 3.5–5.2)
Alkaline Phosphatase: 76 U/L (ref 39–117)
Chloride: 102 mEq/L (ref 96–112)
Potassium: 3.8 mEq/L (ref 3.5–5.1)
Total Bilirubin: 1.5 mg/dL — ABNORMAL HIGH (ref 0.3–1.2)

## 2012-07-31 LAB — BASIC METABOLIC PANEL
CO2: 25 mEq/L (ref 19–32)
Calcium: 9.8 mg/dL (ref 8.4–10.5)
Chloride: 101 mEq/L (ref 96–112)
Creatinine, Ser: 1.24 mg/dL (ref 0.50–1.35)
GFR calc Af Amer: 61 mL/min — ABNORMAL LOW (ref >90)
Sodium: 141 mEq/L (ref 135–145)

## 2012-07-31 LAB — CBC
MCH: 30.9 pg (ref 26.0–34.0)
MCV: 89.9 fL (ref 78.0–100.0)
Platelets: 305 10*3/uL (ref 150–400)
RBC: 4.46 MIL/uL (ref 4.22–5.81)
RDW: 13.6 % (ref 11.5–15.5)
WBC: 10.5 10*3/uL (ref 4.0–10.5)

## 2012-07-31 LAB — GLUCOSE, CAPILLARY
Glucose-Capillary: 114 mg/dL — ABNORMAL HIGH (ref 70–99)
Glucose-Capillary: 78 mg/dL (ref 70–99)
Glucose-Capillary: 93 mg/dL (ref 70–99)

## 2012-07-31 SURGERY — EGD (ESOPHAGOGASTRODUODENOSCOPY)
Anesthesia: Moderate Sedation

## 2012-07-31 MED ORDER — ONDANSETRON HCL 4 MG/2ML IJ SOLN: 4.0000 mg | Freq: Three times a day (TID) | INTRAMUSCULAR | Status: DC | PRN

## 2012-07-31 MED ORDER — BUDESONIDE-FORMOTEROL FUMARATE 160-4.5 MCG/ACT IN AERO
2.0000 | INHALATION_SPRAY | Freq: Two times a day (BID) | RESPIRATORY_TRACT | Status: DC
  Administered 2012-07-31 – 2012-08-01 (×2): 2 via RESPIRATORY_TRACT
  Filled 2012-07-31 (×2): qty 6

## 2012-07-31 MED ORDER — PANTOPRAZOLE SODIUM 40 MG IV SOLR
40.0000 mg | Freq: Two times a day (BID) | INTRAVENOUS | Status: DC
  Administered 2012-07-31: 40 mg via INTRAVENOUS
  Filled 2012-07-31 (×2): qty 40

## 2012-07-31 MED ORDER — GLUCAGON HCL (RDNA) 1 MG IJ SOLR
1.0000 mg | Freq: Once | INTRAMUSCULAR | Status: AC
  Administered 2012-07-31: 1 mg via INTRAVENOUS
  Filled 2012-07-31: qty 1

## 2012-07-31 MED ORDER — SODIUM CHLORIDE 0.9 % IV SOLN: INTRAVENOUS | Status: DC

## 2012-07-31 MED ORDER — MIDAZOLAM HCL 5 MG/ML IJ SOLN
INTRAMUSCULAR | Status: AC
  Filled 2012-07-31: qty 2

## 2012-07-31 MED ORDER — GI COCKTAIL ~~LOC~~
30.0000 mL | Freq: Three times a day (TID) | ORAL | Status: DC
  Administered 2012-07-31 – 2012-08-01 (×2): 30 mL via ORAL
  Filled 2012-07-31 (×5): qty 30

## 2012-07-31 MED ORDER — ACETAMINOPHEN 650 MG RE SUPP: 650.0000 mg | Freq: Four times a day (QID) | RECTAL | Status: DC | PRN

## 2012-07-31 MED ORDER — INSULIN ASPART 100 UNIT/ML ~~LOC~~ SOLN
0.0000 [IU] | Freq: Three times a day (TID) | SUBCUTANEOUS | Status: DC
  Administered 2012-07-31: 2 [IU] via SUBCUTANEOUS

## 2012-07-31 MED ORDER — NITROGLYCERIN 0.4 MG SL SUBL
0.4000 mg | SUBLINGUAL_TABLET | Freq: Once | SUBLINGUAL | Status: AC
  Administered 2012-07-31: 0.4 mg via SUBLINGUAL

## 2012-07-31 MED ORDER — FENTANYL CITRATE 0.05 MG/ML IJ SOLN
INTRAMUSCULAR | Status: DC | PRN
  Administered 2012-07-31: 20 ug via INTRAVENOUS
  Administered 2012-07-31 (×2): 10 ug via INTRAVENOUS

## 2012-07-31 MED ORDER — PANTOPRAZOLE SODIUM 40 MG PO TBEC
40.0000 mg | DELAYED_RELEASE_TABLET | Freq: Every day | ORAL | Status: DC
  Administered 2012-07-31: 40 mg via ORAL
  Filled 2012-07-31: qty 1

## 2012-07-31 MED ORDER — ACETAMINOPHEN 325 MG PO TABS: 650.0000 mg | ORAL_TABLET | Freq: Four times a day (QID) | ORAL | Status: DC | PRN

## 2012-07-31 MED ORDER — ONDANSETRON HCL 4 MG PO TABS: 4.0000 mg | ORAL_TABLET | Freq: Four times a day (QID) | ORAL | Status: DC | PRN

## 2012-07-31 MED ORDER — ONDANSETRON HCL 4 MG/2ML IJ SOLN: 4.0000 mg | Freq: Four times a day (QID) | INTRAMUSCULAR | Status: DC | PRN

## 2012-07-31 MED ORDER — LACTATED RINGERS IV SOLN
INTRAVENOUS | Status: DC
  Administered 2012-07-31: 05:00:00 via INTRAVENOUS

## 2012-07-31 MED ORDER — HYDROMORPHONE HCL PF 1 MG/ML IJ SOLN: 0.5000 mg | INTRAMUSCULAR | Status: AC | PRN

## 2012-07-31 MED ORDER — FENTANYL CITRATE 0.05 MG/ML IJ SOLN
INTRAMUSCULAR | Status: AC
  Filled 2012-07-31: qty 2

## 2012-07-31 MED ORDER — MIDAZOLAM HCL 10 MG/2ML IJ SOLN
INTRAMUSCULAR | Status: DC | PRN
  Administered 2012-07-31: 1 mg via INTRAVENOUS
  Administered 2012-07-31: 2 mg via INTRAVENOUS
  Administered 2012-07-31: 1 mg via INTRAVENOUS

## 2012-07-31 MED ORDER — BUTAMBEN-TETRACAINE-BENZOCAINE 2-2-14 % EX AERO
INHALATION_SPRAY | CUTANEOUS | Status: DC | PRN
  Administered 2012-07-31: 2 via TOPICAL

## 2012-07-31 MED ORDER — LABETALOL HCL 5 MG/ML IV SOLN
10.0000 mg | INTRAVENOUS | Status: DC | PRN
  Filled 2012-07-31: qty 4

## 2012-07-31 MED ORDER — SODIUM CHLORIDE 0.9 % IJ SOLN
3.0000 mL | Freq: Two times a day (BID) | INTRAMUSCULAR | Status: DC
  Administered 2012-07-31: 3 mL via INTRAVENOUS

## 2012-07-31 NOTE — ED Notes (Signed)
PT unable to keep coke down . Pt vomited LG amount.

## 2012-07-31 NOTE — Interval H&P Note (Signed)
History and Physical Interval Note:  07/31/2012 11:20 AM  Brendan Arroyo  has presented today for surgery, with the diagnosis of food impaction  The various methods of treatment have been discussed with the patient and family. After consideration of risks, benefits and other options for treatment, the patient has consented to  Procedure(s) (LRB) with comments: ESOPHAGOGASTRODUODENOSCOPY (EGD) (N/A) - pt has food impaction, takes plavix, so no plans to dilate. as a surgical intervention .  The patient's history has been reviewed, patient examined, no change in status, stable for surgery.  I have reviewed the patient's chart and labs.  Questions were answered to the patient's satisfaction.     Venita Lick. Russella Dar MD Clementeen Graham

## 2012-07-31 NOTE — Op Note (Signed)
[facility] [address] [place], [address]   ENDOSCOPY PROCEDURE REPORT  PATIENT: Brendan Arroyo, Brendan Arroyo  MR#: [id number] BIRTHDATE: 11-23-1928 , 83  yrs. old GENDER: Male ENDOSCOPIST: Meryl Dare, MD, [facility] PROCEDURE DATE:  07/31/2012 PROCEDURE:  EGD, diagnostic ASA CLASS:     Class III INDICATIONS:  dysphagia. MEDICATIONS: medications were titrated to patient response per physician's verbal order, Fentanyl 50 mcg IV, and Versed 4 mg IV TOPICAL ANESTHETIC: Cetacaine Spray DESCRIPTION OF PROCEDURE: After the risks benefits and alternatives of the procedure were thoroughly explained, informed consent was obtained.  The Pentax Gastroscope [id number] endoscope was introduced through the mouth and advanced to the second portion of the duodenum. Without limitations.  The instrument was slowly withdrawn as the mucosa was fully examined.  ESOPHAGUS: Stricture was found in the lower third of the esophagus. It was ulcerated-suspected pill ulcer or reflux related. The esophagus was otherwise normal.   Could not dilate stricture as pt is on Plavix. STOMACH: The mucosa of the stomach appeared normal.   The gastric folds were normal. DUODENUM: Three non-bleeding shallow and clean-based ulcers, ranging between 3-5 mm in size, were found in the duodenal bulb.   The duodenal mucosa showed no abnormalities in the 2nd part of the duodenum.  Retroflexed views revealed no abnormalities.   The scope was then withdrawn from the patient and the procedure completed.  COMPLICATIONS: There were no complications.  ENDOSCOPIC IMPRESSION: 1.   Ulcerated stricture in the lower third of the esophagus 2.   Three non-bleeding ulcers in the duodenal bulb  RECOMMENDATIONS: 1.  Avoid NSAIDS-likely source of pill ulcer and duodenal ulcers 2.  PPI bid for 4 weeks then qam. Trial of viscous lidocaine before meals. DC Pepcid. 3.  Office appointment with Dr. Arlyce Dice. May need  EGD/dilation off plavix if symptoms do not improve.    eSigned:  Meryl Dare, MD, [facility] 07/31/2012 11:46 AM   CC: [name], MD

## 2012-07-31 NOTE — Progress Notes (Signed)
[facility] PROGRESS NOTE  Brendan Arroyo:[id number] DOB: 21-Feb-1929 DOA: 07/30/2012 PCP: [name], MD   HPI/Subjective: Interviewed before EGD, stiff complaining about not getting anything down.   Assessment/Plan:  Esophageal stricture with ulcers -This is probably what causing his dysphagia, EGD done without palpitation. -Esophageal ulcers, suspected to be secondary to pills. -Recommended esophageal dilatation off of Plavix as outpatient with Dr. Arlyce Dice. -PPI daily, advance diet as tolerated.  Dysphagia -Secondary to above-mentioned esophageal stricture and ulcers. -Dr. Russella Dar recommended esophageal dilatation off of Plavix as outpatient.  Duodenal ulcers -None bleeding, gastroenterology recommended PPI daily.  Diabetes mellitus type 2 -We'll check hemoglobin A1c. -Insulin sliding scale and carbohydrate modified diet.  History of CAD status post CABG -Patient has 5 beats of NSVT, 12-lead EKG showed normal sinus rhythm, he denies any chest pain. -All get cardiac enzymes to rule out ACS.  Code Status: Full Family Communication: His friend is bedside Disposition Plan: EGD today   Consultants:  Dr. Russella Dar, [facility] gastroenterology  Procedures:  EGD done 10/30 2013 showed ulcerative stricture in the lower third of the esophagus, there 3 nonbleeding ulcers in the duodenal bulb.  Antibiotics:  None   Objective: Filed Vitals:   07/31/12 1133 07/31/12 1138 07/31/12 1145 07/31/12 1215  BP: 130/61 108/42 108/42 145/112  Pulse:  89    Temp:  98.1 F (36.7 C)    TempSrc:  Oral    Resp: 12 23 26 15   Height:      Weight:      SpO2: 92% 95% 95% 97%   No intake or output data in the 24 hours ending 07/31/12 1300 Filed Weights   07/31/12 0452  Weight: 86.5 kg (190 lb 11.2 oz)    Exam:  General: Alert and awake, oriented x3, not in any acute distress. HEENT: anicteric sclera, pupils reactive to light and accommodation, EOMI CVS: S1-S2  clear, no murmur rubs or gallops Chest: clear to auscultation bilaterally, no wheezing, rales or rhonchi Abdomen: soft nontender, nondistended, normal bowel sounds, no organomegaly Extremities: no cyanosis, clubbing or edema noted bilaterally Neuro: Cranial nerves II-XII intact, no focal neurological deficits  Data Reviewed: Basic Metabolic Panel:  Lab 07/31/12 1478 07/31/12 0144  NA 138 141  K 3.8 4.1  CL 102 101  CO2 23 25  GLUCOSE 112* 95  BUN 35* 36*  CREATININE 1.14 1.24  CALCIUM 8.8 9.8  MG -- --  PHOS -- --   Liver Function Tests:  Lab 07/31/12 0525  AST 20  ALT 15  ALKPHOS 76  BILITOT 1.5*  PROT 6.1  ALBUMIN 3.8   No results found for this basename: LIPASE:5,AMYLASE:5 in the last 168 hours No results found for this basename: AMMONIA:5 in the last 168 hours CBC:  Lab 07/31/12 0525 07/31/12 0144  WBC 10.9* 10.5  NEUTROABS 7.0 --  HGB 12.1* 13.8  HCT 35.1* 40.1  MCV 90.0 89.9  PLT 254 305   Cardiac Enzymes: No results found for this basename: CKTOTAL:5,CKMB:5,CKMBINDEX:5,TROPONINI:5 in the last 168 hours BNP (last 3 results) No results found for this basename: PROBNP:3 in the last 8760 hours CBG:  Lab 07/31/12 1245 07/31/12 0755 07/31/12 0457  GLUCAP 78 86 114*    No results found for this or any previous visit (from the past 240 hour(s)).   Studies: Dg Chest 2 View  07/31/2012  *RADIOLOGY REPORT*  Clinical Data: Food impaction.  The patient feels as if food is stuck in the esophagus.  History of esophageal stricture and  dilation.  CHEST - 2 VIEW  Comparison: Two-view chest 02/21/2012.  Findings: The patient is status post median sternotomy for CABG. Borderline cardiac enlargement is stable.  Emphysematous changes are present.  Mild chronic interstitial coarsening is stable. Atherosclerotic calcifications are again noted.  No focal airspace disease or aspiration is evident.  The visualized soft tissues and bony thorax are unremarkable.  IMPRESSION:  1.   No acute cardiopulmonary disease or significant interval change. 2.  Borderline cardiomegaly without failure. 3.  Status post median sternotomy for CABG.   Original Report Authenticated By: Jamesetta Orleans. MATTERN, M.D.     Scheduled Meds:   . budesonide-formoterol  2 puff Inhalation BID  . glucagon  1 mg Intravenous Once  . insulin aspart  0-9 Units Subcutaneous TID WC  . lactated ringers   Intravenous STAT  . nitroGLYCERIN  0.4 mg Sublingual Once  . pantoprazole (PROTONIX) IV  40 mg Intravenous Q12H  . sodium chloride  3 mL Intravenous Q12H   Continuous Infusions:   . sodium chloride    . sodium chloride      Principal Problem:  *Dysphagia Active Problems:  DIABETES MELLITUS  HYPERTENSION  CAD, ARTERY BYPASS GRAFT    Time spent: 35 minutes.    [facility] A  [facility] Pager [phone] If 8PM-8AM, please contact night-coverage at [email or web address], password [email or web address] 07/31/2012, 1:00 PM  LOS: 1 day

## 2012-07-31 NOTE — H&P (Signed)
Brendan Arroyo is an 78 y.o. male.   Patient was seen and examined on July 31, 2012. PCP - Dr. Lysbeth Galas. Cardiologist - Dr. Riley Kill. Gastroenterologist - Dr. Arlyce Dice. Chief Complaint: Difficulty swallowing. HPI: 78 year-old male with history of CAD status post CABG, diabetes mellitus type 2, hypertension who has had esophageal stricture which was dilated in February of this year presents with complaints of difficulty swallowing since yesterday afternoon. Patient had a chicken sandwich after which he had pain and difficulty swallowing. He had to throw up couple of times to bring out what ever he swallowed. Since the discomfort and difficulty persisted he came to the ER. EKG and cardiac enzymes were negative chest x-ray is unremarkable. In the ER here patient had given him nitroglycerin and glucagon which did not help much with his dysphagia and at this time has been admitted for further management. Denies any shortness of breath fever chills cough or phlegm.  Past Medical History  Diagnosis Date  . Dyslipidemia   . HTN (hypertension)   . DM (diabetes mellitus)   . Occlusion and stenosis of carotid artery without mention of cerebral infarction   . Coronary atherosclerosis of artery bypass graft   . H/O: gout   . Diverticulitis   . Colon polyps   . Blood transfusion   . GERD (gastroesophageal reflux disease)   . Hyperlipidemia   . Renal insufficiency   . Bronchitis   . Stricture and stenosis of esophagus 12/04/2011  . Esophageal reflux 12/04/2011    Past Surgical History  Procedure Date  . Cabg 1993  . Coronary stent placement July 2005    taxus stent of the distal right coronary artery   . Elbow surgery     right  . Tonsillectomy   . Back surgery     Family History  Problem Relation Age of Onset  . Ovarian cancer Mother   . Diabetes Mother   . Diabetes Brother   . Heart disease Maternal Grandfather   . Colon cancer Neg Hx   . Esophageal cancer Neg Hx   . Stomach cancer Neg  Hx    Social History:  reports that he has quit smoking. He has never used smokeless tobacco. He reports that he drinks about 2.4 ounces of alcohol per week. He reports that he does not use illicit drugs.  Allergies: No Known Allergies   (Not in a hospital admission)  Results for orders placed during the hospital encounter of 07/30/12 (from the past 48 hour(s))  POCT I-STAT TROPONIN I     Status: Normal   Collection Time   07/30/12 11:32 PM      Component Value Range Comment   Troponin i, poc 0.01  0.00 - 0.08 ng/mL    Comment 3            CBC     Status: Normal   Collection Time   07/31/12  1:44 AM      Component Value Range Comment   WBC 10.5  4.0 - 10.5 K/uL    RBC 4.46  4.22 - 5.81 MIL/uL    Hemoglobin 13.8  13.0 - 17.0 g/dL    HCT 47.8  29.5 - 62.1 %    MCV 89.9  78.0 - 100.0 fL    MCH 30.9  26.0 - 34.0 pg    MCHC 34.4  30.0 - 36.0 g/dL    RDW 30.8  65.7 - 84.6 %    Platelets 305  150 - 400  K/uL   BASIC METABOLIC PANEL     Status: Abnormal   Collection Time   07/31/12  1:44 AM      Component Value Range Comment   Sodium 141  135 - 145 mEq/L    Potassium 4.1  3.5 - 5.1 mEq/L    Chloride 101  96 - 112 mEq/L    CO2 25  19 - 32 mEq/L    Glucose, Bld 95  70 - 99 mg/dL    BUN 36 (*) 6 - 23 mg/dL    Creatinine, Ser 7.82  0.50 - 1.35 mg/dL    Calcium 9.8  8.4 - 95.6 mg/dL    GFR calc non Af Amer 53 (*) >90 mL/min    GFR calc Af Amer 61 (*) >90 mL/min    Dg Chest 2 View  07/31/2012  *RADIOLOGY REPORT*  Clinical Data: Food impaction.  The patient feels as if food is stuck in the esophagus.  History of esophageal stricture and dilation.  CHEST - 2 VIEW  Comparison: Two-view chest 02/21/2012.  Findings: The patient is status post median sternotomy for CABG. Borderline cardiac enlargement is stable.  Emphysematous changes are present.  Mild chronic interstitial coarsening is stable. Atherosclerotic calcifications are again noted.  No focal airspace disease or aspiration is  evident.  The visualized soft tissues and bony thorax are unremarkable.  IMPRESSION:  1.  No acute cardiopulmonary disease or significant interval change. 2.  Borderline cardiomegaly without failure. 3.  Status post median sternotomy for CABG.   Original Report Authenticated By: Jamesetta Orleans. MATTERN, M.D.     Review of Systems  Constitutional: Negative.   HENT: Negative.   Eyes: Negative.   Respiratory: Negative.   Cardiovascular: Negative.   Gastrointestinal:       Difficulty swallowing.  Genitourinary: Negative.   Musculoskeletal: Negative.   Skin: Negative.   Neurological: Negative.   Endo/Heme/Allergies: Negative.   Psychiatric/Behavioral: Negative.     Blood pressure 120/73, pulse 85, temperature 97.4 F (36.3 C), temperature source Oral, resp. rate 16, SpO2 91.00%. Physical Exam  Constitutional: He is oriented to person, place, and time. He appears well-developed and well-nourished. No distress.  HENT:  Head: Normocephalic and atraumatic.  Right Ear: External ear normal.  Left Ear: External ear normal.  Nose: Nose normal.  Mouth/Throat: Oropharynx is clear and moist. No oropharyngeal exudate.  Eyes: Conjunctivae normal are normal. Pupils are equal, round, and reactive to light. Right eye exhibits no discharge. Left eye exhibits no discharge. No scleral icterus.  Neck: Normal range of motion. Neck supple.  Cardiovascular: Normal rate and regular rhythm.   Respiratory: Effort normal and breath sounds normal. No respiratory distress. He has no wheezes. He has no rales.  GI: Soft. Bowel sounds are normal. He exhibits no distension. There is no tenderness. There is no rebound.  Musculoskeletal: He exhibits no edema and no tenderness.  Neurological: He is alert and oriented to person, place, and time.  Skin: Skin is warm and dry. He is not diaphoretic.     Assessment/Plan #1. Dysphagia with history of his history of esophageal stricture requiring dilation this February -  patient will be kept n.p.o. Consult Dr. Arlyce Dice patient's gastroenterologist in a.m. for possible esophageal dilatation. #2. Diabetes mellitus2 - since patient is n.p.o. check CBG q. 4 with sliding-scale coverage. #3. Hypertension - for now since patient on clear have placed patient on when necessary IV labetalol for systolic blood pressure more than 160. #4. History of CAD status post  CABG - cardiac enzymes and EKG are unremarkable.  CODE STATUS - full code.  Donyell Carrell N. 07/31/2012, 2:57 AM

## 2012-07-31 NOTE — Consult Note (Signed)
[facility] Consult: 8:40 AM 07/31/2012   Referring Provider: Dr Toniann Fail Primary Care Physician:  [name], MD Primary Gastroenterologist:  Dr. [name]  Reason for Consultation:  Food impaction, dysphagia.   HPI: Brendan Arroyo is a 78 y.o. male.  S/P CABG 2003, PCI with drug eluting stents placed 2005 and 2008, carotid stenosis, NIDDM. GI hx significant for diloation of esoph stricture 11/2011 with reolution of dysphagia, esophagitis and duodenitis also noted.  Pathology on esophagus read as pill esophagitis.  He takes Pepcid, Plavix, Mobic. No longer using Omeprazole.  Admitted yesterday 10/29 in evening with difficulty swallowing.  It began Sunday the 27th after eating bacon & egg sandwich he was unable to swallow.  Did not eat for the rest of the day.  On Monday ate 2 meals without trouble.  Recurrent dysphagia, inability to swallow after chicken sandwich at 1:30 pm Tuesday.  Came to ED last noc, no relief from food impaction after SL NTG, IV Glucagon he was still unable to swallow liquids.  Having some discomfort in chest that radiates to left.  No SOB, no cough. No nausea.     Pt has not had intermittent dysphagia since EGD in February.   Past Medical History  Diagnosis Date  . Dyslipidemia   . HTN (hypertension)   . DM (diabetes mellitus)   . Occlusion and stenosis of carotid artery without mention of cerebral infarction   . Coronary atherosclerosis of artery bypass graft   . H/O: gout   . Diverticulitis   . Colon polyps   . Blood transfusion   . GERD (gastroesophageal reflux disease)   . Hyperlipidemia   . Renal insufficiency   . Bronchitis   . Stricture and stenosis of esophagus 12/04/2011  . Esophageal reflux 12/04/2011    Past Surgical History  Procedure Date  . Cabg 1993  . Coronary stent placement July 2005    taxus stent of the distal right coronary artery   . Elbow surgery     right  .  Tonsillectomy   . Back surgery     Prior to Admission medications   Medication Sig Start Date End Date Taking? Authorizing Provider  amLODipine (NORVASC) 5 MG tablet Take 2.5-5 mg by mouth 2 (two) times daily. Take 5mg  in the am and 2.5mg  in the pm   Yes Historical Provider, MD  budesonide-formoterol (SYMBICORT) 160-4.5 MCG/ACT inhaler Inhale 2 puffs into the lungs 2 (two) times daily.    Yes Historical Provider, MD  clopidogrel (PLAVIX) 75 MG tablet Take 75 mg by mouth daily.     Yes Historical Provider, MD  ezetimibe (ZETIA) 10 MG tablet Take 10 mg by mouth at bedtime.    Yes Historical Provider, MD  famotidine (PEPCID AC) 10 MG tablet Take 10 mg by mouth daily.  07/08/12  Yes Herby Abraham, MD  hydrochlorothiazide 25 MG tablet Take 25 mg by mouth daily.     Yes Historical Provider, MD  meloxicam (MOBIC) 7.5 MG tablet Take 7.5 mg by mouth at bedtime.    Yes Historical Provider, MD  metFORMIN (GLUCOPHAGE) 500 MG tablet Take 500 mg by mouth daily with breakfast.   Yes Historical Provider, MD  metoprolol (LOPRESSOR) 50 MG tablet Take 50 mg by mouth 2 (two) times daily.     Yes Historical Provider, MD  Multiple Vitamins-Minerals ([employer]S SPECTRAVITE SENIOR) TABS Take 1 tablet by mouth daily.     Yes Historical Provider, MD  nitroGLYCERIN (NITROSTAT) 0.4 MG SL tablet Place  0.4 mg under the tongue every 5 (five) minutes as needed. For chest pain   Yes Historical Provider, MD  rosuvastatin (CRESTOR) 10 MG tablet Take 5 mg by mouth every morning.    Yes Historical Provider, MD    Scheduled Meds:    . budesonide-formoterol  2 puff Inhalation BID  . glucagon  1 mg Intravenous Once  . insulin aspart  0-9 Units Subcutaneous TID WC  . lactated ringers   Intravenous STAT  . nitroGLYCERIN  0.4 mg Sublingual Once  . pantoprazole (PROTONIX) IV  40 mg Intravenous Q12H  . sodium chloride  3 mL Intravenous Q12H   Infusions:    . sodium chloride     PRN Meds: acetaminophen, acetaminophen,  HYDROmorphone (DILAUDID) injection, labetalol, nitroGLYCERIN, ondansetron (ZOFRAN) IV, ondansetron, DISCONTD: ondansetron (ZOFRAN) IV   Allergies as of 07/30/2012  . (No Known Allergies)    Family History  Problem Relation Age of Onset  . Ovarian cancer Mother   . Diabetes Mother   . Diabetes Brother   . Heart disease Maternal Grandfather   . Colon cancer Neg Hx   . Esophageal cancer Neg Hx   . Stomach cancer Neg Hx     History   Social History  . Marital Status: Married    Spouse Name: N/A    Number of Children: 1  . Years of Education: N/A   Occupational History  . retired    Social History Main Topics  . Smoking status: Former Games developer  . Smokeless tobacco: Never Used   Comment: quit 1 year ago (2011?). has a 40 pack-year history   . Alcohol Use: 2.4 oz/week    4 Shots of liquor per week.  Drinks one vodka cocktail daily     social  . Drug Use: No  . Sexually Active: Not on file   Social History Narrative   Lives in [place] and is retired. Tries to eat a heart health diet and exercises regularly.     REVIEW OF SYSTEMS: Constitutional:  5 # weight gain over several months ENT:  No nose bleeds Pulm:  No cough, no dyspnea.  Participates in country dancing 3 times or so weekly.  No exercise limits. CV:  Left chest pain a/w the food impaction.  No palpitations.  Dependent ankle edema, relieves after getting off his feet.  GU:  No urgency no hematuria GI:  As above.  Daily BMs.  Heme:  No anemia hx.    Transfusions:  None ever Neuro:  No headaches, no dizziness.  No gait/balance problems.  Derm:  No rash or itching Endocrine:  No urinary frequency.  Immunization:  Flu shot current.   Travel:  None    PHYSICAL EXAM: Vital signs in last 24 hours: Temp:  [97.4 F (36.3 C)-98.2 F (36.8 C)] 97.5 F (36.4 C) (10/30 0452) Pulse Rate:  [73-107] 90  (10/30 0452) Resp:  [12-28] 16  (10/30 0452) BP: (107-186)/(60-100) 148/62 mmHg (10/30 0452) SpO2:  [88 %-95  %] 92 % (10/30 0452) Weight:  [86.5 kg (190 lb 11.2 oz)] 86.5 kg (190 lb 11.2 oz) (10/30 0452)  General: healthy, well-appearing elderly WM.  NAD Head:  No edema, face symmetric  Eyes:  No icterus or pallor.  EOMI Ears:  Slightly HOH  Nose:  No discharge or congestion.  Mouth:  No lesions, full dentures in place.  Moist MM Neck:  No mass, no JVD,  Lungs:  Clear B.  No cough.  Heart: RRR.  No MRG.  No chest wall tenderness Abdomen:  Soft, BS hypoactive.  ND, NT.  No HSM.  No mass. .   Rectal: deferred   Musc/Skeltl: arthritic changes in fingers, knees Extremities:  No pedal edema  Neurologic:  Pleasant, fully oriented, no tremor.  Moves all 4s.  Skin:  No rash, no sores Tattoos:  none Nodes:  No cervical adenopathy.    Psych:  Pleasant, relaxed.    LAB RESULTS:  Basename 07/31/12 0525 07/31/12 0144  WBC 10.9* 10.5  HGB 12.1* 13.8  HCT 35.1* 40.1  PLT 254 305   BMET Lab Results  Component Value Date   NA 138 07/31/2012   NA 141 07/31/2012   NA 141 05/16/2009   K 3.8 07/31/2012   K 4.1 07/31/2012   K 4.2 05/16/2009   CL 102 07/31/2012   CL 101 07/31/2012   CL 103 05/16/2009   CO2 23 07/31/2012   CO2 25 07/31/2012   CO2 30 05/16/2009   GLUCOSE 112* 07/31/2012   GLUCOSE 95 07/31/2012   GLUCOSE 154* 05/16/2009   BUN 35* 07/31/2012   BUN 36* 07/31/2012   BUN 29* 05/16/2009   CREATININE 1.14 07/31/2012   CREATININE 1.24 07/31/2012   CREATININE 1.0 05/16/2009   CALCIUM 8.8 07/31/2012   CALCIUM 9.8 07/31/2012   CALCIUM 8.9 05/16/2009   LFT  Basename 07/31/12 0525  PROT 6.1  ALBUMIN 3.8  AST 20  ALT 15  ALKPHOS 76  BILITOT 1.5*  BILIDIR --  IBILI --    RADIOLOGY STUDIES: Dg Chest 2 View 07/31/2012  *RADIOLOGY REPORT*  Clinical Data: Food impaction.  The patient feels as if food is stuck in the esophagus.  History of esophageal stricture and dilation.  CHEST - 2 VIEW  Comparison: Two-view chest 02/21/2012.  Findings: The patient is status post median sternotomy  for CABG. Borderline cardiac enlargement is stable.  Emphysematous changes are present.  Mild chronic interstitial coarsening is stable. Atherosclerotic calcifications are again noted.  No focal airspace disease or aspiration is evident.  The visualized soft tissues and bony thorax are unremarkable.  IMPRESSION:  1.  No acute cardiopulmonary disease or significant interval change. 2.  Borderline cardiomegaly without failure. 3.  Status post median sternotomy for CABG.   Original Report Authenticated By: Jamesetta Orleans. MATTERN, M.D.     ENDOSCOPIC STUDIES:  *  EGD  11/06/2011  For Dysphagia Moderate distal esoph stricture dilated to 18mm Maloney, desquamative esophagitis, duodenitis.  Surgical [P], esophageal, bx - ESOPHAGEAL SQUAMOUS MUCOSA WITH SLIGHT BASAL CELL HYPERPLASIA, INCREASED INTERCELLULAR EDEMA, AND SUPERFICIAL ACUTE INFLAMMATION. - PAS STAIN IS NEGATIVE FOR FUNGAL ORGANISMS. - NO INTESTINAL METAPLASIA, DYSPLASIA OR MALIGNANCY IDENTIFIED. - SEE COMMENT. Microscopic Comment Within the superficial squamous layers there are refractile fragments of foreign material. The findings are most consistent with a pill induced esophagitis.    IMPRESSION: *  Dysphagia.  Acute food impaction. Hx esophageal stricture, dilated 11/2011.  *  CAD.  S/p CABG, S/P DES placement twice. Chronic Plavix *  Arthritis, chronic Mobic.  *  NIDDM *  Chronic kidney dz, mild.  BUN is elevated, he may have become dehydrated due to lack of po intake.   PLAN: *  EGD 11 AM today.  Pt and wife agreeable and eager to proceed.     LOS: 1 day   Jennye Moccasin  07/31/2012, 8:40 AM Pager: [phone]    I have taken a history, examined the patient and reviewed the chart. I agree with Lyn Records note, impression  and recommendations. Possible food impaction, esophagitis or esophageal ulcer. Suspect a recurrent esophageal stricture as well but will not be able to dilate today given Plavix therapy. EGD  today.  Meryl Dare MD [facility]

## 2012-08-01 ENCOUNTER — Encounter (HOSPITAL_COMMUNITY): Payer: Self-pay | Admitting: Gastroenterology

## 2012-08-01 DIAGNOSIS — R112 Nausea with vomiting, unspecified: Secondary | ICD-10-CM

## 2012-08-01 DIAGNOSIS — T18108A Unspecified foreign body in esophagus causing other injury, initial encounter: Secondary | ICD-10-CM

## 2012-08-01 LAB — GLUCOSE, CAPILLARY: Glucose-Capillary: 90 mg/dL (ref 70–99)

## 2012-08-01 MED ORDER — PANTOPRAZOLE SODIUM 40 MG PO TBEC: 40.0000 mg | DELAYED_RELEASE_TABLET | Freq: Two times a day (BID) | ORAL | Status: DC

## 2012-08-01 MED ORDER — PANTOPRAZOLE SODIUM 40 MG PO TBEC: 40.0000 mg | DELAYED_RELEASE_TABLET | Freq: Every day | ORAL | Status: DC

## 2012-08-01 NOTE — Discharge Summary (Signed)
Physician Discharge Summary  Brendan Arroyo ZOX:[id number] DOB: 10-11-1928 DOA: 07/30/2012  PCP: [name], MD  Admit date: 07/30/2012 Discharge date: 08/01/2012  Time spent: 40 minutes  Recommendations for Outpatient Follow-up:  1. Followup with Dr. Arlyce Dice for esophageal dilatation off of Plavix.  Discharge Diagnoses:  Principal Problem:  *Dysphagia Active Problems:  DIABETES MELLITUS  HYPERTENSION  CAD, ARTERY BYPASS GRAFT   Discharge Condition: Stable  Diet recommendation:  soft diet  Filed Weights   07/31/12 0452  Weight: 86.5 kg (190 lb 11.2 oz)    History of present illness:  78 year-old male with history of CAD status post CABG, diabetes mellitus type 2, hypertension who has had esophageal stricture which was dilated in February of this year presents with complaints of difficulty swallowing since yesterday afternoon. Patient had a chicken sandwich after which he had pain and difficulty swallowing. He had to throw up couple of times to bring out what ever he swallowed. Since the discomfort and difficulty persisted he came to the ER. EKG and cardiac enzymes were negative chest x-ray is unremarkable. In the ER here patient had given him nitroglycerin and glucagon which did not help much with his dysphagia and at this time has been admitted for further management. Denies any shortness of breath fever chills cough or phlegm.  Hospital Course:   1. Esophageal stricture and ulcers: Patient given to the hospital complaining about food sticking in his chest, he does have history of esophageal stricture and esophageal dilatation done by Dr. Arlyce Dice as outpatient. Patient was placed n.p.o. and [facility] was called for consultation. EGD was done on 07/31/2012 and showed esophageal stricture and ulcers. The ulcers suspected to be secondary to pills likely NSAID pills. Dr. Russella Dar also recommended esophageal dilatation off of Plavix as outpatient with Dr. Arlyce Dice,  he recommended PPI to feet twice a day for a month then daily. Diet advanced as tolerated and patient tolerated that very well, he was advised to eat soft diet.  2. Dysphagia: Secondary to above-mentioned esophageal stricture and ulcers, Dr. Russella Dar recommended followup was with Dr. Arlyce Dice as outpatient for severe dilatation off of Plavix.  3. Duodenal ulcers: None bleeding, gastroenterology recommended PPI twice a day.  4. Diabetes mellitus type 2: Blood sugars reasonably controlled during this hospital stay, his oral hypoglycemic agents were held at the time of admission because of n.p.o. status, fall were restarted at time of discharge.  5. History of CAD status post CABG: Patient has 5 beats of NSVT, 12-lead EKG showed normal sinus rhythm, he denies any chest pain or shortness of breath. Cardiac enzymes were negative for ACS the  Procedures:   EGD done by Dr. Russella Dar on 07/31/2012 showed ulcerated stricture is in the lower third of the esophagus and 3 nonbleeding ulcers in the duodenal bulb   Consultations:   [facility]  Discharge Exam: Filed Vitals:   07/31/12 1440 07/31/12 2057 08/01/12 0513 08/01/12 0919  BP: 172/66 149/68 159/71   Pulse: 84 64 87   Temp: 98.4 F (36.9 C) 98.2 F (36.8 C) 97.5 F (36.4 C)   TempSrc: Oral Oral Oral   Resp: 18 18 20    Height:      Weight:      SpO2: 95% 91% 90% 94%   General: Alert and awake, oriented x3, not in any acute distress. HEENT: anicteric sclera, pupils reactive to light and accommodation, EOMI CVS: S1-S2 clear, no murmur rubs or gallops Chest: clear to auscultation bilaterally, no wheezing, rales or  rhonchi Abdomen: soft nontender, nondistended, normal bowel sounds, no organomegaly Extremities: no cyanosis, clubbing or edema noted bilaterally Neuro: Cranial nerves II-XII intact, no focal neurological deficits  Discharge Instructions  Discharge Orders    Future Appointments: Provider: Department: Dept Phone: Center:     08/20/2012 9:00 AM [facility] Pv 2 [facility] [phone] None   08/27/2012 1:45 PM Louis Meckel, MD [facility] Office [phone] [facility]     Future Orders Please Complete By Expires   Diet - low sodium heart healthy      Increase activity slowly          Medication List     As of 08/01/2012  9:33 AM    STOP taking these medications         meloxicam 7.5 MG tablet   Commonly known as: MOBIC      PEPCID AC 10 MG tablet   Generic drug: famotidine      TAKE these medications         amLODipine 5 MG tablet   Commonly known as: NORVASC   Take 2.5-5 mg by mouth 2 (two) times daily. Take 5mg  in the am and 2.5mg  in the pm      budesonide-formoterol 160-4.5 MCG/ACT inhaler   Commonly known as: SYMBICORT   Inhale 2 puffs into the lungs 2 (two) times daily.      clopidogrel 75 MG tablet   Commonly known as: PLAVIX   Take 75 mg by mouth daily.      CVS Spectravite Senior Tabs   Take 1 tablet by mouth daily.      ezetimibe 10 MG tablet   Commonly known as: ZETIA   Take 10 mg by mouth at bedtime.      hydrochlorothiazide 25 MG tablet   Commonly known as: HYDRODIURIL   Take 25 mg by mouth daily.      metFORMIN 500 MG tablet   Commonly known as: GLUCOPHAGE   Take 500 mg by mouth daily with breakfast.      metoprolol 50 MG tablet   Commonly known as: LOPRESSOR   Take 50 mg by mouth 2 (two) times daily.      nitroGLYCERIN 0.4 MG SL tablet   Commonly known as: NITROSTAT   Place 0.4 mg under the tongue every 5 (five) minutes as needed. For chest pain      pantoprazole 40 MG tablet   Commonly known as: PROTONIX   Take 1 tablet (40 mg total) by mouth 2 (two) times daily.      rosuvastatin 10 MG tablet   Commonly known as: CRESTOR   Take 5 mg by mouth every morning.           Follow-up Information    Follow up with [name], MD. On 08/27/2012. (1:45 PM)    Contact information:   [address] [place]  [address] [phone]       Follow up with [name], MD. On 08/08/2012. (Appointment with dr. Lysbeth Galas is on 08/08/12 at 10:30, Place woulld like for you to fax D/C summary & tests patient may have had on this visit fax # [phone])    Contact information:   [address] [facility] [address] [phone]           The results of significant diagnostics from this hospitalization (including imaging, microbiology, ancillary and laboratory) are listed below for reference.    Significant Diagnostic Studies: Dg Chest 2 View  07/31/2012  *RADIOLOGY REPORT*  Clinical Data: Food impaction.  The patient feels as if food is stuck in the esophagus.  History of esophageal stricture and dilation.  CHEST - 2 VIEW  Comparison: Two-view chest 02/21/2012.  Findings: The patient is status post median sternotomy for CABG. Borderline cardiac enlargement is stable.  Emphysematous changes are present.  Mild chronic interstitial coarsening is stable. Atherosclerotic calcifications are again noted.  No focal airspace disease or aspiration is evident.  The visualized soft tissues and bony thorax are unremarkable.  IMPRESSION:  1.  No acute cardiopulmonary disease or significant interval change. 2.  Borderline cardiomegaly without failure. 3.  Status post median sternotomy for CABG.   Original Report Authenticated By: Jamesetta Orleans. MATTERN, M.D.     Microbiology: No results found for this or any previous visit (from the past 240 hour(s)).   Labs: Basic Metabolic Panel:  Lab 07/31/12 2536 07/31/12 0144  NA 138 141  K 3.8 4.1  CL 102 101  CO2 23 25  GLUCOSE 112* 95  BUN 35* 36*  CREATININE 1.14 1.24  CALCIUM 8.8 9.8  MG -- --  PHOS -- --   Liver Function Tests:  Lab 07/31/12 0525  AST 20  ALT 15  ALKPHOS 76  BILITOT 1.5*  PROT 6.1  ALBUMIN 3.8   No results found for this basename: LIPASE:5,AMYLASE:5 in the last 168 hours No results found for this basename: AMMONIA:5 in the last  168 hours CBC:  Lab 07/31/12 0525 07/31/12 0144  WBC 10.9* 10.5  NEUTROABS 7.0 --  HGB 12.1* 13.8  HCT 35.1* 40.1  MCV 90.0 89.9  PLT 254 305   Cardiac Enzymes: No results found for this basename: CKTOTAL:5,CKMB:5,CKMBINDEX:5,TROPONINI:5 in the last 168 hours BNP: BNP (last 3 results) No results found for this basename: PROBNP:3 in the last 8760 hours CBG:  Lab 08/01/12 0744 08/01/12 0507 07/31/12 2331 07/31/12 1950 07/31/12 1647  GLUCAP 82 90 93 225* 168*       Signed:  Nayomi Tabron A  [facility] 08/01/2012, 9:33 AM

## 2012-08-01 NOTE — Care Management Note (Signed)
    Page 1 of 1   08/01/2012     11:17:28 AM   CARE MANAGEMENT NOTE 08/01/2012  Patient:  Brendan Arroyo, Brendan Arroyo   Account Number:  [id number]  Date Initiated:  08/01/2012  Documentation initiated by:  Letha Cape  Subjective/Objective Assessment:   dx dysphagia  admit as observation. pta independent.     Action/Plan:   Anticipated DC Date:  08/01/2012   Anticipated DC Plan:  HOME/SELF CARE      DC Planning Services  CM consult      Choice offered to / List presented to:             Status of service:  Completed, signed off Medicare Important Message given?   (If response is "NO", the following Medicare IM given date fields will be blank) Date Medicare IM given:   Date Additional Medicare IM given:    Discharge Disposition:  HOME/SELF CARE  Per UR Regulation:  Reviewed for med. necessity/level of care/duration of stay  If discussed at Long Length of Stay Meetings, dates discussed:    Comments:  08/01/12 11:16 Letha Cape RN, BSN [phone] patient lives alone, pta independent.  no needs anticipated. Patient dc to home.

## 2012-08-01 NOTE — Progress Notes (Signed)
NURSING PROGRESS NOTE  STEVEN BASSO [id number] Discharge Data: 08/01/2012 10:42 AM Attending Provider: Clydia Llano, MD JYN:[id number] Molly Maduro, MD     Glendon Axe to be D/C'd Home per MD order.  Discussed with the patient the After Visit Summary and all questions fully answered. All IV's discontinued with no bleeding noted. All belongings returned to patient for patient to take home.   Last Vital Signs:  Blood pressure 159/71, pulse 87, temperature 97.5 F (36.4 C), temperature source Oral, resp. rate 20, height 5\' 10"  (1.778 m), weight 86.5 kg (190 lb 11.2 oz), SpO2 94.00%.  Discharge Medication List   Medication List     As of 08/01/2012 10:42 AM    STOP taking these medications         meloxicam 7.5 MG tablet   Commonly known as: MOBIC      PEPCID AC 10 MG tablet   Generic drug: famotidine      TAKE these medications         amLODipine 5 MG tablet   Commonly known as: NORVASC   Take 2.5-5 mg by mouth 2 (two) times daily. Take 5mg  in the am and 2.5mg  in the pm      budesonide-formoterol 160-4.5 MCG/ACT inhaler   Commonly known as: SYMBICORT   Inhale 2 puffs into the lungs 2 (two) times daily.      clopidogrel 75 MG tablet   Commonly known as: PLAVIX   Take 75 mg by mouth daily.      [employer]S Spectravite Senior Tabs   Take 1 tablet by mouth daily.      ezetimibe 10 MG tablet   Commonly known as: ZETIA   Take 10 mg by mouth at bedtime.      hydrochlorothiazide 25 MG tablet   Commonly known as: HYDRODIURIL   Take 25 mg by mouth daily.      metFORMIN 500 MG tablet   Commonly known as: GLUCOPHAGE   Take 500 mg by mouth daily with breakfast.      metoprolol 50 MG tablet   Commonly known as: LOPRESSOR   Take 50 mg by mouth 2 (two) times daily.      nitroGLYCERIN 0.4 MG SL tablet   Commonly known as: NITROSTAT   Place 0.4 mg under the tongue every 5 (five) minutes as needed. For chest pain      pantoprazole 40 MG tablet   Commonly known as: PROTONIX     Take 1 tablet (40 mg total) by mouth 2 (two) times daily.      rosuvastatin 10 MG tablet   Commonly known as: CRESTOR   Take 5 mg by mouth every morning.        Bluma Buresh, Elmarie Mainland, RN

## 2012-08-02 ENCOUNTER — Encounter (HOSPITAL_COMMUNITY): Payer: Self-pay

## 2012-08-20 ENCOUNTER — Encounter (INDEPENDENT_AMBULATORY_CARE_PROVIDER_SITE_OTHER): Payer: Medicare Other

## 2012-08-20 DIAGNOSIS — I6529 Occlusion and stenosis of unspecified carotid artery: Secondary | ICD-10-CM

## 2012-08-20 DIAGNOSIS — I25810 Atherosclerosis of coronary artery bypass graft(s) without angina pectoris: Secondary | ICD-10-CM

## 2012-08-27 ENCOUNTER — Encounter: Payer: Self-pay | Admitting: Gastroenterology

## 2012-08-27 ENCOUNTER — Ambulatory Visit (INDEPENDENT_AMBULATORY_CARE_PROVIDER_SITE_OTHER): Payer: Medicare Other | Admitting: Gastroenterology

## 2012-08-27 VITALS — BP 160/68 | HR 51 | Ht 70.0 in | Wt 189.0 lb

## 2012-08-27 DIAGNOSIS — K222 Esophageal obstruction: Secondary | ICD-10-CM

## 2012-08-27 NOTE — Patient Instructions (Addendum)
YOU WILL HOLD  PLAVIX PER DR KAPLAN 5 DAYS BEFORE YOUR PROCEDURE Your procedure has been scheduled Separate instructions have been given

## 2012-08-27 NOTE — Assessment & Plan Note (Signed)
Recent impaction of the distal esophagus secondary to recurrent stricture.  Recommendation #1 repeat upper endoscopy with balloon dilatation. Although he's not having dysphagia, I believe he is at high risk for recurrent impaction. Plavix will be held in advance of the procedure

## 2012-08-27 NOTE — Progress Notes (Signed)
History of Present Illness: Brendan Arroyo 78 year old white male with history of esophageal stricture, on Plavix, who is seen following hospitalization for a food impaction in October, 2013. He was unable to take solids or liquids. Endoscopy demonstrated a distal esophageal stricture. Because he is on Plavix he was not dilated. He's had no further problems with impaction or dysphagia . He was dilated in favor, 2013. It was thought at the time of his recent endoscopy that he may have had a pill esophagitis.  He has not had any intercurrent cardiac events since his last office visit.    Past Medical History  Diagnosis Date  . Dyslipidemia   . HTN (hypertension)   . DM (diabetes mellitus)   . Occlusion and stenosis of carotid artery without mention of cerebral infarction   . Coronary atherosclerosis of artery bypass graft   . H/O: gout   . Diverticulitis   . Colon polyps   . Blood transfusion   . GERD (gastroesophageal reflux disease)   . Hyperlipidemia   . Renal insufficiency   . Bronchitis   . Stricture and stenosis of esophagus 11/06/2011    egd with esoph stricture dilation 11/2011.  [name]  . Esophageal reflux 11/06/2011   Past Surgical History  Procedure Date  . Cabg 1993  . Coronary stent placement July 2005, 2008    taxus stent of the distal right coronary artery   . Elbow surgery     right  . Tonsillectomy   . Back surgery   . Esophagogastroduodenoscopy 07/31/2012    Procedure: ESOPHAGOGASTRODUODENOSCOPY (EGD);  Surgeon: Meryl Dare, MD,FACG;  Location: [facility] ENDOSCOPY;  Service: Endoscopy;  Laterality: N/A;  pt has food impaction, takes plavix, so no plans to dilate.   family history includes Diabetes in his brother and mother; Heart disease in his maternal grandfather; and Ovarian cancer in his mother.  There is no history of Colon cancer, and Esophageal cancer, and Stomach cancer, . Current Outpatient Prescriptions  Medication Sig Dispense Refill  . amLODipine  (NORVASC) 5 MG tablet Take 2.5-5 mg by mouth 2 (two) times daily. Take 5mg  in the am and 2.5mg  in the pm      . budesonide-formoterol (SYMBICORT) 160-4.5 MCG/ACT inhaler Inhale 2 puffs into the lungs 2 (two) times daily.       . clopidogrel (PLAVIX) 75 MG tablet Take 75 mg by mouth daily.        Marland Kitchen ezetimibe (ZETIA) 10 MG tablet Take 10 mg by mouth at bedtime.       . hydrochlorothiazide 25 MG tablet Take 25 mg by mouth daily.        . metFORMIN (GLUCOPHAGE) 500 MG tablet Take 500 mg by mouth daily with breakfast.      . metoprolol (LOPRESSOR) 50 MG tablet Take 50 mg by mouth 2 (two) times daily.        . Multiple Vitamins-Minerals ([employer] SPECTRAVITE SENIOR) TABS Take 1 tablet by mouth daily.        . nitroGLYCERIN (NITROSTAT) 0.4 MG SL tablet Place 0.4 mg under the tongue every 5 (five) minutes as needed. For chest pain      . pantoprazole (PROTONIX) 40 MG tablet Take 1 tablet (40 mg total) by mouth 2 (two) times daily.  60 tablet  0  . rosuvastatin (CRESTOR) 10 MG tablet Take 5 mg by mouth every morning.        Allergies as of 08/27/2012  . (No Known Allergies)    reports that  he has quit smoking. He has never used smokeless tobacco. He reports that he drinks about 2.4 ounces of alcohol per week. He reports that he does not use illicit drugs.     Review of Systems: Pertinent positive and negative review of systems were noted in the above HPI section. All other review of systems were otherwise negative.  Vital signs were reviewed in today's medical record Physical Exam: General: Well developed , well nourished, no acute distress

## 2012-09-02 ENCOUNTER — Telehealth: Payer: Self-pay | Admitting: Gastroenterology

## 2012-09-02 NOTE — Telephone Encounter (Signed)
Appt cancelled with Noreene Larsson at [facility]. Pt will call back to reschedule the appt.

## 2012-09-11 ENCOUNTER — Encounter (HOSPITAL_COMMUNITY): Payer: Self-pay

## 2012-09-11 ENCOUNTER — Ambulatory Visit (HOSPITAL_COMMUNITY): Admit: 2012-09-11 | Payer: Self-pay | Admitting: Gastroenterology

## 2012-09-11 SURGERY — EGD (ESOPHAGOGASTRODUODENOSCOPY)
Anesthesia: Moderate Sedation

## 2012-12-18 ENCOUNTER — Ambulatory Visit (INDEPENDENT_AMBULATORY_CARE_PROVIDER_SITE_OTHER): Payer: Medicare Other | Admitting: Cardiology

## 2012-12-18 ENCOUNTER — Encounter: Payer: Self-pay | Admitting: Cardiology

## 2012-12-18 VITALS — BP 138/56 | HR 53 | Ht 70.0 in | Wt 188.0 lb

## 2012-12-18 DIAGNOSIS — E785 Hyperlipidemia, unspecified: Secondary | ICD-10-CM

## 2012-12-18 DIAGNOSIS — E119 Type 2 diabetes mellitus without complications: Secondary | ICD-10-CM

## 2012-12-18 DIAGNOSIS — I6529 Occlusion and stenosis of unspecified carotid artery: Secondary | ICD-10-CM

## 2012-12-18 DIAGNOSIS — I25810 Atherosclerosis of coronary artery bypass graft(s) without angina pectoris: Secondary | ICD-10-CM

## 2012-12-18 NOTE — Patient Instructions (Addendum)
Your physician wants you to follow-up in:  6 months. You will receive a reminder letter in the mail two months in advance. If you don't receive a letter, please call our office to schedule the follow-up appointment.   

## 2012-12-18 NOTE — Progress Notes (Signed)
HPI:  Patient is in for followup. He was admitted to the hospital with a pneumonia recently, and did cough up some blood. He was placed on antibiotics, and now is dramatically improved. He's not currently having any symptoms, no longer coughing up any blood. We went through in some detail his current anatomic situation, and the decision to continue or not continue dual antiplatelet therapy. He generally has been stable.  He is now on home O2.    Current Outpatient Prescriptions  Medication Sig Dispense Refill  . amLODipine (NORVASC) 5 MG tablet Take 2.5-5 mg by mouth 2 (two) times daily. Take 5mg  in the am and 2.5mg  in the pm      . clopidogrel (PLAVIX) 75 MG tablet Take 75 mg by mouth daily.        Marland Kitchen ezetimibe (ZETIA) 10 MG tablet Take 10 mg by mouth at bedtime.       . hydrochlorothiazide 25 MG tablet Take 25 mg by mouth daily.        . metFORMIN (GLUCOPHAGE) 500 MG tablet Take 500 mg by mouth daily with breakfast.      . metoprolol (LOPRESSOR) 50 MG tablet Take 50 mg by mouth 2 (two) times daily.        . Multiple Vitamins-Minerals ([employer]S SPECTRAVITE SENIOR) TABS Take 1 tablet by mouth daily.        . nitroGLYCERIN (NITROSTAT) 0.4 MG SL tablet Place 0.4 mg under the tongue every 5 (five) minutes as needed. For chest pain      . pantoprazole (PROTONIX) 40 MG tablet Take 1 tablet (40 mg total) by mouth 2 (two) times daily.  60 tablet  0  . rosuvastatin (CRESTOR) 10 MG tablet Take 5 mg by mouth every morning.        No current facility-administered medications for this visit.    No Known Allergies  Past Medical History  Diagnosis Date  . Dyslipidemia   . HTN (hypertension)   . DM (diabetes mellitus)   . Occlusion and stenosis of carotid artery without mention of cerebral infarction   . Coronary atherosclerosis of artery bypass graft   . H/O: gout   . Diverticulitis   . Colon polyps   . Blood transfusion   . GERD (gastroesophageal reflux disease)   . Hyperlipidemia   . Renal  insufficiency   . Bronchitis   . Stricture and stenosis of esophagus 11/06/2011    egd with esoph stricture dilation 11/2011.  [name]  . Esophageal reflux 11/06/2011    Past Surgical History  Procedure Laterality Date  . Cabg  1993  . Coronary stent placement  July 2005, 2008    taxus stent of the distal right coronary artery   . Elbow surgery      right  . Tonsillectomy    . Back surgery    . Esophagogastroduodenoscopy  07/31/2012    Procedure: ESOPHAGOGASTRODUODENOSCOPY (EGD);  Surgeon: Meryl Dare, MD,FACG;  Location: [facility] ENDOSCOPY;  Service: Endoscopy;  Laterality: N/A;  pt has food impaction, takes plavix, so no plans to dilate.    Family History  Problem Relation Age of Onset  . Ovarian cancer Mother   . Diabetes Mother   . Diabetes Brother   . Heart disease Maternal Grandfather   . Colon cancer Neg Hx   . Esophageal cancer Neg Hx   . Stomach cancer Neg Hx     History   Social History  . Marital Status: Married  Spouse Name: N/A    Number of Children: 1  . Years of Education: N/A   Occupational History  . retired    Social History Main Topics  . Smoking status: Former Games developer  . Smokeless tobacco: Never Used     Comment: quit 1 year ago (2011?). has a 40 pack-year history   . Alcohol Use: 2.4 oz/week    4 Shots of liquor per week     Comment: social  . Drug Use: No  . Sexually Active: Not on file   Other Topics Concern  . Not on file   Social History Narrative   Lives in [place] and is retired. Tries to eat a heart health diet and exercises regularly. Only interval medication is multivitamin.     ROS: Please see the HPI.  All other systems reviewed and negative.  PHYSICAL EXAM:  BP 138/56  Pulse 53  Ht 5\' 10"  (1.778 m)  Wt 188 lb (85.276 kg)  BMI 26.98 kg/m2  SpO2 91%  General: Well developed, well nourished, in no acute distress. Head:  Normocephalic and atraumatic. Neck: no JVD Lungs: Clear to auscultation and percussion.   Minimal expiratory prolongation.   Heart: Normal S1 and S2.  No murmur, rubs or gallops.  Pulses: Pulses normal in all 4 extremities. Extremities: No clubbing or cyanosis. No edema. Neurologic: Alert and oriented x 3.  EKG:  SB.  Otherwise normal tracing.    ASSESSMENT AND PLAN:  1.  Discussed underlying issue.  Has LIMA to LAD, has Promus to CFX, and Taxus to distal RCA through SVG.  Underlying DM.  No his of ST.  Likely continued DAPT.   2.  FU Dr. Excell Seltzer in six months.

## 2012-12-20 NOTE — Assessment & Plan Note (Signed)
Managed by Dr. Lysbeth Galas.

## 2012-12-20 NOTE — Assessment & Plan Note (Signed)
Stable bilateral disease.  Continue to monitor.

## 2012-12-20 NOTE — Assessment & Plan Note (Signed)
Followed by primary care ([place])

## 2012-12-20 NOTE — Assessment & Plan Note (Signed)
Remains on medical therapy including DAPT.  Will continue current meds.

## 2013-06-27 ENCOUNTER — Ambulatory Visit (INDEPENDENT_AMBULATORY_CARE_PROVIDER_SITE_OTHER): Payer: Medicare Other | Admitting: Cardiovascular Disease

## 2013-06-27 ENCOUNTER — Encounter: Payer: Self-pay | Admitting: Cardiovascular Disease

## 2013-06-27 VITALS — BP 136/74 | HR 62 | Ht 70.0 in | Wt 198.0 lb

## 2013-06-27 DIAGNOSIS — I25810 Atherosclerosis of coronary artery bypass graft(s) without angina pectoris: Secondary | ICD-10-CM

## 2013-06-27 DIAGNOSIS — I10 Essential (primary) hypertension: Secondary | ICD-10-CM

## 2013-06-27 DIAGNOSIS — R609 Edema, unspecified: Secondary | ICD-10-CM

## 2013-06-27 MED ORDER — FUROSEMIDE 40 MG PO TABS: 40.0000 mg | ORAL_TABLET | Freq: Every day | ORAL | Status: DC | PRN

## 2013-06-27 NOTE — Progress Notes (Signed)
HPI:  79 year old gentleman presenting for followup evaluation. He's been previously followed by Dr. Riley Kill. The patient has had remote angioplasty about 20 years ago. He had CABG in 1993. His last heart catheterization in 2010 demonstrated patency of the LIMA to LAD. He has a prominence DES in the left circumflex and a Taxus DES in the distal right coronary artery at the anastomotic site of the vein graft. He's also been followed for bilateral carotid stenosis. His last duplex was November 2013 demonstrating 40-59% stenosis bilaterally.  Overall the patient is doing fairly well. He had pneumonia this past winter and has had a long recovery from that. He wears nocturnal oxygen. He's made a slow recovery but has some residual shortness of breath. He has episodic leg swelling. He denies orthopnea, PND, or chest pain. He's had no symptoms like those of his prior angina.  Outpatient Encounter Prescriptions as of 06/27/2013  Medication Sig Dispense Refill  . amLODipine (NORVASC) 5 MG tablet Take 2.5-5 mg by mouth 2 (two) times daily. Take 5mg  in the am and 2.5mg  in the pm      . atorvastatin (LIPITOR) 20 MG tablet Take 20 mg by mouth daily.      . clopidogrel (PLAVIX) 75 MG tablet Take 75 mg by mouth daily.        Marland Kitchen gabapentin (NEURONTIN) 300 MG capsule Take 300 mg by mouth daily.      Marland Kitchen guaifenesin (HUMIBID E) 400 MG TABS tablet Take 400 mg by mouth daily.      . hydrochlorothiazide 25 MG tablet Take 25 mg by mouth daily.        . metFORMIN (GLUCOPHAGE) 500 MG tablet Take 500 mg by mouth 2 (two) times daily.       . metoprolol (LOPRESSOR) 50 MG tablet Take 50 mg by mouth 2 (two) times daily.        . Multiple Vitamins-Minerals ([employer]S SPECTRAVITE SENIOR) TABS Take 1 tablet by mouth daily.        . nitroGLYCERIN (NITROSTAT) 0.4 MG SL tablet Place 0.4 mg under the tongue every 5 (five) minutes as needed. For chest pain      . OMEPRAZOLE PO Take by mouth as needed.      . [DISCONTINUED] ezetimibe (ZETIA)  10 MG tablet Take 10 mg by mouth at bedtime.       . [DISCONTINUED] pantoprazole (PROTONIX) 40 MG tablet Take 1 tablet (40 mg total) by mouth 2 (two) times daily.  60 tablet  0  . [DISCONTINUED] rosuvastatin (CRESTOR) 10 MG tablet Take 5 mg by mouth every morning.        No facility-administered encounter medications on file as of 06/27/2013.    No Known Allergies  Past Medical History  Diagnosis Date  . Dyslipidemia   . HTN (hypertension)   . DM (diabetes mellitus)   . Occlusion and stenosis of carotid artery without mention of cerebral infarction   . Coronary atherosclerosis of artery bypass graft   . H/O: gout   . Diverticulitis   . Colon polyps   . Blood transfusion   . GERD (gastroesophageal reflux disease)   . Hyperlipidemia   . Renal insufficiency   . Bronchitis   . Stricture and stenosis of esophagus 11/06/2011    egd with esoph stricture dilation 11/2011.  [name]  . Esophageal reflux 11/06/2011    ROS: Negative except as per HPI  BP 136/74  Pulse 62  Ht 5\' 10"  (1.778 m)  Wt  89.812 kg (198 lb)  BMI 28.41 kg/m2  PHYSICAL EXAM: Pt is alert and oriented, pleasant elderly male in NAD HEENT: normal Neck: JVP - normal, carotids 2+= without bruits Lungs: CTA bilaterally CV: RRR without murmur or gallop Abd: soft, NT, Positive BS, no hepatomegaly Ext: Trace pretibial edema bilaterally, distal pulses intact and equal Skin: warm/dry no rash  EKG:  Normal sinus rhythm with first degree AV block, age-indeterminate septal infarct, nonspecific ST and T wave abnormality.  ASSESSMENT AND PLAN: 1. Coronary atherosclerosis status post CABG and multiple PCI procedures. The patient is stable without anginal symptoms. Will continue his current medical program without changes. I agree with the strategy of long-term dual antiplatelet therapy considering his extensive revascularization with surgery in both first and second generation drug-eluting stents.  2. Edema. No clinical  signs of congestive heart failure at present. I wrote him a prescription for Lasix 40 mg to be used only as needed. We also discussed leg elevation.  3. Essential hypertension. Blood pressure is well controlled on a combination of amlodipine, hydrochlorothiazide, and metoprolol.  4. Hyperlipidemia. The patient's lipids are followed by Dr. Lysbeth Galas. He is on atorvastatin 20 mg daily.  I will see him back in 6 months.   Tonny Bollman 06/27/2013 1:24 PM

## 2013-06-27 NOTE — Patient Instructions (Addendum)
Your physician has recommended you make the following change in your medication: START Furosemide 40mg  by mouth daily to be used as needed for swelling  Your physician wants you to follow-up in: 6 MONTHS with Dr Excell Seltzer.  You will receive a reminder letter in the mail two months in advance. If you don't receive a letter, please call our office to schedule the follow-up appointment.

## 2013-10-20 ENCOUNTER — Other Ambulatory Visit: Payer: Self-pay | Admitting: Cardiovascular Disease

## 2013-10-21 ENCOUNTER — Other Ambulatory Visit: Payer: Self-pay | Admitting: Cardiovascular Disease

## 2013-12-16 ENCOUNTER — Ambulatory Visit (INDEPENDENT_AMBULATORY_CARE_PROVIDER_SITE_OTHER): Payer: Medicare Other | Admitting: Gastroenterology

## 2013-12-16 ENCOUNTER — Telehealth: Payer: Self-pay | Admitting: *Deleted

## 2013-12-16 ENCOUNTER — Encounter: Payer: Self-pay | Admitting: Gastroenterology

## 2013-12-16 VITALS — BP 132/80 | HR 84 | Ht 70.0 in | Wt 194.2 lb

## 2013-12-16 DIAGNOSIS — R1310 Dysphagia, unspecified: Secondary | ICD-10-CM

## 2013-12-16 NOTE — Patient Instructions (Signed)
Your procedure [facility] Separate instructions given

## 2013-12-16 NOTE — Telephone Encounter (Signed)
  12/16/2013   RE: Brendan Arroyo DOB: [birth year] MRN: [id number]   Dear Dr Excell Seltzerooper,   We have scheduled the above patient for an endoscopic procedure. Our records show that he is on anticoagulation therapy.   Please advise as to how long the patient may come off his therapy of Plavix prior to the procedure, which is scheduled for 01/07/2014 .  Please fax back/ or route the completed form to Kyrian Stage at [phone].   Sincerely,    Merri Rayobin Maicey Barrientez

## 2013-12-16 NOTE — Progress Notes (Signed)
  _                                                                                                                History of Present Illness: 80-year-old white male with history of hypertension, diabetes, esophageal stricture, coronary artery disease, on Plavix, referred for evaluation of dysphagia.  He has developed progressive dysphagia to solids.  He's had several food impactions recently which he has resolved by coughing up food.  He denies odynophagia.  Last dilatation for a peptic stricture was 2 years ago.  He had emergency endoscopy subsequent to that for food impaction.    Past Medical History  Diagnosis Date  . Dyslipidemia   . HTN (hypertension)   . DM (diabetes mellitus)   . Occlusion and stenosis of carotid artery without mention of cerebral infarction   . Coronary atherosclerosis of artery bypass graft   . H/O: gout   . Diverticulitis   . Colon polyps   . Blood transfusion   . GERD (gastroesophageal reflux disease)   . Hyperlipidemia   . Renal insufficiency   . Bronchitis   . Stricture and stenosis of esophagus 11/06/2011    egd with esoph stricture dilation 11/2011.  robert Kaplan MD  . Esophageal reflux 11/06/2011   Past Surgical History  Procedure Laterality Date  . Cabg  1993  . Coronary stent placement  July 2005, 2008    taxus stent of the distal right coronary artery   . Elbow surgery      right  . Tonsillectomy    . Back surgery    . Esophagogastroduodenoscopy  07/31/2012    Procedure: ESOPHAGOGASTRODUODENOSCOPY (EGD);  Surgeon: Malcolm T Stark, MD,FACG;  Location: [facility] ENDOSCOPY;  Service: Endoscopy;  Laterality: N/A;  pt has food impaction, takes plavix, so no plans to dilate.   family history includes Diabetes in his brother and mother; Heart disease in his maternal grandfather; Ovarian cancer in his mother. There is no history of Colon cancer, Esophageal cancer, or Stomach cancer. Current Outpatient Prescriptions  Medication Sig Dispense  Refill  . amLODipine (NORVASC) 5 MG tablet Take 2.5-5 mg by mouth 2 (two) times daily. Take 5mg in the am and 2.5mg in the pm      . atorvastatin (LIPITOR) 20 MG tablet Take 20 mg by mouth daily.      . budesonide-formoterol (SYMBICORT) 160-4.5 MCG/ACT inhaler Inhale 2 puffs into the lungs 2 (two) times daily.      . clopidogrel (PLAVIX) 75 MG tablet Take 75 mg by mouth daily.        . furosemide (LASIX) 40 MG tablet TAKE ONE TABLET BY MOUTH ONCE DAILY AS NEEDED  30 tablet  0  . gabapentin (NEURONTIN) 300 MG capsule Take 300 mg by mouth daily.      . guaifenesin (HUMIBID E) 400 MG TABS tablet Take 400 mg by mouth daily.      . hydrochlorothiazide 25 MG tablet Take 25 mg by mouth daily.        .   metFORMIN (GLUCOPHAGE) 500 MG tablet Take 500 mg by mouth 2 (two) times daily.       . metoprolol (LOPRESSOR) 50 MG tablet Take 50 mg by mouth 2 (two) times daily.        . Multiple Vitamins-Minerals (CVS SPECTRAVITE SENIOR) TABS Take 1 tablet by mouth daily.        . nitroGLYCERIN (NITROSTAT) 0.4 MG SL tablet Place 0.4 mg under the tongue every 5 (five) minutes as needed. For chest pain      . OMEPRAZOLE PO Take by mouth as needed.      . OXYGEN-HELIUM IN Inhale into the lungs as directed.       No current facility-administered medications for this visit.   Allergies as of 12/16/2013  . (No Known Allergies)    reports that he has quit smoking. He has never used smokeless tobacco. He reports that he drinks about 2.4 ounces of alcohol per week. He reports that he does not use illicit drugs.     Review of Systems: Patient has COPD and baseline dyspnea on exertion.  Pertinent positive and negative review of systems were noted in the above HPI section. All other review of systems were otherwise negative.  Vital signs were reviewed in today's medical record Physical Exam: General: Well developed , well nourished, no acute distress Skin: anicteric Head: Normocephalic and atraumatic Eyes:  sclerae  anicteric, EOMI Ears: Normal auditory acuity Mouth: No deformity or lesions Neck: Supple, no masses or thyromegaly Lungs: Clear throughout to auscultation Heart: Regular rate and rhythm; no murmurs, rubs or bruits Abdomen: Soft, non tender and non distended. No masses, hepatosplenomegaly or hernias noted. Normal Bowel sounds Rectal:deferred Musculoskeletal: Symmetrical with no gross deformities  Skin: No lesions on visible extremities Pulses:  Normal pulses noted Extremities: No clubbing, cyanosis, edema or deformities noted Neurological: Alert oriented x 4, grossly nonfocal Cervical Nodes:  No significant cervical adenopathy Inguinal Nodes: No significant inguinal adenopathy Psychological:  Alert and cooperative. Normal mood and affect  See Assessment and Plan under Problem List       

## 2013-12-16 NOTE — Assessment & Plan Note (Signed)
Dysphagia is very likely do to a recurrent esophageal stricture.  Recommendations #1 upper endoscopy with balloon dilation.  I will check with the patient's PCP to determine whether we can hold Plavix

## 2013-12-17 NOTE — Telephone Encounter (Signed)
OK TO HOLD PLAVIX 5 DAYS BEFORE PER DR COOPER   CALLED PT TO INFORM

## 2013-12-17 NOTE — Telephone Encounter (Signed)
He may hold Plavix 5 days prior to endoscopy and resume when safe after the procedure as determined by the gastroenterologist.

## 2014-01-01 ENCOUNTER — Other Ambulatory Visit: Payer: Self-pay | Admitting: Cardiovascular Disease

## 2014-01-02 ENCOUNTER — Other Ambulatory Visit: Payer: Self-pay | Admitting: Cardiovascular Disease

## 2014-01-07 ENCOUNTER — Encounter (HOSPITAL_COMMUNITY)
Admission: RE | Disposition: A | Discharge status: Home / Self Care | Payer: Self-pay | Source: Ambulatory Visit | Attending: Gastroenterology

## 2014-01-07 ENCOUNTER — Ambulatory Visit (HOSPITAL_COMMUNITY)
Admission: RE | Admit: 2014-01-07 | Discharge: 2014-01-07 | Disposition: A | Discharge status: Home / Self Care | Payer: Medicare Other | Source: Ambulatory Visit | Attending: Gastroenterology | Admitting: Gastroenterology

## 2014-01-07 ENCOUNTER — Encounter (HOSPITAL_COMMUNITY): Payer: Self-pay | Admitting: *Deleted

## 2014-01-07 DIAGNOSIS — Z87891 Personal history of nicotine dependence: Secondary | ICD-10-CM | POA: Insufficient documentation

## 2014-01-07 DIAGNOSIS — Z79899 Other long term (current) drug therapy: Secondary | ICD-10-CM | POA: Insufficient documentation

## 2014-01-07 DIAGNOSIS — M109 Gout, unspecified: Secondary | ICD-10-CM | POA: Insufficient documentation

## 2014-01-07 DIAGNOSIS — I10 Essential (primary) hypertension: Secondary | ICD-10-CM | POA: Insufficient documentation

## 2014-01-07 DIAGNOSIS — K222 Esophageal obstruction: Secondary | ICD-10-CM | POA: Insufficient documentation

## 2014-01-07 DIAGNOSIS — Z951 Presence of aortocoronary bypass graft: Secondary | ICD-10-CM | POA: Insufficient documentation

## 2014-01-07 DIAGNOSIS — E119 Type 2 diabetes mellitus without complications: Secondary | ICD-10-CM | POA: Insufficient documentation

## 2014-01-07 DIAGNOSIS — E785 Hyperlipidemia, unspecified: Secondary | ICD-10-CM | POA: Insufficient documentation

## 2014-01-07 DIAGNOSIS — K219 Gastro-esophageal reflux disease without esophagitis: Secondary | ICD-10-CM | POA: Insufficient documentation

## 2014-01-07 DIAGNOSIS — R1310 Dysphagia, unspecified: Secondary | ICD-10-CM | POA: Insufficient documentation

## 2014-01-07 HISTORY — PX: ESOPHAGOGASTRODUODENOSCOPY: SHX5428

## 2014-01-07 HISTORY — PX: BALLOON DILATION: SHX5330

## 2014-01-07 LAB — GLUCOSE, CAPILLARY: GLUCOSE-CAPILLARY: 173 mg/dL — AB (ref 70–99)

## 2014-01-07 SURGERY — EGD (ESOPHAGOGASTRODUODENOSCOPY)
Anesthesia: Moderate Sedation

## 2014-01-07 MED ORDER — BUTAMBEN-TETRACAINE-BENZOCAINE 2-2-14 % EX AERO
INHALATION_SPRAY | CUTANEOUS | Status: DC | PRN
  Administered 2014-01-07: 2 via TOPICAL

## 2014-01-07 MED ORDER — FENTANYL CITRATE 0.05 MG/ML IJ SOLN
INTRAMUSCULAR | Status: AC
  Filled 2014-01-07: qty 2

## 2014-01-07 MED ORDER — FENTANYL CITRATE 0.05 MG/ML IJ SOLN
INTRAMUSCULAR | Status: DC | PRN
  Administered 2014-01-07: 25 ug via INTRAVENOUS

## 2014-01-07 MED ORDER — SODIUM CHLORIDE 0.9 % IV SOLN
INTRAVENOUS | Status: DC
  Administered 2014-01-07: 500 mL via INTRAVENOUS

## 2014-01-07 MED ORDER — MIDAZOLAM HCL 10 MG/2ML IJ SOLN
INTRAMUSCULAR | Status: AC
  Filled 2014-01-07: qty 2

## 2014-01-07 MED ORDER — MIDAZOLAM HCL 10 MG/2ML IJ SOLN
INTRAMUSCULAR | Status: DC | PRN
  Administered 2014-01-07 (×3): 1 mg via INTRAVENOUS

## 2014-01-07 NOTE — Interval H&P Note (Signed)
History and Physical Interval Note:  01/07/2014 11:35 AM  Brendan Arroyo  has presented today for surgery, with the diagnosis of Dysphagia [787.20]  The various methods of treatment have been discussed with the patient and family. After consideration of risks, benefits and other options for treatment, the patient has consented to  Procedure(s): ESOPHAGOGASTRODUODENOSCOPY (EGD) (N/A) BALLOON DILATION (N/A) as a surgical intervention .  The patient's history has been reviewed, patient examined, no change in status, stable for surgery.  I have reviewed the patient's chart and labs.  Questions were answered to the patient's satisfaction.    The recent H&P (dated *12/16/13**) was reviewed, the patient was examined and there is no change in the patients condition since that H&P was completed.   Louis MeckelRobert D Laquasha Groome  01/07/2014, 11:35 AM    Louis Meckelobert D Alyx Mcguirk

## 2014-01-07 NOTE — H&P (View-Only) (Signed)
_                                                                                                                History of Present Illness: 80 year old white male with history of hypertension, diabetes, esophageal stricture, coronary artery disease, on Plavix, referred for evaluation of dysphagia.  He has developed progressive dysphagia to solids.  He's had several food impactions recently which he has resolved by coughing up food.  He denies odynophagia.  Last dilatation for a peptic stricture was 2 years ago.  He had emergency endoscopy subsequent to that for food impaction.    Past Medical History  Diagnosis Date  . Dyslipidemia   . HTN (hypertension)   . DM (diabetes mellitus)   . Occlusion and stenosis of carotid artery without mention of cerebral infarction   . Coronary atherosclerosis of artery bypass graft   . H/O: gout   . Diverticulitis   . Colon polyps   . Blood transfusion   . GERD (gastroesophageal reflux disease)   . Hyperlipidemia   . Renal insufficiency   . Bronchitis   . Stricture and stenosis of esophagus 11/06/2011    egd with esoph stricture dilation 11/2011.  [name]  . Esophageal reflux 11/06/2011   Past Surgical History  Procedure Laterality Date  . Cabg  1993  . Coronary stent placement  July 2005, 2008    taxus stent of the distal right coronary artery   . Elbow surgery      right  . Tonsillectomy    . Back surgery    . Esophagogastroduodenoscopy  07/31/2012    Procedure: ESOPHAGOGASTRODUODENOSCOPY (EGD);  Surgeon: Meryl Dare, MD,FACG;  Location: [facility] ENDOSCOPY;  Service: Endoscopy;  Laterality: N/A;  pt has food impaction, takes plavix, so no plans to dilate.   family history includes Diabetes in his brother and mother; Heart disease in his maternal grandfather; Ovarian cancer in his mother. There is no history of Colon cancer, Esophageal cancer, or Stomach cancer. Current Outpatient Prescriptions  Medication Sig Dispense  Refill  . amLODipine (NORVASC) 5 MG tablet Take 2.5-5 mg by mouth 2 (two) times daily. Take 5mg  in the am and 2.5mg  in the pm      . atorvastatin (LIPITOR) 20 MG tablet Take 20 mg by mouth daily.      . budesonide-formoterol (SYMBICORT) 160-4.5 MCG/ACT inhaler Inhale 2 puffs into the lungs 2 (two) times daily.      . clopidogrel (PLAVIX) 75 MG tablet Take 75 mg by mouth daily.        . furosemide (LASIX) 40 MG tablet TAKE ONE TABLET BY MOUTH ONCE DAILY AS NEEDED  30 tablet  0  . gabapentin (NEURONTIN) 300 MG capsule Take 300 mg by mouth daily.      Marland Kitchen guaifenesin (HUMIBID E) 400 MG TABS tablet Take 400 mg by mouth daily.      . hydrochlorothiazide 25 MG tablet Take 25 mg by mouth daily.        Marland Kitchen  metFORMIN (GLUCOPHAGE) 500 MG tablet Take 500 mg by mouth 2 (two) times daily.       . metoprolol (LOPRESSOR) 50 MG tablet Take 50 mg by mouth 2 (two) times daily.        . Multiple Vitamins-Minerals (CVS SPECTRAVITE SENIOR) TABS Take 1 tablet by mouth daily.        . nitroGLYCERIN (NITROSTAT) 0.4 MG SL tablet Place 0.4 mg under the tongue every 5 (five) minutes as needed. For chest pain      . OMEPRAZOLE PO Take by mouth as needed.      Docia Barrier. OXYGEN-HELIUM IN Inhale into the lungs as directed.       No current facility-administered medications for this visit.   Allergies as of 12/16/2013  . (No Known Allergies)    reports that he has quit smoking. He has never used smokeless tobacco. He reports that he drinks about 2.4 ounces of alcohol per week. He reports that he does not use illicit drugs.     Review of Systems: Patient has COPD and baseline dyspnea on exertion.  Pertinent positive and negative review of systems were noted in the above HPI section. All other review of systems were otherwise negative.  Vital signs were reviewed in today's medical record Physical Exam: General: Well developed , well nourished, no acute distress Skin: anicteric Head: Normocephalic and atraumatic Eyes:  sclerae  anicteric, EOMI Ears: Normal auditory acuity Mouth: No deformity or lesions Neck: Supple, no masses or thyromegaly Lungs: Clear throughout to auscultation Heart: Regular rate and rhythm; no murmurs, rubs or bruits Abdomen: Soft, non tender and non distended. No masses, hepatosplenomegaly or hernias noted. Normal Bowel sounds Rectal:deferred Musculoskeletal: Symmetrical with no gross deformities  Skin: No lesions on visible extremities Pulses:  Normal pulses noted Extremities: No clubbing, cyanosis, edema or deformities noted Neurological: Alert oriented x 4, grossly nonfocal Cervical Nodes:  No significant cervical adenopathy Inguinal Nodes: No significant inguinal adenopathy Psychological:  Alert and cooperative. Normal mood and affect  See Assessment and Plan under Problem List

## 2014-01-07 NOTE — Discharge Instructions (Signed)
Resume Plavix in 3 days   Esophagogastroduodenoscopy Care After Refer to this sheet in the next few weeks. These instructions provide you with information on caring for yourself after your procedure. Your caregiver may also give you more specific instructions. Your treatment has been planned according to current medical practices, but problems sometimes occur. Call your caregiver if you have any problems or questions after your procedure.  HOME CARE INSTRUCTIONS  Do not eat or drink anything until the numbing medicine (local anesthetic) has worn off and your gag reflex has returned. You will know that the local anesthetic has worn off when you can swallow comfortably.  Do not drive for 12 hours after the procedure or as directed by your caregiver.  Only take medicines as directed by your caregiver. SEEK MEDICAL CARE IF:   You cannot stop coughing.  You are not urinating at all or less than usual. SEEK IMMEDIATE MEDICAL CARE IF:  You have difficulty swallowing.  You cannot eat or drink.  You have worsening throat or chest pain.  You have dizziness, lightheadedness, or you faint.  You have nausea or vomiting.  You have chills.  You have a fever.  You have severe abdominal pain.  You have black, tarry, or bloody stools. Document Released: 09/04/2012 Document Reviewed: 09/04/2012 [facility] Patient Information 2014 [place], [place].

## 2014-01-07 NOTE — Op Note (Signed)
[facility] [address] [place], [id number]   ENDOSCOPY PROCEDURE REPORT  PATIENT: Brendan AxeJoyce, Whit W.  MR#: [id number] BIRTHDATE: 1929-02-05 , 84  yrs. old GENDER: Male ENDOSCOPIST: Louis Meckelobert D Jenie Parish, MD ASSISTANT:   Nilsa NuttingFelicia Akande, Endo Technician Priscella MannAutumn Goldsmith, technician Dwain SarnaPatricia Ford, RN CGRN REFERRED GM:[id number]:Leonard Lysbeth GalasNyland, M.D. PROCEDURE DATE:  01/07/2014 PROCEDURE:   EGD with balloon dilatation ASA CLASS:   Class III INDICATIONS:dysphagia. MEDICATIONS: These medications were titrated to patient response per physician's verbal order, Versed 3 mg IV, and Fentanyl 25 mcg IV  TOPICAL ANESTHETIC:   Cetacaine Spray  DESCRIPTION OF PROCEDURE:   After the risks benefits and alternatives of the procedure were thoroughly explained, informed consent was obtained.  The     endoscope was introduced through the mouth  and advanced to the third portion of the duodenum ,      The instrument was slowly withdrawn as the mucosa was carefully examined.    A moderate stricture was seen at the GE junction.  The 9 mm gastroscope passed with minimal resistance.   The remainder of the upper endoscopy exam was otherwise normal.     Dilation was then performed at the gastroesphageal junction  Dilator:Balloon Size:15-16.855mm  Reststance:moderate Heme:yes  COMPLICATIONS: There were no complications. ENDOSCOPIC IMPRESSION: 1.   Peptic esophageal stricture-status post balloon dilation  RECOMMENDATIONS: 1.  resume Plavix in 3 days 2.  followup endoscopy with balloon dilation in approximately 3 weeks  eSigned:  Louis Meckelobert D Enedina Pair, MD 01/07/2014 12:05 PM  CC:

## 2014-01-08 ENCOUNTER — Encounter (HOSPITAL_COMMUNITY): Payer: Self-pay | Admitting: Gastroenterology

## 2014-01-14 ENCOUNTER — Telehealth: Payer: Self-pay

## 2014-01-14 ENCOUNTER — Other Ambulatory Visit: Payer: Self-pay

## 2014-01-14 DIAGNOSIS — R1319 Other dysphagia: Secondary | ICD-10-CM

## 2014-01-14 NOTE — Telephone Encounter (Signed)
[facility]@11 :15am. Pt to arrive there at 10:00am. Pt to be NPO after midnight, hold Plavix for 5 days prior to EGD. Instructions mailed to pt.

## 2014-01-14 NOTE — Telephone Encounter (Signed)
Message copied by Chrystie NoseHUNT, LINDA R on Wed Jan 14, 2014  3:25 PM ------      Message from: Melvia HeapsKAPLAN, ROBERT D      Created: Wed Jan 07, 2014 12:07 PM       Please schedule followup EGD with balloon dilation in approximately 3 weeks.  Patient will need to hold Plavix ------

## 2014-01-20 ENCOUNTER — Other Ambulatory Visit: Payer: Self-pay | Admitting: *Deleted

## 2014-01-20 MED ORDER — FUROSEMIDE 40 MG PO TABS: ORAL_TABLET | ORAL | Status: DC

## 2014-01-26 ENCOUNTER — Encounter: Payer: Self-pay | Admitting: Cardiovascular Disease

## 2014-01-26 ENCOUNTER — Ambulatory Visit (INDEPENDENT_AMBULATORY_CARE_PROVIDER_SITE_OTHER): Payer: Medicare Other | Admitting: Cardiovascular Disease

## 2014-01-26 VITALS — BP 132/72 | HR 76 | Ht 70.0 in | Wt 188.8 lb

## 2014-01-26 DIAGNOSIS — I25810 Atherosclerosis of coronary artery bypass graft(s) without angina pectoris: Secondary | ICD-10-CM

## 2014-01-26 DIAGNOSIS — I10 Essential (primary) hypertension: Secondary | ICD-10-CM

## 2014-01-26 DIAGNOSIS — R079 Chest pain, unspecified: Secondary | ICD-10-CM

## 2014-01-26 MED ORDER — NITROGLYCERIN 0.4 MG SL SUBL: 0.4000 mg | SUBLINGUAL_TABLET | SUBLINGUAL | Status: DC | PRN

## 2014-01-26 NOTE — Progress Notes (Signed)
HPI:  80 year old gentleman presenting for followup evaluation. The patient has had remote angioplasty about 20 years ago. He had CABG in 1993. His last heart catheterization in 2010 demonstrated patency of the LIMA to LAD. He has a Promus DES in the left circumflex and a Taxus DES in the distal right coronary artery at the anastomotic site of the vein graft. He's also been followed for bilateral carotid stenosis. His last duplex was November 2013 demonstrating 40-59% stenosis bilaterally.  The patient has developed a pressure-like discomfort in his left upper chest and shoulder. This primarily occurs after eating. He has also been associated with exertion after eating. He has some similarities to his previous angina but he finds it difficult to differentiate from sx's of GERD. He has chronic shortness of breath and wears oxygen at night time as well as with exertion. This is unchanged. He has mild leg swelling but this is improved with Lasix as needed. He denies orthopnea, PND, or resting chest pain.   Outpatient Encounter Prescriptions as of 01/26/2014  Medication Sig  . amLODipine (NORVASC) 5 MG tablet Take 2.5-5 mg by mouth 2 (two) times daily. Take 5mg  in the am and 2.5mg  in the pm  . atorvastatin (LIPITOR) 20 MG tablet Take 20 mg by mouth daily.  . budesonide-formoterol (SYMBICORT) 160-4.5 MCG/ACT inhaler Inhale 2 puffs into the lungs 2 (two) times daily.  . clopidogrel (PLAVIX) 75 MG tablet Take 75 mg by mouth daily.    . furosemide (LASIX) 40 MG tablet TAKE ONE TABLET BY MOUTH ONCE DAILY AS NEEDED  . gabapentin (NEURONTIN) 300 MG capsule Take 300 mg by mouth daily.  Marland Kitchen. guaifenesin (HUMIBID E) 400 MG TABS tablet Take 400 mg by mouth daily.  . hydrochlorothiazide 25 MG tablet Take 25 mg by mouth daily.    . metFORMIN (GLUCOPHAGE) 500 MG tablet Take 500 mg by mouth 2 (two) times daily.   . metoprolol (LOPRESSOR) 50 MG tablet Take 50 mg by mouth 2 (two) times daily.    . Multiple  Vitamins-Minerals (CVS SPECTRAVITE SENIOR) TABS Take 1 tablet by mouth daily.    . nitroGLYCERIN (NITROSTAT) 0.4 MG SL tablet Place 0.4 mg under the tongue every 5 (five) minutes as needed. For chest pain  . OMEPRAZOLE PO Take by mouth as needed.  Brendan Arroyo. OXYGEN-HELIUM IN Inhale into the lungs as directed.    No Known Allergies  Past Medical History  Diagnosis Date  . Dyslipidemia   . HTN (hypertension)   . DM (diabetes mellitus)   . Occlusion and stenosis of carotid artery without mention of cerebral infarction   . Coronary atherosclerosis of artery bypass graft   . H/O: gout   . Diverticulitis   . Colon polyps   . Blood transfusion   . GERD (gastroesophageal reflux disease)   . Hyperlipidemia   . Renal insufficiency   . Bronchitis   . Stricture and stenosis of esophagus 11/06/2011    egd with esoph stricture dilation 11/2011.  Melvia Heapsrobert Kaplan MD  . Esophageal reflux 11/06/2011    ROS: Negative except as per HPI  BP 132/72  Pulse 76  Ht 5\' 10"  (1.778 m)  Wt 188 lb 12.8 oz (85.639 kg)  BMI 27.09 kg/m2  PHYSICAL EXAM: Pt is alert and oriented, NAD HEENT: normal Neck: JVP - normal, carotids 2+= without bruits Lungs: CTA bilaterally CV: RRR without murmur or gallop Abd: soft, NT, Positive BS, no hepatomegaly Ext: Trace ankle edema bilaterally, distal pulses intact and equal  Skin: warm/dry no rash  EKG:  Normal sinus rhythm 79 beats per minute, ST and T wave abnormality consider inferior and anterolateral ischemia. T-wave changes a little more prominent than those of previous tracing from 06/27/2013 peer  ASSESSMENT AND PLAN: 1. Coronary atherosclerosis status post CABG and multiple PCI procedures. He remains on long-term dual antiplatelet therapy. His symptoms are concerning for recurrence of angina. We discussed consideration of noninvasive testing versus definitive evaluation with cardiac catheterization. Symptoms of been progressive for a few months, but he's had about 2 weeks  without symptoms. I think it would be reasonable to start with a Lexiscan Myoview stress test and the patient favors this approach. Will determine whether he needs cardiac catheterization based on progression of symptoms and results of his stress test. His medical regimen will be continued without changes.  2. Edema. No clinical signs of congestive heart failure at present. This is improved with when necessary use of Lasix  3. Essential hypertension. Blood pressure is well controlled on a combination of amlodipine, hydrochlorothiazide, and metoprolol.   4. Hyperlipidemia. The patient's lipids are followed by Dr. Lysbeth GalasNyland. He is on atorvastatin 20 mg daily.   5. Carotid stenosis - 40-59% stenosis noted on 2013 study.  I will see him back in 6 months unless significant ischemia on his Myoview scan or progressive symptoms.   Brendan Arroyo 01/26/2014 3:51 PM

## 2014-01-26 NOTE — Patient Instructions (Signed)
Your physician has requested that you have a lexiscan myoview. For further information please visit [email or web address]. Please follow instruction sheet, as given.  Your physician recommends that you continue on your current medications as directed. Please refer to the Current Medication list given to you today.  Your physician wants you to follow-up in: 6 MONTHS with Dr Excell Seltzerooper.  You will receive a reminder letter in the mail two months in advance. If you don't receive a letter, please call our office to schedule the follow-up appointment.

## 2014-02-05 ENCOUNTER — Encounter: Payer: Self-pay | Admitting: Cardiology

## 2014-02-09 ENCOUNTER — Ambulatory Visit (HOSPITAL_COMMUNITY): Payer: Medicare Other | Attending: Cardiovascular Disease | Admitting: Radiology

## 2014-02-09 VITALS — BP 144/74 | Ht 70.0 in | Wt 195.0 lb

## 2014-02-09 DIAGNOSIS — I10 Essential (primary) hypertension: Secondary | ICD-10-CM | POA: Insufficient documentation

## 2014-02-09 DIAGNOSIS — I25810 Atherosclerosis of coronary artery bypass graft(s) without angina pectoris: Secondary | ICD-10-CM

## 2014-02-09 DIAGNOSIS — I2510 Atherosclerotic heart disease of native coronary artery without angina pectoris: Secondary | ICD-10-CM | POA: Insufficient documentation

## 2014-02-09 DIAGNOSIS — R0602 Shortness of breath: Secondary | ICD-10-CM

## 2014-02-09 DIAGNOSIS — R079 Chest pain, unspecified: Secondary | ICD-10-CM | POA: Insufficient documentation

## 2014-02-09 MED ORDER — TECHNETIUM TC 99M SESTAMIBI GENERIC - CARDIOLITE
10.0000 | Freq: Once | INTRAVENOUS | Status: AC | PRN
  Administered 2014-02-09: 10 via INTRAVENOUS

## 2014-02-09 MED ORDER — TECHNETIUM TC 99M SESTAMIBI GENERIC - CARDIOLITE
30.0000 | Freq: Once | INTRAVENOUS | Status: AC | PRN
  Administered 2014-02-09: 30 via INTRAVENOUS

## 2014-02-09 MED ORDER — REGADENOSON 0.4 MG/5ML IV SOLN
0.4000 mg | Freq: Once | INTRAVENOUS | Status: AC
  Administered 2014-02-09: 0.4 mg via INTRAVENOUS

## 2014-02-09 NOTE — Progress Notes (Signed)
  [facility] SITE [facility] NUCLEAR MED [address] [place] [place], [place] [address] [phone]    Cardiology Nuclear Med Study  Elder NegusFranklin W [name] is a 80 y.o. male     MRN : [id number]     DOB: Jul 24, 1929  Procedure Date: 02/09/2014  Nuclear Med Background Indication for Stress Test:  Evaluation for Ischemia, Graft Patency, Stent Patency, PTCA Patency and Abnormal EKG:T wave changes History:  COPD and '00 MPI: EF: 65% NL, CAD-CATH-CABG-STENT-PTCA Cardiac Risk Factors: Carotid Disease, History of Smoking, Hypertension, Lipids and NIDDM  Symptoms:  Chest Pain and SOB   Nuclear Pre-Procedure Caffeine/Decaff Intake:  None NPO After: 8:00am   Lungs:  clear O2 Sat: 96% on room air. IV 0.9% NS with Angio Cath:  22g  IV Site: R Hand  IV Started by:  Cathlyn Parsonsynthia Hasspacher, RN  Chest Size (in):  44 Cup Size: n/a  Height: 5\' 10"  (1.778 m)  Weight:  195 lb (88.451 kg)  BMI:  Body mass index is 27.98 kg/(m^2). Tech Comments:  Metoprolol taken at 0800    Nuclear Med Study 1 or 2 day study: 1 day  Stress Test Type:  Eugenie BirksLexiscan  Reading MD: n/a  Order Authorizing Provider:  Casimiro NeedleMichael Cooper,MD  Resting Radionuclide: Technetium 9711m Sestamibi  Resting Radionuclide Dose: 11.0 mCi   Stress Radionuclide:  Technetium 7111m Sestamibi  Stress Radionuclide Dose: 33.0 mCi           Stress Protocol Rest HR: 56 Stress HR: 75  Rest BP: 144/74 Stress BP: 146/56  Exercise Time (min): n/a METS: n/a   Predicted Max HR: 138 bpm % Max HR: 54.35 bpm Rate Pressure Product: [id number]   Dose of Adenosine (mg):  n/a Dose of Lexiscan: 0.4 mg  Dose of Atropine (mg): n/a Dose of Dobutamine: n/a mcg/kg/min (at max HR)  Stress Test Technologist: Milana NaSabrina Williams, EMT-P  Nuclear Technologist:  Domenic PoliteStephen Carbone, CNMT     Rest Procedure:  Myocardial perfusion imaging was performed at rest 45 minutes following the intravenous administration of Technetium 5611m Sestamibi. Rest ECG: NSR with non-specific ST-T wave  changes  Stress Procedure:  The patient received IV Lexiscan 0.4 mg over 15-seconds.  Technetium 5711m Sestamibi injected at 30-seconds.  This patient had sob and abdominal pain with the Lexiscan injection.Quantitative spect images were obtained after a 45 minute delay. Stress ECG: No significant change from baseline ECG  QPS Raw Data Images:  Normal; no motion artifact; normal heart/lung ratio. Stress Images:  Normal homogeneous uptake in all areas of the myocardium. Rest Images:  Normal homogeneous uptake in all areas of the myocardium. Subtraction (SDS):  No evidence of ischemia. Transient Ischemic Dilatation (Normal <1.22):  1.10 Lung/Heart Ratio (Normal <0.45):  0.35  Quantitative Gated Spect Images QGS EDV:  62 ml QGS ESV:  18 ml  Impression Exercise Capacity:  Lexiscan with no exercise. BP Response:  Normal blood pressure response. Clinical Symptoms:  No chest pain. ECG Impression:  No change from baseline pattern of widespread T wave inversion. Comparison with Prior Nuclear Study: No images to compare  Overall Impression:  Low risk stress nuclear study. No evidence of ischemia or scar. No new EKG changes with stress. Normal LV systolic function.  LV Ejection Fraction: 70%.  LV Wall Motion:  Normal Wall Motion   Cassell Clementhomas Madelon Welsch MD

## 2014-02-20 ENCOUNTER — Encounter (HOSPITAL_COMMUNITY): Payer: Self-pay | Admitting: Pharmacy Technician

## 2014-02-20 ENCOUNTER — Encounter (HOSPITAL_COMMUNITY): Payer: Self-pay | Admitting: *Deleted

## 2014-03-09 ENCOUNTER — Ambulatory Visit (HOSPITAL_COMMUNITY): Payer: Medicare Other | Admitting: Anesthesiology

## 2014-03-09 ENCOUNTER — Encounter (HOSPITAL_COMMUNITY): Payer: Self-pay | Admitting: *Deleted

## 2014-03-09 ENCOUNTER — Encounter (HOSPITAL_COMMUNITY)
Admission: RE | Disposition: A | Discharge status: Home / Self Care | Payer: Self-pay | Source: Ambulatory Visit | Attending: Gastroenterology

## 2014-03-09 ENCOUNTER — Encounter (HOSPITAL_COMMUNITY): Payer: Medicare Other | Admitting: Anesthesiology

## 2014-03-09 ENCOUNTER — Ambulatory Visit (HOSPITAL_COMMUNITY)
Admission: RE | Admit: 2014-03-09 | Discharge: 2014-03-09 | Disposition: A | Discharge status: Home / Self Care | Payer: Medicare Other | Source: Ambulatory Visit | Attending: Gastroenterology | Admitting: Gastroenterology

## 2014-03-09 DIAGNOSIS — IMO0002 Reserved for concepts with insufficient information to code with codable children: Secondary | ICD-10-CM | POA: Insufficient documentation

## 2014-03-09 DIAGNOSIS — N183 Chronic kidney disease, stage 3 unspecified: Secondary | ICD-10-CM | POA: Insufficient documentation

## 2014-03-09 DIAGNOSIS — Z79899 Other long term (current) drug therapy: Secondary | ICD-10-CM | POA: Insufficient documentation

## 2014-03-09 DIAGNOSIS — R1319 Other dysphagia: Secondary | ICD-10-CM

## 2014-03-09 DIAGNOSIS — K222 Esophageal obstruction: Secondary | ICD-10-CM | POA: Insufficient documentation

## 2014-03-09 DIAGNOSIS — Z87891 Personal history of nicotine dependence: Secondary | ICD-10-CM | POA: Insufficient documentation

## 2014-03-09 DIAGNOSIS — E119 Type 2 diabetes mellitus without complications: Secondary | ICD-10-CM | POA: Insufficient documentation

## 2014-03-09 DIAGNOSIS — K219 Gastro-esophageal reflux disease without esophagitis: Secondary | ICD-10-CM | POA: Insufficient documentation

## 2014-03-09 DIAGNOSIS — I2510 Atherosclerotic heart disease of native coronary artery without angina pectoris: Secondary | ICD-10-CM | POA: Insufficient documentation

## 2014-03-09 DIAGNOSIS — Z9861 Coronary angioplasty status: Secondary | ICD-10-CM | POA: Insufficient documentation

## 2014-03-09 DIAGNOSIS — Z7902 Long term (current) use of antithrombotics/antiplatelets: Secondary | ICD-10-CM | POA: Insufficient documentation

## 2014-03-09 DIAGNOSIS — E785 Hyperlipidemia, unspecified: Secondary | ICD-10-CM | POA: Insufficient documentation

## 2014-03-09 DIAGNOSIS — I129 Hypertensive chronic kidney disease with stage 1 through stage 4 chronic kidney disease, or unspecified chronic kidney disease: Secondary | ICD-10-CM | POA: Insufficient documentation

## 2014-03-09 HISTORY — DX: Chronic obstructive pulmonary disease, unspecified: J44.9

## 2014-03-09 HISTORY — PX: ESOPHAGOGASTRODUODENOSCOPY: SHX5428

## 2014-03-09 HISTORY — PX: BALLOON DILATION: SHX5330

## 2014-03-09 LAB — GLUCOSE, CAPILLARY: Glucose-Capillary: 99 mg/dL (ref 70–99)

## 2014-03-09 SURGERY — EGD (ESOPHAGOGASTRODUODENOSCOPY)
Anesthesia: Monitor Anesthesia Care

## 2014-03-09 MED ORDER — LACTATED RINGERS IV SOLN
INTRAVENOUS | Status: DC
  Administered 2014-03-09: 1000 mL via INTRAVENOUS

## 2014-03-09 MED ORDER — PROPOFOL 10 MG/ML IV BOLUS
INTRAVENOUS | Status: AC
  Filled 2014-03-09: qty 20

## 2014-03-09 MED ORDER — PROPOFOL 10 MG/ML IV BOLUS
INTRAVENOUS | Status: DC | PRN
  Administered 2014-03-09: 25 mg via INTRAVENOUS
  Administered 2014-03-09 (×2): 50 mg via INTRAVENOUS

## 2014-03-09 MED ORDER — LACTATED RINGERS IV SOLN
INTRAVENOUS | Status: DC | PRN
  Administered 2014-03-09: 11:00:00 via INTRAVENOUS

## 2014-03-09 MED ORDER — CLOPIDOGREL BISULFATE 75 MG PO TABS: 75.0000 mg | ORAL_TABLET | Freq: Every morning | ORAL | Status: DC

## 2014-03-09 MED ORDER — SODIUM CHLORIDE 0.9 % IV SOLN: INTRAVENOUS | Status: DC

## 2014-03-09 NOTE — Discharge Instructions (Signed)
Resume Plavix in 5 days    Esophagogastroduodenoscopy Care After Refer to this sheet in the next few weeks. These instructions provide you with information on caring for yourself after your procedure. Your caregiver may also give you more specific instructions. Your treatment has been planned according to current medical practices, but problems sometimes occur. Call your caregiver if you have any problems or questions after your procedure.  HOME CARE INSTRUCTIONS  Do not eat or drink anything until the numbing medicine (local anesthetic) has worn off and your gag reflex has returned. You will know that the local anesthetic has worn off when you can swallow comfortably.  Do not drive for 12 hours after the procedure or as directed by your caregiver.  Only take medicines as directed by your caregiver. SEEK MEDICAL CARE IF:   You cannot stop coughing.  You are not urinating at all or less than usual. SEEK IMMEDIATE MEDICAL CARE IF:  You have difficulty swallowing.  You cannot eat or drink.  You have worsening throat or chest pain.  You have dizziness, lightheadedness, or you faint.  You have nausea or vomiting.  You have chills.  You have a fever.  You have severe abdominal pain.  You have black, tarry, or bloody stools. Document Released: 09/04/2012 Document Reviewed: 09/04/2012 [facility] Patient Information 2014 [place], [place].

## 2014-03-09 NOTE — Transfer of Care (Signed)
Immediate Anesthesia Transfer of Care Note  Patient: Brendan Arroyo  Procedure(s) Performed: Procedure(s): ESOPHAGOGASTRODUODENOSCOPY (EGD) (N/A) BALLOON DILATION (N/A)  Patient Location: PACU  Anesthesia Type:MAC  Level of Consciousness: awake, sedated and patient cooperative  Airway & Oxygen Therapy: Patient Spontanous Breathing and Patient connected to nasal cannula oxygen  Post-op Assessment: Report given to PACU RN and Post -op Vital signs reviewed and stable  Post vital signs: Reviewed and stable  Complications: No apparent anesthesia complications

## 2014-03-09 NOTE — Op Note (Signed)
[facility] [address] [place], [address]   ENDOSCOPY PROCEDURE REPORT  PATIENT: Brendan Arroyo, Brendan Arroyo  MR#: [id number] BIRTHDATE: 03-Dec-1928 , 85  yrs. old GENDER: Male ENDOSCOPIST: Louis Meckel, MD ASSISTANT:   Morene Antu, Dwain Sarna, RN CGRN REFERRED [id number] Lysbeth Galas, M.D. PROCEDURE DATE:  03/09/2014 PROCEDURE:   EGD with balloon dilatation ASA CLASS:   Class II INDICATIONS:follow-up of stricture. MEDICATIONS: MAC sedation, administered by CRNA TOPICAL ANESTHETIC:  DESCRIPTION OF PROCEDURE:   After the risks benefits and alternatives of the procedure were thoroughly explained, informed consent was obtained.  The     endoscope was introduced through the mouth  and advanced to the third portion of the duodenum ,      The instrument was slowly withdrawn as the mucosa was carefully examined.    Again noted was a moderate stricture about 3 cm proximal to the GE junction.  The 9 mm gastroscope easily traversed the stricture. The remainder of the upper endoscopy exam was otherwise normal. Dilation was then performed at the distal esophagus  Dilator:Balloon Size:1516.77mm Heme:yes  There was a superficial mucosal tear following the 16.5 mm dilator insufflation.  There was a small amount of bleeding as well.  COMPLICATIONS: There were no complications. ENDOSCOPIC IMPRESSION: 1.   esophageal stricture-status post balloon dilation  RECOMMENDATIONS: 1.  resume Plavix in 5 days 2.  office visit 6 weeks  eSigned:  Louis Meckel, MD 03/09/2014 11:54 AM  CC:

## 2014-03-09 NOTE — Anesthesia Preprocedure Evaluation (Addendum)
Anesthesia Evaluation  Patient identified by MRN, date of birth, ID band Patient awake    Reviewed: Allergy & Precautions, H&P , NPO status , Patient's Chart, lab work & pertinent test results, reviewed documented beta blocker date and time   Airway Mallampati: II TM Distance: >3 FB Neck ROM: full    Dental  (+) Edentulous Upper, Edentulous Lower, Dental Advisory Given   Pulmonary neg pulmonary ROS, former smoker,  breath sounds clear to auscultation  Pulmonary exam normal       Cardiovascular Exercise Tolerance: Poor hypertension, On Medications and On Home Beta Blockers + CAD Rhythm:regular Rate:Normal     Neuro/Psych negative neurological ROS  negative psych ROS   GI/Hepatic negative GI ROS, Neg liver ROS, GERD-  Medicated and Controlled,  Endo/Other  diabetes  Renal/GU Renal diseasenegative Renal ROSStage 3 chronic renal disease  negative genitourinary   Musculoskeletal   Abdominal   Peds  Hematology negative hematology ROS (+)   Anesthesia Other Findings   Reproductive/Obstetrics negative OB ROS                          Anesthesia Physical Anesthesia Plan  ASA: IV  Anesthesia Plan: MAC   Post-op Pain Management:    Induction:   Airway Management Planned:   Additional Equipment:   Intra-op Plan:   Post-operative Plan:   Informed Consent: I have reviewed the patients History and Physical, chart, labs and discussed the procedure including the risks, benefits and alternatives for the proposed anesthesia with the patient or authorized representative who has indicated his/her understanding and acceptance.   Dental Advisory Given  Plan Discussed with: CRNA and Surgeon  Anesthesia Plan Comments:        Anesthesia Quick Evaluation

## 2014-03-09 NOTE — H&P (Signed)
History of Present Illness: 81 year old white male with history of hypertension, diabetes, esophageal stricture, coronary artery disease, on Plavix, here for followup endoscopy with dilatation.  Last dilation was in April, 2015.  Dysphagia has improved since this procedure. Past Medical History   Diagnosis  Date   .  Dyslipidemia    .  HTN (hypertension)    .  DM (diabetes mellitus)    .  Occlusion and stenosis of carotid artery without mention of cerebral infarction    .  Coronary atherosclerosis of artery bypass graft    .  H/O: gout    .  Diverticulitis    .  Colon polyps    .  Blood transfusion    .  GERD (gastroesophageal reflux disease)    .  Hyperlipidemia    .  Renal insufficiency    .  Bronchitis    .  Stricture and stenosis of esophagus  11/06/2011     egd with esoph stricture dilation 11/2011. [name]   .  Esophageal reflux  11/06/2011    Past Surgical History   Procedure  Laterality  Date   .  Cabg   1993   .  Coronary stent placement   July 2005, 2008     taxus stent of the distal right coronary artery   .  Elbow surgery       right   .  Tonsillectomy     .  Back surgery     .  Esophagogastroduodenoscopy   07/31/2012     Procedure: ESOPHAGOGASTRODUODENOSCOPY (EGD); Surgeon: Meryl Dare, MD,FACG; Location: [facility] ENDOSCOPY; Service: Endoscopy; Laterality: N/A; pt has food impaction, takes plavix, so no plans to dilate.   family history includes Diabetes in his brother and mother; Heart disease in his maternal grandfather; Ovarian cancer in his mother. There is no history of Colon cancer, Esophageal cancer, or Stomach cancer.  Current Outpatient Prescriptions   Medication  Sig  Dispense  Refill   .  amLODipine (NORVASC) 5 MG tablet  Take 2.5-5 mg by mouth 2 (two) times daily. Take 5mg  in the am and 2.5mg  in the pm     .  atorvastatin (LIPITOR) 20 MG tablet  Take 20 mg by mouth daily.     .  budesonide-formoterol (SYMBICORT) 160-4.5 MCG/ACT inhaler  Inhale 2 puffs  into the lungs 2 (two) times daily.     .  clopidogrel (PLAVIX) 75 MG tablet  Take 75 mg by mouth daily.     .  furosemide (LASIX) 40 MG tablet  TAKE ONE TABLET BY MOUTH ONCE DAILY AS NEEDED  30 tablet  0   .  gabapentin (NEURONTIN) 300 MG capsule  Take 300 mg by mouth daily.     Marland Kitchen  guaifenesin (HUMIBID E) 400 MG TABS tablet  Take 400 mg by mouth daily.     .  hydrochlorothiazide 25 MG tablet  Take 25 mg by mouth daily.     .  metFORMIN (GLUCOPHAGE) 500 MG tablet  Take 500 mg by mouth 2 (two) times daily.     .  metoprolol (LOPRESSOR) 50 MG tablet  Take 50 mg by mouth 2 (two) times daily.     .  Multiple Vitamins-Minerals ([employer] SPECTRAVITE SENIOR) TABS  Take 1 tablet by mouth daily.     .  nitroGLYCERIN (NITROSTAT) 0.4 MG SL tablet  Place 0.4 mg under the tongue every 5 (five) minutes as needed. For chest pain     .  OMEPRAZOLE PO  Take by mouth as needed.     Docia Barrier.  OXYGEN-HELIUM IN  Inhale into the lungs as directed.      No current facility-administered medications for this visit.    Allergies as of 12/16/2013   .  (No Known Allergies)   reports that he has quit smoking. He has never used smokeless tobacco. He reports that he drinks about 2.4 ounces of alcohol per week. He reports that he does not use illicit drugs.  Review of Systems: Patient has COPD and baseline dyspnea on exertion. Pertinent positive and negative review of systems were noted in the above HPI section. All other review of systems were otherwise negative.  Vital signs were reviewed in today's medical record  Physical Exam:  General: Well developed , well nourished, no acute distress  Skin: anicteric  Head: Normocephalic and atraumatic  Eyes: sclerae anicteric, EOMI  Ears: Normal auditory acuity  Mouth: No deformity or lesions  Neck: Supple, no masses or thyromegaly  Lungs: Clear throughout to auscultation  Heart: Regular rate and rhythm; no murmurs, rubs or bruits  Abdomen: Soft, non tender and non distended. No masses,  hepatosplenomegaly or hernias noted. Normal Bowel sounds  Rectal:deferred  Musculoskeletal: Symmetrical with no gross deformities  Skin: No lesions on visible extremities  Pulses: Normal pulses noted  Extremities: No clubbing, cyanosis, edema or deformities noted  Neurological: Alert oriented x 4, grossly nonfocal  Cervical Nodes: No significant cervical adenopathy  Inguinal Nodes: No significant inguinal adenopathy  Psychological: Alert and cooperative. Normal mood and affect   Impression-esophageal stricture  Plan-endoscopy with balloon dilation

## 2014-03-09 NOTE — Anesthesia Postprocedure Evaluation (Signed)
  Anesthesia Post-op Note  Patient: Brendan Arroyo  Procedure(s) Performed: Procedure(s) (LRB): ESOPHAGOGASTRODUODENOSCOPY (EGD) (N/A) BALLOON DILATION (N/A)  Patient Location: PACU  Anesthesia Type: MAC  Level of Consciousness: awake and alert   Airway and Oxygen Therapy: Patient Spontanous Breathing  Post-op Pain: mild  Post-op Assessment: Post-op Vital signs reviewed, Patient's Cardiovascular Status Stable, Respiratory Function Stable, Patent Airway and No signs of Nausea or vomiting  Last Vitals:  Filed Vitals:   03/09/14 1300  BP: 156/80  Pulse: 77  Temp:   Resp: 13    Post-op Vital Signs: stable   Complications: No apparent anesthesia complications

## 2014-03-10 ENCOUNTER — Encounter (HOSPITAL_COMMUNITY): Payer: Self-pay | Admitting: Gastroenterology

## 2014-04-26 ENCOUNTER — Observation Stay (HOSPITAL_COMMUNITY): Payer: Medicare Other

## 2014-04-26 ENCOUNTER — Observation Stay (HOSPITAL_COMMUNITY)
Admission: EM | Admit: 2014-04-26 | Discharge: 2014-04-29 | Disposition: A | Discharge status: Home / Self Care | Payer: Medicare Other | Attending: Internal Medicine | Admitting: Internal Medicine

## 2014-04-26 ENCOUNTER — Encounter (HOSPITAL_COMMUNITY): Payer: Self-pay | Admitting: Emergency Medicine

## 2014-04-26 ENCOUNTER — Emergency Department (HOSPITAL_COMMUNITY): Payer: Medicare Other

## 2014-04-26 DIAGNOSIS — Z7902 Long term (current) use of antithrombotics/antiplatelets: Secondary | ICD-10-CM | POA: Diagnosis not present

## 2014-04-26 DIAGNOSIS — I25810 Atherosclerosis of coronary artery bypass graft(s) without angina pectoris: Secondary | ICD-10-CM

## 2014-04-26 DIAGNOSIS — Z87891 Personal history of nicotine dependence: Secondary | ICD-10-CM | POA: Insufficient documentation

## 2014-04-26 DIAGNOSIS — K222 Esophageal obstruction: Secondary | ICD-10-CM | POA: Diagnosis present

## 2014-04-26 DIAGNOSIS — Z951 Presence of aortocoronary bypass graft: Secondary | ICD-10-CM | POA: Insufficient documentation

## 2014-04-26 DIAGNOSIS — E785 Hyperlipidemia, unspecified: Secondary | ICD-10-CM | POA: Insufficient documentation

## 2014-04-26 DIAGNOSIS — E860 Dehydration: Secondary | ICD-10-CM | POA: Insufficient documentation

## 2014-04-26 DIAGNOSIS — E119 Type 2 diabetes mellitus without complications: Secondary | ICD-10-CM | POA: Diagnosis not present

## 2014-04-26 DIAGNOSIS — R55 Syncope and collapse: Principal | ICD-10-CM | POA: Insufficient documentation

## 2014-04-26 DIAGNOSIS — Z9861 Coronary angioplasty status: Secondary | ICD-10-CM | POA: Insufficient documentation

## 2014-04-26 DIAGNOSIS — N179 Acute kidney failure, unspecified: Secondary | ICD-10-CM

## 2014-04-26 DIAGNOSIS — I10 Essential (primary) hypertension: Secondary | ICD-10-CM | POA: Diagnosis present

## 2014-04-26 DIAGNOSIS — R404 Transient alteration of awareness: Secondary | ICD-10-CM | POA: Insufficient documentation

## 2014-04-26 DIAGNOSIS — I779 Disorder of arteries and arterioles, unspecified: Secondary | ICD-10-CM | POA: Diagnosis present

## 2014-04-26 DIAGNOSIS — J449 Chronic obstructive pulmonary disease, unspecified: Secondary | ICD-10-CM | POA: Diagnosis not present

## 2014-04-26 DIAGNOSIS — N289 Disorder of kidney and ureter, unspecified: Secondary | ICD-10-CM | POA: Diagnosis not present

## 2014-04-26 DIAGNOSIS — Z860100 Personal history of colon polyps, unspecified: Secondary | ICD-10-CM | POA: Insufficient documentation

## 2014-04-26 DIAGNOSIS — I2510 Atherosclerotic heart disease of native coronary artery without angina pectoris: Secondary | ICD-10-CM | POA: Insufficient documentation

## 2014-04-26 DIAGNOSIS — I739 Peripheral vascular disease, unspecified: Secondary | ICD-10-CM

## 2014-04-26 DIAGNOSIS — Z79899 Other long term (current) drug therapy: Secondary | ICD-10-CM | POA: Insufficient documentation

## 2014-04-26 DIAGNOSIS — J9611 Chronic respiratory failure with hypoxia: Secondary | ICD-10-CM | POA: Diagnosis present

## 2014-04-26 DIAGNOSIS — J4489 Other specified chronic obstructive pulmonary disease: Secondary | ICD-10-CM | POA: Insufficient documentation

## 2014-04-26 HISTORY — DX: Peripheral vascular disease, unspecified: I73.9

## 2014-04-26 HISTORY — DX: Atherosclerotic heart disease of native coronary artery without angina pectoris: I25.10

## 2014-04-26 HISTORY — DX: Disorder of arteries and arterioles, unspecified: I77.9

## 2014-04-26 LAB — URINALYSIS, ROUTINE W REFLEX MICROSCOPIC
BILIRUBIN URINE: NEGATIVE
GLUCOSE, UA: NEGATIVE mg/dL
HGB URINE DIPSTICK: NEGATIVE
Ketones, ur: NEGATIVE mg/dL
Leukocytes, UA: NEGATIVE
Nitrite: NEGATIVE
PROTEIN: 30 mg/dL — AB
Specific Gravity, Urine: >1.03 — ABNORMAL HIGH (ref 1.005–1.030)
Urobilinogen, UA: 0.2 mg/dL (ref 0.0–1.0)
pH: 5 (ref 5.0–8.0)

## 2014-04-26 LAB — COMPREHENSIVE METABOLIC PANEL
ALT: 38 U/L (ref 0–53)
AST: 36 U/L (ref 0–37)
Albumin: 4.1 g/dL (ref 3.5–5.2)
Alkaline Phosphatase: 102 U/L (ref 39–117)
Anion gap: 18 — ABNORMAL HIGH (ref 5–15)
BUN: 45 mg/dL — ABNORMAL HIGH (ref 6–23)
CALCIUM: 9 mg/dL (ref 8.4–10.5)
CO2: 22 meq/L (ref 19–32)
Chloride: 100 mEq/L (ref 96–112)
Creatinine, Ser: 2.47 mg/dL — ABNORMAL HIGH (ref 0.50–1.35)
GFR calc non Af Amer: 23 mL/min — ABNORMAL LOW (ref >90)
GFR, EST AFRICAN AMERICAN: 26 mL/min — AB (ref >90)
GLUCOSE: 73 mg/dL (ref 70–99)
Potassium: 5.4 mEq/L — ABNORMAL HIGH (ref 3.7–5.3)
SODIUM: 140 meq/L (ref 137–147)
Total Bilirubin: 1.1 mg/dL (ref 0.3–1.2)
Total Protein: 6.6 g/dL (ref 6.0–8.3)

## 2014-04-26 LAB — CREATININE, SERUM
Creatinine, Ser: 2.48 mg/dL — ABNORMAL HIGH (ref 0.50–1.35)
GFR calc Af Amer: 26 mL/min — ABNORMAL LOW (ref >90)
GFR calc non Af Amer: 22 mL/min — ABNORMAL LOW (ref >90)

## 2014-04-26 LAB — CBG MONITORING, ED: GLUCOSE-CAPILLARY: 97 mg/dL (ref 70–99)

## 2014-04-26 LAB — CBC
HCT: 41.3 % (ref 39.0–52.0)
HCT: 40.8 % (ref 39.0–52.0)
HEMOGLOBIN: 13.5 g/dL (ref 13.0–17.0)
Hemoglobin: 13.6 g/dL (ref 13.0–17.0)
MCH: 29.9 pg (ref 26.0–34.0)
MCH: 30.5 pg (ref 26.0–34.0)
MCHC: 32.9 g/dL (ref 30.0–36.0)
MCHC: 33.1 g/dL (ref 30.0–36.0)
MCV: 90.8 fL (ref 78.0–100.0)
MCV: 92.1 fL (ref 78.0–100.0)
Platelets: 185 10*3/uL (ref 150–400)
Platelets: 182 10*3/uL (ref 150–400)
RBC: 4.55 MIL/uL (ref 4.22–5.81)
RBC: 4.43 MIL/uL (ref 4.22–5.81)
RDW: 14.5 % (ref 11.5–15.5)
RDW: 14.5 % (ref 11.5–15.5)
WBC: 14.3 10*3/uL — ABNORMAL HIGH (ref 4.0–10.5)
WBC: 11.2 10*3/uL — AB (ref 4.0–10.5)

## 2014-04-26 LAB — URINE MICROSCOPIC-ADD ON

## 2014-04-26 LAB — RAPID URINE DRUG SCREEN, HOSP PERFORMED
Amphetamines: NOT DETECTED
Barbiturates: NOT DETECTED
Benzodiazepines: NOT DETECTED
COCAINE: NOT DETECTED
OPIATES: NOT DETECTED
Tetrahydrocannabinol: NOT DETECTED

## 2014-04-26 LAB — I-STAT CHEM 8, ED
BUN: 43 mg/dL — ABNORMAL HIGH (ref 6–23)
Calcium, Ion: 1.14 mmol/L (ref 1.13–1.30)
Chloride: 107 mEq/L (ref 96–112)
Creatinine, Ser: 2.8 mg/dL — ABNORMAL HIGH (ref 0.50–1.35)
Glucose, Bld: 102 mg/dL — ABNORMAL HIGH (ref 70–99)
HEMATOCRIT: 43 % (ref 39.0–52.0)
HEMOGLOBIN: 14.6 g/dL (ref 13.0–17.0)
POTASSIUM: 5.4 meq/L — AB (ref 3.7–5.3)
Sodium: 139 mEq/L (ref 137–147)
TCO2: 19 mmol/L (ref 0–100)

## 2014-04-26 LAB — TROPONIN I: Troponin I: <0.3 ng/mL (ref <0.30)

## 2014-04-26 LAB — TSH: TSH: 3.24 u[IU]/mL (ref 0.350–4.500)

## 2014-04-26 LAB — I-STAT TROPONIN, ED: TROPONIN I, POC: 0.01 ng/mL (ref 0.00–0.08)

## 2014-04-26 LAB — GLUCOSE, CAPILLARY
Glucose-Capillary: 69 mg/dL — ABNORMAL LOW (ref 70–99)
Glucose-Capillary: 88 mg/dL (ref 70–99)
Glucose-Capillary: 129 mg/dL — ABNORMAL HIGH (ref 70–99)

## 2014-04-26 MED ORDER — LEVOFLOXACIN 750 MG PO TABS
750.0000 mg | ORAL_TABLET | ORAL | Status: DC
  Administered 2014-04-26 – 2014-04-28 (×2): 750 mg via ORAL
  Filled 2014-04-26 (×3): qty 1

## 2014-04-26 MED ORDER — CLOPIDOGREL BISULFATE 75 MG PO TABS
75.0000 mg | ORAL_TABLET | Freq: Every morning | ORAL | Status: DC
  Administered 2014-04-27 – 2014-04-29 (×3): 75 mg via ORAL
  Filled 2014-04-26 (×3): qty 1

## 2014-04-26 MED ORDER — BUDESONIDE-FORMOTEROL FUMARATE 160-4.5 MCG/ACT IN AERO
2.0000 | INHALATION_SPRAY | Freq: Two times a day (BID) | RESPIRATORY_TRACT | Status: DC
  Administered 2014-04-28: 2 via RESPIRATORY_TRACT
  Filled 2014-04-26 (×2): qty 6

## 2014-04-26 MED ORDER — DM-GUAIFENESIN ER 30-600 MG PO TB12
1.0000 | ORAL_TABLET | Freq: Two times a day (BID) | ORAL | Status: DC
  Administered 2014-04-26 – 2014-04-29 (×6): 1 via ORAL
  Filled 2014-04-26 (×7): qty 1

## 2014-04-26 MED ORDER — IPRATROPIUM-ALBUTEROL 0.5-2.5 (3) MG/3ML IN SOLN
3.0000 mL | RESPIRATORY_TRACT | Status: DC | PRN
  Administered 2014-04-28: 3 mL via RESPIRATORY_TRACT
  Filled 2014-04-26: qty 3

## 2014-04-26 MED ORDER — ATORVASTATIN CALCIUM 20 MG PO TABS
20.0000 mg | ORAL_TABLET | Freq: Every morning | ORAL | Status: DC
  Administered 2014-04-27 – 2014-04-29 (×3): 20 mg via ORAL
  Filled 2014-04-26 (×3): qty 1

## 2014-04-26 MED ORDER — POLYVINYL ALCOHOL 1.4 % OP SOLN: 1.0000 [drp] | Freq: Three times a day (TID) | OPHTHALMIC | Status: DC | PRN

## 2014-04-26 MED ORDER — INSULIN ASPART 100 UNIT/ML ~~LOC~~ SOLN
0.0000 [IU] | Freq: Three times a day (TID) | SUBCUTANEOUS | Status: DC
  Administered 2014-04-27: 2 [IU] via SUBCUTANEOUS
  Administered 2014-04-28: 5 [IU] via SUBCUTANEOUS
  Administered 2014-04-28: 3 [IU] via SUBCUTANEOUS
  Administered 2014-04-28: 8 [IU] via SUBCUTANEOUS

## 2014-04-26 MED ORDER — SODIUM CHLORIDE 0.9 % IV BOLUS (SEPSIS): 500.0000 mL | Freq: Once | INTRAVENOUS | Status: DC

## 2014-04-26 MED ORDER — SODIUM CHLORIDE 0.9 % IJ SOLN
3.0000 mL | Freq: Two times a day (BID) | INTRAMUSCULAR | Status: DC
  Administered 2014-04-27 – 2014-04-29 (×4): 3 mL via INTRAVENOUS

## 2014-04-26 MED ORDER — ONDANSETRON HCL 4 MG/2ML IJ SOLN
4.0000 mg | Freq: Four times a day (QID) | INTRAMUSCULAR | Status: DC | PRN
  Administered 2014-04-26 – 2014-04-27 (×2): 4 mg via INTRAVENOUS
  Filled 2014-04-26 (×2): qty 2

## 2014-04-26 MED ORDER — HYPROMELLOSE (GONIOSCOPIC) 2.5 % OP SOLN
1.0000 [drp] | Freq: Three times a day (TID) | OPHTHALMIC | Status: DC | PRN
  Filled 2014-04-26: qty 15

## 2014-04-26 MED ORDER — BENZONATATE 100 MG PO CAPS
200.0000 mg | ORAL_CAPSULE | Freq: Two times a day (BID) | ORAL | Status: DC
  Administered 2014-04-26 – 2014-04-29 (×6): 200 mg via ORAL
  Filled 2014-04-26 (×8): qty 2

## 2014-04-26 MED ORDER — ONDANSETRON HCL 4 MG/2ML IJ SOLN: 4.0000 mg | Freq: Three times a day (TID) | INTRAMUSCULAR | Status: DC | PRN

## 2014-04-26 MED ORDER — HYDROCODONE-ACETAMINOPHEN 5-325 MG PO TABS
0.5000 | ORAL_TABLET | Freq: Every day | ORAL | Status: DC | PRN
  Administered 2014-04-26: 1 via ORAL
  Filled 2014-04-26: qty 1

## 2014-04-26 MED ORDER — SODIUM CHLORIDE 0.9 % IV SOLN
INTRAVENOUS | Status: AC
  Administered 2014-04-26: 100 mL via INTRAVENOUS

## 2014-04-26 MED ORDER — SODIUM CHLORIDE 0.9 % IV BOLUS (SEPSIS)
500.0000 mL | Freq: Once | INTRAVENOUS | Status: AC
  Administered 2014-04-26: 500 mL via INTRAVENOUS

## 2014-04-26 MED ORDER — TIOTROPIUM BROMIDE MONOHYDRATE 18 MCG IN CAPS
18.0000 ug | ORAL_CAPSULE | Freq: Every day | RESPIRATORY_TRACT | Status: DC
  Administered 2014-04-28 – 2014-04-29 (×2): 18 ug via RESPIRATORY_TRACT
  Filled 2014-04-26 (×2): qty 5

## 2014-04-26 MED ORDER — IPRATROPIUM-ALBUTEROL 0.5-2.5 (3) MG/3ML IN SOLN
3.0000 mL | RESPIRATORY_TRACT | Status: DC
  Filled 2014-04-26: qty 3

## 2014-04-26 MED ORDER — FLUTICASONE PROPIONATE 50 MCG/ACT NA SUSP
2.0000 | Freq: Every day | NASAL | Status: DC | PRN
Start: 2014-04-26 — End: 2014-04-29
  Filled 2014-04-26: qty 16

## 2014-04-26 MED ORDER — ONDANSETRON HCL 4 MG PO TABS: 4.0000 mg | ORAL_TABLET | Freq: Four times a day (QID) | ORAL | Status: DC | PRN

## 2014-04-26 MED ORDER — HEPARIN SODIUM (PORCINE) 5000 UNIT/ML IJ SOLN
5000.0000 [IU] | Freq: Three times a day (TID) | INTRAMUSCULAR | Status: DC
  Administered 2014-04-27 – 2014-04-29 (×5): 5000 [IU] via SUBCUTANEOUS
  Filled 2014-04-26 (×12): qty 1

## 2014-04-26 NOTE — ED Notes (Signed)
Pt back from CT.  Monitors on.

## 2014-04-26 NOTE — ED Provider Notes (Signed)
CSN: [id number]     Arrival date & time 04/26/14  1205 History   First MD Initiated Contact with Patient 04/26/14 1219     Chief Complaint  Patient presents with  . Loss of Consciousness      HPI To room via EMS. Pt walking in to church felt dizzy, lost consciousness for few seconds, fell backwards landing on concrete hitting back of head. Lump on back of head. When EMS arrived to pt he was cyanotic at corners of mouth, non re-breather @ 15L applied. Lip color back to normal. Pt refused spine board and HHN on scene. Pt states HHN makes heart rate increase. No c/o dizziness, shortness of breath at this time.   Past Medical History  Diagnosis Date  . Dyslipidemia   . HTN (hypertension)   . DM (diabetes mellitus)   . Occlusion and stenosis of carotid artery without mention of cerebral infarction   . Coronary atherosclerosis of artery bypass graft   . H/O: gout   . Diverticulitis   . Colon polyps   . Blood transfusion     '93  . GERD (gastroesophageal reflux disease)   . Hyperlipidemia   . Renal insufficiency   . Bronchitis   . Stricture and stenosis of esophagus 11/06/2011    egd with esoph stricture dilation 11/2011.  Melvia Heapsrobert Kaplan MD  . Esophageal reflux 11/06/2011  . Shortness of breath     Oxygen use 2l/m nasally usually nightly and during day as needed  . COPD (chronic obstructive pulmonary disease)   . History of oxygen administration 02-20-14    oxygen 2 l/m nasaly at night and during day as needed.   Past Surgical History  Procedure Laterality Date  . Cabg  1993  . Coronary stent placement  July 2005, 2008    taxus stent of the distal right coronary artery   . Elbow surgery      right  . Tonsillectomy    . Back surgery    . Esophagogastroduodenoscopy  07/31/2012    Procedure: ESOPHAGOGASTRODUODENOSCOPY (EGD);  Surgeon: Meryl DareMalcolm T Stark, MD,FACG;  [facility];  Service: Endoscopy;  Laterality: N/A;  pt has food impaction, takes plavix, so no plans to dilate.  .  Coronary artery bypass graft    . Esophagogastroduodenoscopy N/A 01/07/2014    Procedure: ESOPHAGOGASTRODUODENOSCOPY (EGD);  Surgeon: Louis Meckelobert D Kaplan, MD;  [facility];  Service: Endoscopy;  Laterality: N/A;  . Balloon dilation N/A 01/07/2014    Procedure: BALLOON DILATION;  Surgeon: Louis Meckelobert D Kaplan, MD;  Location: [facility] ENDOSCOPY;  Service: Endoscopy;  Laterality: N/A;  . Esophagogastroduodenoscopy N/A 03/09/2014    Procedure: ESOPHAGOGASTRODUODENOSCOPY (EGD);  Surgeon: Louis Meckelobert D Kaplan, MD;  [facility];  Service: Endoscopy;  Laterality: N/A;  . Balloon dilation N/A 03/09/2014    Procedure: BALLOON DILATION;  Surgeon: Louis Meckelobert D Kaplan, MD;  Location: [facility] ENDOSCOPY;  Service: Endoscopy;  Laterality: N/A;   Family History  Problem Relation Age of Onset  . Ovarian cancer Mother   . Diabetes Mother   . Diabetes Brother   . Heart disease Maternal Grandfather   . Colon cancer Neg Hx   . Esophageal cancer Neg Hx   . Stomach cancer Neg Hx    History  Substance Use Topics  . Smoking status: Former Games developermoker  . Smokeless tobacco: Never Used     Comment: quit 1 year ago (2011?). has a 40 pack-year history   . Alcohol Use: 2.4 oz/week    4 Shots of  liquor per week     Comment: social -occ.    Review of Systems  All other systems reviewed and are negative  Allergies  Review of patient's allergies indicates no known allergies.  Home Medications   Prior to Admission medications   Medication Sig Start Date End Date Taking? Authorizing Provider  amLODipine (NORVASC) 5 MG tablet Take 2.5-5 mg by mouth 2 (two) times daily. Take 5mg  in the am and 2.5mg  in the pm   Yes Historical Provider, MD  atorvastatin (LIPITOR) 20 MG tablet Take 20 mg by mouth every morning.    Yes Historical Provider, MD  budesonide-formoterol (SYMBICORT) 160-4.5 MCG/ACT inhaler Inhale 2 puffs into the lungs 2 (two) times daily.   Yes Historical Provider, MD  clopidogrel (PLAVIX) 75 MG tablet Take 1 tablet (75 mg  total) by mouth every morning. 03/09/14  Yes Louis Meckel, MD  Cyanocobalamin (VITAMIN B-12 IJ) Inject 1,000 mcg as directed every 30 (thirty) days.   Yes Historical Provider, MD  fluticasone (FLONASE) 50 MCG/ACT nasal spray Place 2 sprays into both nostrils daily as needed for allergies.    Yes Historical Provider, MD  furosemide (LASIX) 40 MG tablet Take 20 mg by mouth every morning.    Yes Historical Provider, MD  gabapentin (NEURONTIN) 300 MG capsule Take 300 mg by mouth daily.   Yes Historical Provider, MD  guaifenesin (HUMIBID E) 400 MG TABS tablet Take 400 mg by mouth 2 (two) times daily.    Yes Historical Provider, MD  hydrochlorothiazide 25 MG tablet Take 25 mg by mouth every morning.    Yes Historical Provider, MD  HYDROcodone-acetaminophen (NORCO/VICODIN) 5-325 MG per tablet Take 0.5-1 tablets by mouth daily as needed for moderate pain.   Yes Historical Provider, MD  hydroxypropyl methylcellulose (ISOPTO TEARS) 2.5 % ophthalmic solution Place 1 drop into both eyes 3 (three) times daily as needed for dry eyes.   Yes Historical Provider, MD  metoprolol (LOPRESSOR) 50 MG tablet Take 50 mg by mouth 2 (two) times daily.     Yes Historical Provider, MD  Multiple Vitamins-Minerals ([employer] SPECTRAVITE SENIOR) TABS Take 1 tablet by mouth daily.     Yes Historical Provider, MD  nitroGLYCERIN (NITROSTAT) 0.4 MG SL tablet Place 1 tablet (0.4 mg total) under the tongue every 5 (five) minutes as needed. For chest pain 01/26/14  Yes Tonny Bollman, MD  tiotropium [facility]) 18 MCG inhalation capsule Place 18 mcg into inhaler and inhale daily.   Yes Historical Provider, MD  glipiZIDE (GLUCOTROL XL) 5 MG 24 hr tablet Take 5 mg by mouth daily with breakfast.    Historical Provider, MD  metFORMIN (GLUCOPHAGE) 500 MG tablet Take 500 mg by mouth 2 (two) times daily.     Historical Provider, MD   BP 117/56  Pulse 50  Temp(Src) 97.5 F (36.4 C) (Axillary)  Resp 14  SpO2 92% Physical Exam Physical Exam   Nursing note and vitals reviewed. Constitutional: He is oriented to person, place, and time. He appears well-developed and well-nourished. No distress.  HENT:  Head: Normocephalic and hematoma noted right parietal-occipital area  Eyes: Pupils are equal, round, and reactive to light.  Neck: Normal range of motion.  Cardiovascular: Normal rate and intact distal pulses.   Pulmonary/Chest: No respiratory distress.  Abdominal: Normal appearance. He exhibits no distension.  Musculoskeletal: Normal range of motion.  Neurological: He is alert and oriented to person, place, and time. No cranial nerve deficit.  No focal or lateralizing neurologic findings.  Skin: Skin is warm and dry. No rash noted.  Psychiatric: He has a normal mood and affect. His behavior is normal.   ED Course  Procedures (including critical care time) Labs Review Labs Reviewed  I-STAT CHEM 8, ED - Abnormal; Notable for the following:    Potassium 5.4 (*)    BUN 43 (*)    Creatinine, Ser 2.80 (*)    Glucose, Bld 102 (*)    All other components within normal limits  CBG MONITORING, ED  I-STAT TROPOININ, ED    Imaging Review Ct Head Wo Contrast  04/26/2014   CLINICAL DATA:  80 year old male with dizziness, loss of consciousness, fall and head injury.  EXAM: CT HEAD WITHOUT CONTRAST  TECHNIQUE: Contiguous axial images were obtained from the base of the skull through the vertex without intravenous contrast.  COMPARISON:  10/15/2008 head CT  FINDINGS: Mild generalized cerebral volume loss is noted.  No acute intracranial abnormalities are identified, including mass lesion or mass effect, hydrocephalus, extra-axial fluid collection, midline shift, hemorrhage, or acute infarction.  The visualized bony calvarium is unremarkable.  IMPRESSION: No evidence of acute intracranial abnormality.   Electronically Signed   By: Laveda Abbe M.D.   On: 04/26/2014 13:34     EKG Interpretation   Date/Time:  Sunday April 26 2014 12:12:48  EDT Ventricular Rate:  55 PR Interval:  223 QRS Duration: 89 QT Interval:  456 QTC Calculation: 436 R Axis:   96 Text Interpretation:  Sinus rhythm Prolonged PR interval Probable RVH w/  secondary repol abnormality Nonspecific T abnormalities, lateral leads ,  inferior leads which appear changed from 2013 Abnormal ekg Confirmed by  Radford Pax  MD, Tifanie Gardiner ([id number]) on 04/26/2014 12:20:46 PM      MDM   Final diagnoses:  Syncope, unspecified syncope type  Dehydration  Renal insufficiency        Nelia Shi, MD 04/26/14 1422

## 2014-04-26 NOTE — ED Notes (Signed)
To room via EMS. Pt walking in to church felt dizzy, lost consciousness for few seconds, fell backwards landing on concrete hitting back of head.  Lump on back of head.  When EMS arrived to pt he was cyanotic at corners of mouth, non re-breather @ 15L applied.  Lip color back to normal. Pt refused spine board and HHN on scene.  Pt states HHN makes heart rate increase.  No c/o dizziness, shortness of breath at this time.

## 2014-04-26 NOTE — Progress Notes (Signed)
Dr. Beckie Saltsivet updated with nausea and vomiting. Zofran 4 mg IV given as PRN. 12 Lead EKG done indicating TWI on anterolateral and inferior leads. Will continue to monitor.

## 2014-04-26 NOTE — ED Notes (Signed)
Patient transported to X-ray 

## 2014-04-26 NOTE — ED Notes (Signed)
Patient transported to CT 

## 2014-04-26 NOTE — H&P (Signed)
Date: 04/26/2014               Patient Name:  Brendan Arroyo MRN: [id number]  DOB: Dec 15, 1928 Age / Sex: 80 y.o., male   PCP: Joette Catching, MD         Medical Service: Internal Medicine Teaching Service         Attending Physician: Dr. Aletta Edouard, MD    First Contact: Dr. Rich Number Pager: [phone]  Second Contact: Dr. Christen Bame Pager: [phone]       After Hours (After 5p/  First Contact Pager: [phone]  weekends / holidays): Second Contact Pager: [phone]   Chief Complaint: Syncope  History of Present Illness: [name] is a 80yo man w/ PMHx of COPD (on 2L O2 via [place] at home), CAD s/p CABG in 1993 (cardiac cath in 01/2011 w/ stenting of RCA), DM type 2 (last HbA1c 6.8 on 01/29/14), carotid stenosis (carotid duplex on 08/21/12 showed 40-59% stenosis in ICAs bilaterally), and HTN who presented to the ED with syncope. Patient reports he felt "woozy" this AM while eating breakfast and doing morning chores. While going up the steps to church, he recalls grabbing the arm rail and then suddenly losing consciousness. He denies tunnel vision or black spots before passing out. Patient states he must of been out for only a few seconds because he remembers falling backward and hitting his head on the concrete. He denies palpitations or chest pain before or after the syncopal event. Fall was witnessed by his family member and he had no seizure-like movements or loss of bladder or bowel control.   His family member reports the patient is normally short of breath at baseline. He is on 2L O2 via  at home. He reports increased dyspnea with exertion and has difficulty completing sentences without becoming short of breath. He reports frequent nonproductive cough. He denies nausea, vomiting, abdominal pain.  In the ED, patient had a CT head that was negative for an acute process.  Meds: Current Facility-Administered Medications  Medication Dose Route Frequency Provider Last Rate Last Dose  . 0.9  %  sodium chloride infusion   Intravenous STAT Nelia Shi, MD      . Melene Muller ON 04/27/2014] atorvastatin (LIPITOR) tablet 20 mg  20 mg Oral q morning - 10a Christen Bame, MD      . budesonide-formoterol (SYMBICORT) 160-4.5 MCG/ACT inhaler 2 puff  2 puff Inhalation BID Christen Bame, MD      . Melene Muller ON 04/27/2014] clopidogrel (PLAVIX) tablet 75 mg  75 mg Oral q morning - 10a Christen Bame, MD      . dextromethorphan-guaiFENesin (MUCINEX DM) 30-600 MG per 12 hr tablet 1 tablet  1 tablet Oral BID Christen Bame, MD      . fluticasone (FLONASE) 50 MCG/ACT nasal spray 2 spray  2 spray Each Nare Daily PRN Christen Bame, MD      . heparin injection 5,000 Units  5,000 Units Subcutaneous 3 times per day Christen Bame, MD      . HYDROcodone-acetaminophen (NORCO/VICODIN) 5-325 MG per tablet 0.5-1 tablet  0.5-1 tablet Oral Daily PRN Christen Bame, MD      . hydroxypropyl methylcellulose (ISOPTO TEARS) 2.5 % ophthalmic solution 1 drop  1 drop Both Eyes TID PRN Christen Bame, MD      . insulin aspart (novoLOG) injection 0-15 Units  0-15 Units Subcutaneous TID WC Christen Bame, MD      . ondansetron [facility]) tablet 4 mg  4 mg  Oral Q6H PRN Christen Bame, MD       Or  . ondansetron [facility]) injection 4 mg  4 mg Intravenous Q6H PRN Christen Bame, MD      . sodium chloride 0.9 % injection 3 mL  3 mL Intravenous Q12H Christen Bame, MD      . Melene Muller ON 04/27/2014] tiotropium (SPIRIVA) inhalation capsule 18 mcg  18 mcg Inhalation Daily Christen Bame, MD        Allergies: Allergies as of 04/26/2014  . (No Known Allergies)   Past Medical History  Diagnosis Date  . Dyslipidemia   . HTN (hypertension)   . DM (diabetes mellitus)   . Occlusion and stenosis of carotid artery without mention of cerebral infarction   . Coronary atherosclerosis of artery bypass graft   . H/O: gout   . Diverticulitis   . Colon polyps   . Blood transfusion     '93  . GERD (gastroesophageal reflux disease)   . Hyperlipidemia   . Renal insufficiency   . Bronchitis   .  Stricture and stenosis of esophagus 11/06/2011    egd with esoph stricture dilation 11/2011.  [name]  . Esophageal reflux 11/06/2011  . Shortness of breath     Oxygen use 2l/m nasally usually nightly and during day as needed  . COPD (chronic obstructive pulmonary disease)   . History of oxygen administration 02-20-14    oxygen 2 l/m nasaly at night and during day as needed.   Past Surgical History  Procedure Laterality Date  . Cabg  1993  . Coronary stent placement  July 2005, 2008    taxus stent of the distal right coronary artery   . Elbow surgery      right  . Tonsillectomy    . Back surgery    . Esophagogastroduodenoscopy  07/31/2012    Procedure: ESOPHAGOGASTRODUODENOSCOPY (EGD);  Surgeon: Meryl Dare, MD,FACG;  Location: [facility] ENDOSCOPY;  Service: Endoscopy;  Laterality: N/A;  pt has food impaction, takes plavix, so no plans to dilate.  . Coronary artery bypass graft    . Esophagogastroduodenoscopy N/A 01/07/2014    Procedure: ESOPHAGOGASTRODUODENOSCOPY (EGD);  Surgeon: Louis Meckel, MD;  Location: [facility] ENDOSCOPY;  Service: Endoscopy;  Laterality: N/A;  . Balloon dilation N/A 01/07/2014    Procedure: BALLOON DILATION;  Surgeon: Louis Meckel, MD;  Location: [facility] ENDOSCOPY;  Service: Endoscopy;  Laterality: N/A;  . Esophagogastroduodenoscopy N/A 03/09/2014    Procedure: ESOPHAGOGASTRODUODENOSCOPY (EGD);  Surgeon: Louis Meckel, MD;  Location: [facility] ENDOSCOPY;  Service: Endoscopy;  Laterality: N/A;  . Balloon dilation N/A 03/09/2014    Procedure: BALLOON DILATION;  Surgeon: Louis Meckel, MD;  Location: [facility] ENDOSCOPY;  Service: Endoscopy;  Laterality: N/A;   Family History  Problem Relation Age of Onset  . Ovarian cancer Mother   . Diabetes Mother   . Diabetes Brother   . Heart disease Maternal Grandfather   . Colon cancer Neg Hx   . Esophageal cancer Neg Hx   . Stomach cancer Neg Hx    History   Social History  . Marital Status: Widowed    Spouse Name: N/A    Number of  Children: 1  . Years of Education: N/A   Occupational History  . retired    Social History Main Topics  . Smoking status: Former Games developer  . Smokeless tobacco: Never Used     Comment: quit 1 year ago (2011?). has a 40 pack-year history   . Alcohol Use:  2.4 oz/week    4 Shots of liquor per week     Comment: social -occ.  . Drug Use: No  . Sexual Activity: Not on file   Other Topics Concern  . Not on file   Social History Narrative   Lives in [place] and is retired. Tries to eat a heart health diet and exercises regularly. Only interval medication is multivitamin.     Review of Systems: General: Denies recent weight changes, malaise, night sweats. HEENT: Denies headaches, ear pain, rhinorrhea, sore throat CV: Denies orthopnea, PND Pulm: Denies wheezing GI: Denies diarrhea, constipation, melena, hematochezia GU: Denies dysuria, hematuria, difficulty urinating Msk: Denies muscle pains, joint pains Skin: Denies recent rashes, bruises.   Physical Exam: Blood pressure 127/37, pulse 57, temperature 97.5 F (36.4 C), temperature source Axillary, resp. rate 15, SpO2 95.00%. General: alert, cooperative, pleasant, NAD HEENT: [place], 5 cm hematoma on right side, back of head, tender to touch. EOMI, PERRL, mucus membranes dry Neck: supple, no JVD CV: RRR, normal S1/S2, no m/g/r, no carotid bruits Pulm: CTA bilaterally, breathing comfortably on 2 L O2 via [place]. Pt becomes SOB while talking, difficult to complete sentences without taking breath. Pt becomes flushed in face easily while talking, laughing. Abd: BS+, soft, nondistended, nontender Ext: warm, moves all, 1+ edema Neuro: alert and oriented x 3, CNs II- XII intact  Lab results: Basic Metabolic Panel:  Recent Labs  40/98/11 1241  NA 139  K 5.4*  CL 107  GLUCOSE 102*  BUN 43*  CREATININE 2.80*   Liver Function Tests: No results found for this basename: AST, ALT, ALKPHOS, BILITOT, PROT, ALBUMIN,  in the last 72 hours No  results found for this basename: LIPASE, AMYLASE,  in the last 72 hours No results found for this basename: AMMONIA,  in the last 72 hours CBC:  Recent Labs  04/26/14 1241 04/26/14 1345  WBC  --  14.3*  HGB 14.6 13.6  HCT 43.0 41.3  MCV  --  90.8  PLT  --  185   Cardiac Enzymes: No results found for this basename: CKTOTAL, CKMB, CKMBINDEX, TROPONINI,  in the last 72 hours BNP: No results found for this basename: PROBNP,  in the last 72 hours D-Dimer: No results found for this basename: DDIMER,  in the last 72 hours CBG:  Recent Labs  04/26/14 1223  GLUCAP 97   Hemoglobin A1C: No results found for this basename: HGBA1C,  in the last 72 hours Fasting Lipid Panel: No results found for this basename: CHOL, HDL, LDLCALC, TRIG, CHOLHDL, LDLDIRECT,  in the last 72 hours Thyroid Function Tests: No results found for this basename: TSH, T4TOTAL, FREET4, T3FREE, THYROIDAB,  in the last 72 hours Anemia Panel: No results found for this basename: VITAMINB12, FOLATE, FERRITIN, TIBC, IRON, RETICCTPCT,  in the last 72 hours Coagulation: No results found for this basename: LABPROT, INR,  in the last 72 hours Urine Drug Screen: Drugs of Abuse  No results found for this basename: labopia, cocainscrnur, labbenz, amphetmu, thcu, labbarb    Alcohol Level: No results found for this basename: ETH,  in the last 72 hours Urinalysis:  Recent Labs  04/26/14 1533  COLORURINE YELLOW  LABSPEC >1.030*  PHURINE 5.0  GLUCOSEU NEGATIVE  HGBUR NEGATIVE  BILIRUBINUR NEGATIVE  KETONESUR NEGATIVE  PROTEINUR 30*  UROBILINOGEN 0.2  NITRITE NEGATIVE  LEUKOCYTESUR NEGATIVE     Imaging results:  Dg Chest 2 View  04/26/2014   CLINICAL DATA:  Syncope, weakness, dizziness and cough.  EXAM: CHEST  2 VIEW  COMPARISON:  12/05/2012  FINDINGS: The heart is enlarged but stable. There are surgical changes from bypass surgery. There is tortuosity and calcification of the thoracic aorta. There are chronic  bronchitic type interstitial lung changes but no definite acute overlying infiltrates or effusions.  IMPRESSION: Cardiac enlargement and chronic lung changes but no acute overlying pulmonary findings.   Electronically Signed   By: Loralie ChampagneMark  Gallerani M.D.   On: 04/26/2014 15:31   Ct Head Wo Contrast  04/26/2014   CLINICAL DATA:  80 year old male with dizziness, loss of consciousness, fall and head injury.  EXAM: CT HEAD WITHOUT CONTRAST  TECHNIQUE: Contiguous axial images were obtained from the base of the skull through the vertex without intravenous contrast.  COMPARISON:  10/15/2008 head CT  FINDINGS: Mild generalized cerebral volume loss is noted.  No acute intracranial abnormalities are identified, including mass lesion or mass effect, hydrocephalus, extra-axial fluid collection, midline shift, hemorrhage, or acute infarction.  The visualized bony calvarium is unremarkable.  IMPRESSION: No evidence of acute intracranial abnormality.   Electronically Signed   By: Laveda AbbeJeff  Hu M.D.   On: 04/26/2014 13:34    Other results: EKG: Sinus rhythm, T wave inversions in all leads, unchanged from previous EKG on 01/26/14  Assessment & Plan: [name] is a 80yo man w/ PMHx of COPD (on 2L O2 via [place] at home), CAD s/p CABG in 1993 (cardiac cath in 01/2011 w/ stenting of RCA), DM type 2 (last HbA1c 6.8 on 01/29/14), carotid stenosis (carotid duplex on 08/21/12 showed 40-59% stenosis in ICAs bilaterally), and HTN who presented to the ED with syncope.   1. Syncope: Pt had syncopal episode this AM while going up the stairs. Pt denies palpitations, tunnel vision, or black spots before passing out. Pt reports he does remember falling and hitting his head on the concrete. CT scan done in ED is negative for acute bleed. Patient does have extensive cardiac hx with CABG in 1993 and stent placement in RCA in 2012. He had a carotid doppler in 08/2012 which showed 40-59% bilateral ICA stenosis. Pt also has hx of COPD and is on 2L O2 via  [place] at home. He reports increased dyspnea with exertion and frequent dry cough recently. On exam, patient becomes SOB very easily while talking/laughing and becomes flushed. He has a large hematoma on the back of his head (right side) from hitting his head. No carotid bruits were heard. Based on this information, patient likely had syncope 2/2 lack of perfusion. His severe COPD and mild carotid stenosis are likely contributing factors. Troponins negative x 1. CXR shows cardiomegaly and chronic lung changes.  - Carotid doppler ordered - Cardiac monitoring - Orthostatic VS - f/u troponins - NS @ 100 ml/hr - Spiriva 18 mcg daily - Duonebs Q4H - Levofloxacin 750 mg PO Q48H' - O2 therapy, pulse ox  2. COPD: Patient has hx of COPD and uses home O2. He takes Symbicort and Spiriva at home. - Continue home O2 via [place] - Pulse oximetry - Monitor VS  3. CAD s/p CABG in 1993 (Cardiac cath in 01/2011 w/ Stenting of RCA): Pt takes Atorvastatin 20 mg daily, Plavix 75 mg daily, Amlodipine 5 mg BID, Lasix 40 mg daily, HCTZ 25 mg daily, and Metoprolol 50 mg BID at home. - Hold BP meds for now - Continue Atorvastatin, Plavix - Cardiac monitoring  4. DM Type 2: Last HbA1c 6.8 on 01/29/14. Blood glucose 97 on admission. Pt on Metformin 500  mg BID and Gabapentin 300 mg daily at home. - Moderate ISS - CBGs 4 times daily  5. HTN: BP 109/75 on admission. Pt takes Amlodipine 5 mg BID, Lasix 40 mg daily, HCTZ 25 mg daily, and Metoprolol 50 mg BID at home. - Hold BP meds for now since hypotensive on admission - Monitor VS  Dispo: Disposition is deferred at this time, awaiting improvement of current medical problems. Anticipated discharge in approximately 1-2 day(s).   The patient does have a current PCP Joette Catching, MD) and does need an [facility] hospital follow-up appointment after discharge.  The patient does not have transportation limitations that hinder transportation to clinic appointments.  Signed: Rich Number, MD 04/26/2014, 4:20 PM

## 2014-04-26 NOTE — Progress Notes (Signed)
EKG changes noted .Asked  ED RN  to  request ER MD  for cardiology  follow-up.  No Cardiology follow -up at this time per RN return call. Internal Medicine to follow-up.

## 2014-04-26 NOTE — Progress Notes (Signed)
Dr. Beckie Saltsivet updated with ? EKG changes.

## 2014-04-26 NOTE — ED Notes (Signed)
Dizziness noted when EMT sat HOB up, resolved once pt was sat up.

## 2014-04-27 ENCOUNTER — Observation Stay (HOSPITAL_COMMUNITY): Payer: Medicare Other

## 2014-04-27 ENCOUNTER — Encounter (HOSPITAL_COMMUNITY): Payer: Self-pay | Admitting: General Practice

## 2014-04-27 DIAGNOSIS — I779 Disorder of arteries and arterioles, unspecified: Secondary | ICD-10-CM | POA: Diagnosis present

## 2014-04-27 DIAGNOSIS — I739 Peripheral vascular disease, unspecified: Secondary | ICD-10-CM

## 2014-04-27 DIAGNOSIS — N289 Disorder of kidney and ureter, unspecified: Secondary | ICD-10-CM | POA: Diagnosis not present

## 2014-04-27 DIAGNOSIS — J9611 Chronic respiratory failure with hypoxia: Secondary | ICD-10-CM | POA: Diagnosis present

## 2014-04-27 DIAGNOSIS — I369 Nonrheumatic tricuspid valve disorder, unspecified: Secondary | ICD-10-CM

## 2014-04-27 DIAGNOSIS — E860 Dehydration: Secondary | ICD-10-CM | POA: Diagnosis not present

## 2014-04-27 DIAGNOSIS — J449 Chronic obstructive pulmonary disease, unspecified: Secondary | ICD-10-CM | POA: Diagnosis present

## 2014-04-27 DIAGNOSIS — I2510 Atherosclerotic heart disease of native coronary artery without angina pectoris: Secondary | ICD-10-CM | POA: Diagnosis present

## 2014-04-27 DIAGNOSIS — R404 Transient alteration of awareness: Secondary | ICD-10-CM | POA: Diagnosis not present

## 2014-04-27 DIAGNOSIS — R55 Syncope and collapse: Secondary | ICD-10-CM

## 2014-04-27 LAB — HEMOGLOBIN A1C
Hgb A1c MFr Bld: 6.6 % — ABNORMAL HIGH (ref <5.7)
MEAN PLASMA GLUCOSE: 143 mg/dL — AB (ref <117)

## 2014-04-27 LAB — GLUCOSE, CAPILLARY
GLUCOSE-CAPILLARY: 149 mg/dL — AB (ref 70–99)
GLUCOSE-CAPILLARY: 187 mg/dL — AB (ref 70–99)
Glucose-Capillary: 84 mg/dL (ref 70–99)
Glucose-Capillary: 107 mg/dL — ABNORMAL HIGH (ref 70–99)

## 2014-04-27 LAB — BLOOD GAS, ARTERIAL
ACID-BASE DEFICIT: 4.7 mmol/L — AB (ref 0.0–2.0)
BICARBONATE: 20.4 meq/L (ref 20.0–24.0)
Drawn by: 36529
O2 Content: 3 L/min
O2 Saturation: 86.2 %
PATIENT TEMPERATURE: 98.6
PH ART: 7.312 — AB (ref 7.350–7.450)
TCO2: 21.7 mmol/L (ref 0–100)
pCO2 arterial: 41.7 mmHg (ref 35.0–45.0)
pO2, Arterial: 57.1 mmHg — ABNORMAL LOW (ref 80.0–100.0)

## 2014-04-27 LAB — PRO B NATRIURETIC PEPTIDE: Pro B Natriuretic peptide (BNP): 8321 pg/mL — ABNORMAL HIGH (ref 0–450)

## 2014-04-27 LAB — TROPONIN I: Troponin I: <0.3 ng/mL (ref <0.30)

## 2014-04-27 MED ORDER — SODIUM CHLORIDE 0.9 % IV BOLUS (SEPSIS)
500.0000 mL | Freq: Once | INTRAVENOUS | Status: AC
  Administered 2014-04-27: 500 mL via INTRAVENOUS

## 2014-04-27 MED ORDER — PREDNISONE 20 MG PO TABS
40.0000 mg | ORAL_TABLET | Freq: Every day | ORAL | Status: DC
  Administered 2014-04-27 – 2014-04-29 (×3): 40 mg via ORAL
  Filled 2014-04-27 (×6): qty 2

## 2014-04-27 NOTE — Consult Note (Signed)
Cardiology Consultation Note  Patient ID: Brendan Arroyo, MRN: [id number], DOB/AGE: [birth year] 80 y.o. Admit date: 04/26/2014   Date of Consult: 04/27/2014 Primary Physician: [name], MD Primary Cardiologist: Excell Seltzer  Chief Complaint: "felt woozy then I passed out" Reason for Consult: syncope  HPI: Brendan Arroyo is an 80 y/o M with history of COPD with chronic respiratory failure on home O2 (2L/ qhs & PRN), CAD s/p CABG 1993 & DES in 2005/2008, low risk nuc 01/2014) DM, mild renal insufficiency (1.32 on 04/10/14), mild carotid disease, HTN who presented to [facility] with an episode of syncope. He has been having infrequent episodes of dizziness and chest discomfort after he eats for the past few weeks. This is the discomfort that prompted the stress test in 01/2014 which was low risk. He has not had any exertional chest pain. Yesterday when he left his house and started to walk to his car to go to church he felt somewhat dizzy. When he got to his car and sat down this got better. When he got to church he walked towards the building and started up the steps but after 2 steps became "woozy" and his memory became fuzzy. He thinks he lost consciousness for a brief second and fell, striking his head on the concrete. This is the first time this has ever happened to him, either a fall or syncope. Bystanders gathered around to help and he was alert but still woozy. He denies any pre/post chest pain, palpitations, or significant change in his dyspnea - he was wearing O2 at the time. Per notes when EMS arrived, he was cyanotic at corners of mouth, non re-breather @ 15L applied.  SBP was 108/75 on admission, SB 54. Troponins neg x 4. pBNP 8321 in the setting of acute renal insufficiency with Cr 2.4. K was also elevated on admission at 5.4, WBC 14k. TSH wnl, UA with >specific gravity, 30 protein, hyaline casts. UDS negative. The last Cr available to [place] is in 2013 at 1.14. Hyperkalemia resolved  without intervention. CXR cardiac enlargement and chronic lung changes without acute abnormality. CT head nonacute. Carotid duplex 1-39% BICA. Tele unremarkable. EKG does not appear significantly changed. No orthostatic VS available. Lasix, HCTZ, amlodipine, and metoprolol were held on admission and BP was 102/46 this AM. He is on meds for COPD exacerbation as well. Exam reveals a systolic murmur which was not noted on prior OVs.  I called Dr. Mcneff Copa office - last BMET on 04/10/14 showed BUN 31/Cr 1.32. He states Dr. Lysbeth Galas recently had cut his Lasix in half due to this. Orthostatics pending.  Past Medical History  Diagnosis Date  . Dyslipidemia   . HTN (hypertension)   . DM (diabetes mellitus)   . Occlusion and stenosis of carotid artery without mention of cerebral infarction     a. Duplex 04/2014: suggest upper range 1-39% BICA.  Marland Kitchen CAD (coronary artery disease)     a. s/p very remote angioplasty, CABG 1993. b. H/o DES to dRCA at anastamotic site of VG 2005, DES to OM 2008. c. Low risk nuc 01/2014.  . H/O: gout   . Diverticulitis   . Colon polyps   . Blood transfusion     '93  . GERD (gastroesophageal reflux disease)   . Hyperlipidemia   . Renal insufficiency   . Stricture and stenosis of esophagus 11/06/2011    egd with esoph stricture dilation 11/2011.  [name]  . Esophageal reflux 11/06/2011  . COPD (chronic obstructive  pulmonary disease)     a. Oxygen use 2l/m nasally usually nightly and during day as needed.      Most Recent Cardiac Studies: Carotid Duplex 04/2014: suggest upper range 1-39% BICA.  Nuclear stress test 02/09/14 Overall Impression: Low risk stress nuclear study. No evidence of ischemia or scar. No new EKG changes with stress. Normal LV systolic function.  LV Ejection Fraction: 70%. LV Wall Motion: Normal Wall Motion  Cath 10/2008 DATE OF PROCEDURE: 10/16/2008  DATE OF DISCHARGE:  CARDIAC CATHETERIZATION  INDICATIONS: Mr. Cambre is a 80 year old gentleman who  presents with  some exertional chest tightness. He is well known to me in 1985. He  had an angioplasty in 1993. He underwent revascularization surgery with  a vein graft to the distal right internal mammary to the LAD diagonal  system. Subsequent to this, he has had stenting of the distal right  vein graft at its insertion into the native vessel, and he has also had  percutaneous intervention of the native circumflex a year ago. This  started out as sharp chest discomfort, somewhat atypical. His enzymes  were negative. However, he then developed what sounded like more  typical symptoms and he has been fairly reliable in the past. The  current study was done to assess coronary anatomy.  PROCEDURE:  1. Left heart catheterization.  2. Selective coronary arteriography.  3. Selective left ventriculography.  4. Saphenous vein graft angiography.  5. Selective left internal mammary angiography.  DESCRIPTION OF PROCEDURE: The patient was brought to the  Catheterization Laboratory and prepped and draped in the usual fashion  through an anterior puncture. The left femoral artery was entered. A 5-  French sheath was then placed. Following this, views of the left  coronary artery were obtained. Following this, RCA angiography was  performed. We used a right bypass catheter to engage the vein graft to  the right and an internal mammary catheter to engage the left internal  mammary. Following this, central aortic and left ventricular pressures  were measured and a hand injection was done into the LV to reduce  contrast load. There were no complications. I compared this to the old  studies in careful detail and I elected to recommend medical treatment.  HEMODYNAMIC DATA:  1. The central aortic pressure is 152/61, mean 93.  2. Left ventricular pressure 164/5.  3. There was no gradient on pullback across aortic valve.  ANGIOGRAPHIC DATA:  1. The left main is free of critical disease.  2. The left  anterior descending artery is totally occluded after a  small diagonal.  3. The internal mammary to the diagonal and distal LAD appears to be  intact, with good runoff into both branches. There is disease  between the 2 branches.  4. The native circumflex is calcified. There is a first marginal that  has about 40% ostial narrowing, but is fairly small in caliber.  The AV circumflex after the takeoff of the second marginal is  previously stented and there is no evidence of any significant  restenosis at this site. The second marginal itself has an  eccentric plaque of about 60% noted in the midportion of that  artery. It has been previously visualized, and compared to  previous study, I do not observe a substantial change. Distally,  the AV circumflex has about 50% eccentric bend leading into the  fairly large third marginal branch.  5. The right coronary artery has a 50-60% mid stenosis. The vessel is  then diffusely  diseased distally, and there is competitive filling.  6. The vein graft at its inserts into the distal right covering the PD  and PLA system appears to be widely patent. The stent itself is  patent as well. There is maybe 40% in the continuation branch.  7. The ventriculogram by hand only suggests preserved LV function.  CONCLUSION:  1. Preserved overall left ventricular function.  2. Continued patency of the internal mammary to the diagonal LAD.  3. Continued patency of the saphenous vein graft to the distal right  with continued patency of the stent at its insertion.  4. Continued patency of the stent to the mid circumflex.  5. Moderate lesions of the OM-2 and distal AV circumflex as described  above.  DISPOSITION: I have carefully reviewed the old films and compared them  to the current studies. The circumflex OM lesions do not appear to be  substantially progressed. It potentially could be the source of  ischemia, but does not appear to be unstable as such, given the  current  anatomy I am inclined to treat him medically at the present time. We  will see how he does first and I will see him back in followup in the  office soon.   Surgical History:  Past Surgical History  Procedure Laterality Date  . Cabg  1993  . Coronary stent placement  July 2005, 2008    taxus stent of the distal right coronary artery   . Elbow surgery      right  . Tonsillectomy    . Back surgery    . Esophagogastroduodenoscopy  07/31/2012    Procedure: ESOPHAGOGASTRODUODENOSCOPY (EGD);  Surgeon: Meryl Dare, MD,FACG;  Location: [facility] ENDOSCOPY;  Service: Endoscopy;  Laterality: N/A;  pt has food impaction, takes plavix, so no plans to dilate.  . Coronary artery bypass graft    . Esophagogastroduodenoscopy N/A 01/07/2014    Procedure: ESOPHAGOGASTRODUODENOSCOPY (EGD);  Surgeon: Louis Meckel, MD;  Location: [facility] ENDOSCOPY;  Service: Endoscopy;  Laterality: N/A;  . Balloon dilation N/A 01/07/2014    Procedure: BALLOON DILATION;  Surgeon: Louis Meckel, MD;  Location: [facility] ENDOSCOPY;  Service: Endoscopy;  Laterality: N/A;  . Esophagogastroduodenoscopy N/A 03/09/2014    Procedure: ESOPHAGOGASTRODUODENOSCOPY (EGD);  Surgeon: Louis Meckel, MD;  Location: [facility] ENDOSCOPY;  Service: Endoscopy;  Laterality: N/A;  . Balloon dilation N/A 03/09/2014    Procedure: BALLOON DILATION;  Surgeon: Louis Meckel, MD;  Location: [facility] ENDOSCOPY;  Service: Endoscopy;  Laterality: N/A;     Home Meds: Prior to Admission medications   Medication Sig Start Date End Date Taking? Authorizing Provider  amLODipine (NORVASC) 5 MG tablet Take 2.5-5 mg by mouth 2 (two) times daily. Take 5mg  in the am and 2.5mg  in the pm   Yes Historical Provider, MD  atorvastatin (LIPITOR) 20 MG tablet Take 20 mg by mouth every morning.    Yes Historical Provider, MD  budesonide-formoterol (SYMBICORT) 160-4.5 MCG/ACT inhaler Inhale 2 puffs into the lungs 2 (two) times daily.   Yes Historical Provider, MD  clopidogrel (PLAVIX) 75 MG  tablet Take 1 tablet (75 mg total) by mouth every morning. 03/09/14  Yes Louis Meckel, MD  Cyanocobalamin (VITAMIN B-12 IJ) Inject 1,000 mcg as directed every 30 (thirty) days.   Yes Historical Provider, MD  fluticasone (FLONASE) 50 MCG/ACT nasal spray Place 2 sprays into both nostrils daily as needed for allergies.    Yes Historical Provider, MD  furosemide (LASIX) 40 MG tablet Take 20  mg by mouth every morning.    Yes Historical Provider, MD  gabapentin (NEURONTIN) 300 MG capsule Take 300 mg by mouth daily.   Yes Historical Provider, MD  glipiZIDE (GLUCOTROL XL) 5 MG 24 hr tablet Take 5 mg by mouth daily with breakfast.   Yes Historical Provider, MD  guaifenesin (HUMIBID E) 400 MG TABS tablet Take 400 mg by mouth 2 (two) times daily.    Yes Historical Provider, MD  hydrochlorothiazide 25 MG tablet Take 25 mg by mouth every morning.    Yes Historical Provider, MD  HYDROcodone-acetaminophen (NORCO/VICODIN) 5-325 MG per tablet Take 0.5-1 tablets by mouth daily as needed for moderate pain.   Yes Historical Provider, MD  hydroxypropyl methylcellulose (ISOPTO TEARS) 2.5 % ophthalmic solution Place 1 drop into both eyes 3 (three) times daily as needed for dry eyes.   Yes Historical Provider, MD  metFORMIN (GLUCOPHAGE-XR) 500 MG 24 hr tablet Take 500 mg by mouth 2 (two) times daily.   Yes Historical Provider, MD  metoprolol (LOPRESSOR) 50 MG tablet Take 50 mg by mouth 2 (two) times daily.     Yes Historical Provider, MD  Multiple Vitamins-Minerals ([employer] SPECTRAVITE SENIOR) TABS Take 1 tablet by mouth daily.     Yes Historical Provider, MD  nitroGLYCERIN (NITROSTAT) 0.4 MG SL tablet Place 1 tablet (0.4 mg total) under the tongue every 5 (five) minutes as needed. For chest pain 01/26/14  Yes Tonny Bollman, MD  tiotropium [facility]) 18 MCG inhalation capsule Place 18 mcg into inhaler and inhale daily.   Yes Historical Provider, MD    Inpatient Medications:  . atorvastatin  20 mg Oral q morning - 10a  .  benzonatate  200 mg Oral BID  . budesonide-formoterol  2 puff Inhalation BID  . clopidogrel  75 mg Oral q morning - 10a  . dextromethorphan-guaiFENesin  1 tablet Oral BID  . heparin  5,000 Units Subcutaneous 3 times per day  . insulin aspart  0-15 Units Subcutaneous TID WC  . levofloxacin  750 mg Oral Q48H  . predniSONE  40 mg Oral Q breakfast  . sodium chloride  3 mL Intravenous Q12H  . tiotropium  18 mcg Inhalation Daily      Allergies: No Known Allergies  History   Social History  . Marital Status: Widowed    Spouse Name: N/A    Number of Children: 1  . Years of Education: N/A   Occupational History  . retired    Social History Main Topics  . Smoking status: Former Games developer  . Smokeless tobacco: Never Used     Comment: Quit 2011?. has a 40 pack-year history   . Alcohol Use: 2.4 oz/week    4 Shots of liquor per week     Comment: social -occ.  . Drug Use: No  . Sexual Activity: Not on file   Other Topics Concern  . Not on file   Social History Narrative   Lives in [place] and is retired. Tries to eat a heart health diet and exercises regularly.      Family History  Problem Relation Age of Onset  . Ovarian cancer Mother   . Diabetes Mother   . Diabetes Brother   . Heart disease Maternal Grandfather   . Colon cancer Neg Hx   . Esophageal cancer Neg Hx   . Stomach cancer Neg Hx      Review of Systems: No orthopnea, LEE. Urinating normally. No recent fevers. All other systems reviewed and are otherwise  negative except as noted above.  Labs:  Recent Labs  04/26/14 1700 04/26/14 2200 04/27/14 0320  TROPONINI <0.30 <0.30 <0.30   Lab Results  Component Value Date   WBC 11.2* 04/26/2014   HGB 13.5 04/26/2014   HCT 40.8 04/26/2014   MCV 92.1 04/26/2014   PLT 182 04/26/2014    Recent Labs Lab 04/26/14 1345 04/26/14 1700  NA 140  --   K 5.4*  --   CL 100  --   CO2 22  --   BUN 45*  --   CREATININE 2.47* 2.48*  CALCIUM 9.0  --   PROT 6.6  --     BILITOT 1.1  --   ALKPHOS 102  --   ALT 38  --   AST 36  --   GLUCOSE 73  --    Radiology/Studies:  Dg Chest 2 View  04/26/2014   CLINICAL DATA:  Syncope, weakness, dizziness and cough.  EXAM: CHEST  2 VIEW  COMPARISON:  12/05/2012  FINDINGS: The heart is enlarged but stable. There are surgical changes from bypass surgery. There is tortuosity and calcification of the thoracic aorta. There are chronic bronchitic type interstitial lung changes but no definite acute overlying infiltrates or effusions.  IMPRESSION: Cardiac enlargement and chronic lung changes but no acute overlying pulmonary findings.   Electronically Signed   By: Loralie ChampagneMark  Gallerani M.D.   On: 04/26/2014 15:31   Ct Head Wo Contrast  04/26/2014   CLINICAL DATA:  80 year old male with dizziness, loss of consciousness, fall and head injury.  EXAM: CT HEAD WITHOUT CONTRAST  TECHNIQUE: Contiguous axial images were obtained from the base of the skull through the vertex without intravenous contrast.  COMPARISON:  10/15/2008 head CT  FINDINGS: Mild generalized cerebral volume loss is noted.  No acute intracranial abnormalities are identified, including mass lesion or mass effect, hydrocephalus, extra-axial fluid collection, midline shift, hemorrhage, or acute infarction.  The visualized bony calvarium is unremarkable.  IMPRESSION: No evidence of acute intracranial abnormality.   Electronically Signed   By: Laveda AbbeJeff  Hu M.D.   On: 04/26/2014 13:34   EKG: SB 56bpm, diffuse TWI II, III, avF, V2-V5 with minimal ST depression, septal infarct age undetermined - unchanged from 12/2013  Physical Exam: Blood pressure 102/46, pulse 57, temperature 97.7 F (36.5 C), temperature source Oral, resp. rate 16, SpO2 94.00%. General: Well developed, well nourished elderly WM in no acute distress. Laying flat in bed. Head: Normocephalic, atraumatic, sclera non-icteric, no xanthomas, nares are without discharge. Mild cyanosis of lips. Face becomes bright red when  trying to do minimal activity. Neck: Negative for carotid bruits. JVD not elevated. Lungs: Clear bilaterally to auscultation without rales or rhonchi. Occasional expiratory wheezing. Breathing is unlabored. Heart: RRR with S1 S2. 2/6 SEM mid sternal region. No rubs or gallops appreciated. Abdomen: Soft, non-tender, non-distended with normoactive bowel sounds. No hepatomegaly. No rebound/guarding. No obvious abdominal masses. Msk:  Strength and tone appear normal for age. Extremities: No clubbing or cyanosis. No edema.  Distal pedal pulses are 1+ and equal bilaterally. Neuro: Alert and oriented X 3. No facial asymmetry. No focal deficit. Moves all extremities spontaneously. Psych:  Responds to questions appropriately with a normal affect.   Assessment and Plan:   1. Syncope 2. Acute on CKD stage III - OP Cr 1.32 on 7/10 with suspected dehydration -> 2.48 3. COPD with chronic respiratory failure on QHS/PRN home O2 4. CAD s/p CABG 1993, DES 2005/2008 with atypical chest discomfort with recent  low risk nuc 01/2014 5. Mild carotid disease bilaterally 6. HTN with blood pressure on the softer side 7. DM, controlled A1C 6.6  With concentrated urine, soft BP, and AKI (1.32 two weeks ago at PCP -> 2.4-2.8 here), suspect combination of dehydration and hypotension contributed to syncope. His poor lung function/hypoxia also may have predisposed him to this. Agree with continuing IV hydration for now and holding antihypertensives. pBNP is elevated but nondiagnostic in the setting of moderately elevated creatinine. He has no LEE/orthopnea laying in bed right now and CXR without edema. If BP stable tomorrow and Cr improving, would consider adding back beta blocker - changing to bisoprolol may be a good idea with severe COPD. Await echo results. Chest discomfort sounds somewhat atypical and with no recent exertional component, will continue to monitor given recent normal nuc. If EF is down however we will need to  reconsider ischemic workup keeping in mind AKI. He will need reassessment of chronic O2 needs prior to discharge. Continue Plavix and atorvastatin for now. Can consider outpatient event monitor for periodic dizzy spells if tele unrevealing. F/u orthostatics.  Signed, Ronie Spies PA-C 04/27/2014, 3:25 PM  I have personally seen and examined this patient with Ronie Spies, PA-C. I agree with the assessment and plan as outlined above. He appears to be hypovolemic. With hypotension and renal insufficiency agree with holding diuretics, anti-hypertensives. Follow renal function closely after gentle hydration. Carotids with minimal disease. Will follow with echo results. May also be a component of hypoxia here with underlying COPD, O2 dependence. Pt is followed by Pulmonary in [place] but wishes to establish with pulmonologist here in [place].   MCALHANY,CHRISTOPHER 04/27/2014 4:31 PM

## 2014-04-27 NOTE — H&P (Addendum)
INTERNAL MEDICINE TEACHING ATTENDING NOTE  Day 1 of stay  Patient name: Brendan Arroyo  MRN: [id number] Date of birth: Jun 03, 1929   80 y.o.man with copd on 2L of home oxygen (uses it at night, has been using it increasingly at home), CAD s/p CABG in 1993 (cardiac cath in 01/2011 w/ stenting of RCA), DM2, carotid stenosis (carotid duplex on 08/21/12 with 40-59% stenosis in ICAs bilaterally), and HTN comes in with syncope. Addionally, had been feeling short of breath more than usual since days, increased cough and sputum, no orthopnea, no PND. On presentation apparently, he was not able to finish sentences due to shortness of breath, which he is able to do today when I see him. He denies any chest pain, palpitations, nausea, vomiting, diarrhea, abdominal pain.   On exam, he is wearing a nasal canula, in no distress, sitting comfortably in bed. His cardiac exam shows regular heart sounds, possible diastolic murmur, with carotid bruit on left side. He has mild pitting edema in bilateral legs, pulses present. Lungs moving air, clear to auscultation. Abdomen benign.   I have reviewed the patient's medical history in detail and updated the computerized patient record.  Assessment and Plan                                                                       Syncope and shortness of breath - Likely explanation for increased shortness of breath could be COPD exacerbation, however we will also do a pro-BNP considering history of CAD with CABG and stents placed.  The patient had a myoview in May by [facility] which was read as low risk. For COPD exacerbation, I agree with levofloxacin, but would add a short course steroids as well. Ambulatory sats to be done. The patient appears clinically improved.   Addendum BNP done - increased to 8321, although in the setting of CKD and advanced age. I would like to obtain cardiology advice, given bilateral carotid stenosis, symptomatic CAD and high BNP suggesting  CHF. Will add 2D echo to assess right hea. Lungs don't look congested on CXR, and breathing has improved since admission. He does have pedal edema. Would not give fluids. Will consider start lasix - but await cardiology input - this could also be due elevated right heart pressures due to cor pulmonale.    Syncope could be vasovagal due to exertion secondary to SOB, or secondary to hypoperfusion from carotid stenosis. Agree with carotid dopplers.   I have seen and evaluated this patient and discussed it with my IM resident team.  Please see the rest of the plan per resident note from today.   Shaton Lore 04/27/2014, 11:33 AM.

## 2014-04-27 NOTE — Progress Notes (Signed)
Utilization review completed.  

## 2014-04-27 NOTE — Progress Notes (Signed)
Subjective: Patient has no complaints this AM. He denies CP, palpitations, diaphoresis. He does have SOB but no more than baseline. Pt seems to breathing more comfortably compared to yesterday. Pt to have carotid doppler and echo today.  Objective: Vital signs in last 24 hours: Filed Vitals:   04/26/14 1400 04/26/14 1635 04/26/14 2100 04/27/14 0531  BP: 127/37 108/60 107/65 102/46  Pulse: 57 62 58 57  Temp:  97.3 F (36.3 C) 97.8 F (36.6 C) 97.7 F (36.5 C)  TempSrc:  Oral  Oral  Resp: 15 20 16 16   SpO2: 95% 90% 90% 94%   Weight change:   Intake/Output Summary (Last 24 hours) at 04/27/14 1140 Last data filed at 04/27/14 0600  Gross per 24 hour  Intake 2423.33 ml  Output    200 ml  Net 2223.33 ml   Physical Exam General: alert, cooperative, pleasant, NAD  HEENT: [place], 5 cm hematoma on right side, back of head. EOMI, PERRL, mucus membranes dry  Neck: supple, no JVD  CV: RRR, normal S1/S2, no m/g/r, no carotid bruits  Pulm: CTA bilaterally, breathing comfortably on 2 L O2 via [place].  Abd: BS+, soft, nondistended, nontender  Ext: warm, moves all, no edema Neuro: alert and oriented x 3, CNs II- XII intact  Lab Results: Basic Metabolic Panel:  Recent Labs Lab 04/26/14 1241 04/26/14 1345 04/26/14 1700  NA 139 140  --   K 5.4* 5.4*  --   CL 107 100  --   CO2  --  22  --   GLUCOSE 102* 73  --   BUN 43* 45*  --   CREATININE 2.80* 2.47* 2.48*  CALCIUM  --  9.0  --    Liver Function Tests:  Recent Labs Lab 04/26/14 1345  AST 36  ALT 38  ALKPHOS 102  BILITOT 1.1  PROT 6.6  ALBUMIN 4.1   No results found for this basename: LIPASE, AMYLASE,  in the last 168 hours No results found for this basename: AMMONIA,  in the last 168 hours CBC:  Recent Labs Lab 04/26/14 1345 04/26/14 1700  WBC 14.3* 11.2*  HGB 13.6 13.5  HCT 41.3 40.8  MCV 90.8 92.1  PLT 185 182   Cardiac Enzymes:  Recent Labs Lab 04/26/14 1700 04/26/14 2200 04/27/14 0320  TROPONINI <0.30  <0.30 <0.30   BNP:  Recent Labs Lab 04/27/14 0830  PROBNP 8321.0*   D-Dimer: No results found for this basename: DDIMER,  in the last 168 hours CBG:  Recent Labs Lab 04/26/14 1223 04/26/14 1626 04/26/14 1811 04/26/14 2123 04/27/14 0738  GLUCAP 97 69* 88 129* 84   Hemoglobin A1C: No results found for this basename: HGBA1C,  in the last 168 hours Fasting Lipid Panel: No results found for this basename: CHOL, HDL, LDLCALC, TRIG, CHOLHDL, LDLDIRECT,  in the last 168 hours Thyroid Function Tests:  Recent Labs Lab 04/26/14 1700  TSH 3.240   Coagulation: No results found for this basename: LABPROT, INR,  in the last 168 hours Anemia Panel: No results found for this basename: VITAMINB12, FOLATE, FERRITIN, TIBC, IRON, RETICCTPCT,  in the last 168 hours Urine Drug Screen: Drugs of Abuse     Component Value Date/Time   LABOPIA NONE DETECTED 04/26/2014 1533   COCAINSCRNUR NONE DETECTED 04/26/2014 1533   LABBENZ NONE DETECTED 04/26/2014 1533   AMPHETMU NONE DETECTED 04/26/2014 1533   THCU NONE DETECTED 04/26/2014 1533   LABBARB NONE DETECTED 04/26/2014 1533    Alcohol Level: No results  found for this basename: ETH,  in the last 168 hours Urinalysis:  Recent Labs Lab 04/26/14 1533  COLORURINE YELLOW  LABSPEC >1.030*  PHURINE 5.0  GLUCOSEU NEGATIVE  HGBUR NEGATIVE  BILIRUBINUR NEGATIVE  KETONESUR NEGATIVE  PROTEINUR 30*  UROBILINOGEN 0.2  NITRITE NEGATIVE  LEUKOCYTESUR NEGATIVE    Micro Results: No results found for this or any previous visit (from the past 240 hour(s)). Studies/Results: Dg Chest 2 View  04/26/2014   CLINICAL DATA:  Syncope, weakness, dizziness and cough.  EXAM: CHEST  2 VIEW  COMPARISON:  12/05/2012  FINDINGS: The heart is enlarged but stable. There are surgical changes from bypass surgery. There is tortuosity and calcification of the thoracic aorta. There are chronic bronchitic type interstitial lung changes but no definite acute overlying  infiltrates or effusions.  IMPRESSION: Cardiac enlargement and chronic lung changes but no acute overlying pulmonary findings.   Electronically Signed   By: Loralie Champagne M.D.   On: 04/26/2014 15:31   Ct Head Wo Contrast  04/26/2014   CLINICAL DATA:  80 year old male with dizziness, loss of consciousness, fall and head injury.  EXAM: CT HEAD WITHOUT CONTRAST  TECHNIQUE: Contiguous axial images were obtained from the base of the skull through the vertex without intravenous contrast.  COMPARISON:  10/15/2008 head CT  FINDINGS: Mild generalized cerebral volume loss is noted.  No acute intracranial abnormalities are identified, including mass lesion or mass effect, hydrocephalus, extra-axial fluid collection, midline shift, hemorrhage, or acute infarction.  The visualized bony calvarium is unremarkable.  IMPRESSION: No evidence of acute intracranial abnormality.   Electronically Signed   By: Laveda Abbe M.D.   On: 04/26/2014 13:34   Medications: I have reviewed the patient's current medications. Scheduled Meds: . atorvastatin  20 mg Oral q morning - 10a  . benzonatate  200 mg Oral BID  . budesonide-formoterol  2 puff Inhalation BID  . clopidogrel  75 mg Oral q morning - 10a  . dextromethorphan-guaiFENesin  1 tablet Oral BID  . heparin  5,000 Units Subcutaneous 3 times per day  . insulin aspart  0-15 Units Subcutaneous TID WC  . levofloxacin  750 mg Oral Q48H  . predniSONE  40 mg Oral Q breakfast  . sodium chloride  3 mL Intravenous Q12H  . tiotropium  18 mcg Inhalation Daily   Continuous Infusions:  PRN Meds:.fluticasone, HYDROcodone-acetaminophen, ipratropium-albuterol, ondansetron (ZOFRAN) IV, ondansetron, polyvinyl alcohol Assessment/Plan: Brendan Arroyo is a 80yo man w/ PMHx of COPD (on 2L O2 via [place] at home), CAD s/p CABG in 1993 (cardiac cath in 01/2011 w/ stenting of RCA), DM type 2 (last HbA1c 6.8 on 01/29/14), carotid stenosis (carotid duplex on 08/21/12 showed 40-59% stenosis in ICAs  bilaterally), and HTN who presented to the ED with syncope.   1. Syncope: Pt had a syncopal episode while going up the stairs. Pt denies palpitations, tunnel vision, or black spots before passing out. Pt reports he does remember falling and hitting his head on the concrete. CT scan done in ED is negative for acute bleed. Patient does have extensive cardiac hx with CABG in 1993 and stent placement in RCA in 2012. He had a carotid doppler in 08/2012 which showed 40-59% bilateral ICA stenosis. Pt also has hx of COPD and is on 2L O2 via [place] at home. He reports increased dyspnea with exertion and frequent dry cough recently. On exam, patient is SOB at baseline. He has a large hematoma on the back of his head (right side) from hitting  his head. No carotid bruits were heard. Troponins negative x 3. CXR shows cardiomegaly and chronic lung changes. BNP 8321. ABG shows pH 3.12/pCO2 41/ pO2 57/Bicarb 20/O2 86%. ABG concerning that patient is not saturating well even with 2L O2 via [place]. Pulse oximetry shows patient saturating 90-95% with 2L O2 via [place]. Based on this information, patient likely had syncope 2/2 lack of perfusion. His severe COPD and mild carotid stenosis are likely contributing factors. Possible COPD exacerbation vs. Heart failure vs. PE (less likely since Wells score low and Geneva score 1, patient does not have tachycardia). - f/u Carotid doppler  - Echocardiogram ordered  - Cardiology consulted - Cardiac monitoring  - f/u Orthostatic VS  - NS @ 100 ml/hr  - Spiriva 18 mcg daily  - Duonebs Q4H  - Levofloxacin 750 mg PO Q48H - Prednisone 40 mg PO daily - O2 therapy, pulse ox   2. COPD Exacerbation?: Patient has hx of COPD and uses home O2. Pt reports increased dyspnea with exertion and increased dry cough (see above). He takes Symbicort and Spiriva at home.  - Continue home O2 via [place]  - Pulse oximetry  - Monitor VS   3. CAD s/p CABG in 1993 (Cardiac cath in 01/2011 w/ Stenting of RCA): Pt takes  Atorvastatin 20 mg daily, Plavix 75 mg daily, Amlodipine 5 mg BID, Lasix 40 mg daily, HCTZ 25 mg daily, and Metoprolol 50 mg BID at home.  - Hold BP meds for now  - Continue Atorvastatin, Plavix  - Cardiac monitoring   4. DM Type 2: Last HbA1c 6.8 on 01/29/14. Blood glucose 97 on admission. Pt on Metformin 500 mg BID and Gabapentin 300 mg daily at home.  - Moderate ISS  - CBGs 4 times daily   5. HTN: BP 109/75 on admission. Pt takes Amlodipine 5 mg BID, Lasix 40 mg daily, HCTZ 25 mg daily, and Metoprolol 50 mg BID at home.  - Hold BP meds for now since hypotensive on admission  - Monitor VS     The patient does have a current PCP Joette Catching(Leonard Nyland, MD) and [facility] after discharge.  The patient does not have transportation limitations that hinder transportation to clinic appointments.  .Services Needed at time of discharge: Y = Yes, Blank = No PT:   OT:   RN:   Equipment:   Other:     LOS: 1 day   Brendan Numberarly Rivet, MD 04/27/2014, 11:40 AM

## 2014-04-27 NOTE — Progress Notes (Signed)
*  PRELIMINARY RESULTS* Vascular Ultrasound Carotid Duplex (Doppler) has been completed.   Findings suggest upper range 1-39% internal carotid artery stenosis bilaterally. Vertebral arteries are patent with antegrade flow.  04/27/2014 2:53 PM Gertie FeyMichelle Vernis [place], RVT, RDCS, RDMS

## 2014-04-27 NOTE — Progress Notes (Signed)
  Echocardiogram 2D Echocardiogram has been performed.  Brendan Arroyo, Brendan Arroyo 04/27/2014, 1:59 PM

## 2014-04-28 DIAGNOSIS — R55 Syncope and collapse: Secondary | ICD-10-CM | POA: Diagnosis not present

## 2014-04-28 DIAGNOSIS — R404 Transient alteration of awareness: Secondary | ICD-10-CM | POA: Diagnosis not present

## 2014-04-28 DIAGNOSIS — N289 Disorder of kidney and ureter, unspecified: Secondary | ICD-10-CM | POA: Diagnosis not present

## 2014-04-28 DIAGNOSIS — E875 Hyperkalemia: Secondary | ICD-10-CM

## 2014-04-28 DIAGNOSIS — E860 Dehydration: Secondary | ICD-10-CM | POA: Diagnosis not present

## 2014-04-28 DIAGNOSIS — I779 Disorder of arteries and arterioles, unspecified: Secondary | ICD-10-CM

## 2014-04-28 LAB — BASIC METABOLIC PANEL
ANION GAP: 13 (ref 5–15)
Anion gap: 15 (ref 5–15)
Anion gap: 15 (ref 5–15)
BUN: 52 mg/dL — ABNORMAL HIGH (ref 6–23)
BUN: 49 mg/dL — AB (ref 6–23)
BUN: 46 mg/dL — AB (ref 6–23)
CO2: 20 mEq/L (ref 19–32)
CO2: 19 meq/L (ref 19–32)
CO2: 17 meq/L — AB (ref 19–32)
CREATININE: 2.11 mg/dL — AB (ref 0.50–1.35)
Calcium: 8.9 mg/dL (ref 8.4–10.5)
Calcium: 8.9 mg/dL (ref 8.4–10.5)
Calcium: 9 mg/dL (ref 8.4–10.5)
Chloride: 101 mEq/L (ref 96–112)
Chloride: 100 mEq/L (ref 96–112)
Chloride: 103 mEq/L (ref 96–112)
Creatinine, Ser: 2.46 mg/dL — ABNORMAL HIGH (ref 0.50–1.35)
Creatinine, Ser: 1.94 mg/dL — ABNORMAL HIGH (ref 0.50–1.35)
GFR calc Af Amer: 26 mL/min — ABNORMAL LOW (ref >90)
GFR calc Af Amer: 35 mL/min — ABNORMAL LOW (ref >90)
GFR calc non Af Amer: 23 mL/min — ABNORMAL LOW (ref >90)
GFR calc non Af Amer: 27 mL/min — ABNORMAL LOW (ref >90)
GFR calc non Af Amer: 30 mL/min — ABNORMAL LOW (ref >90)
GFR, EST AFRICAN AMERICAN: 32 mL/min — AB (ref >90)
Glucose, Bld: 214 mg/dL — ABNORMAL HIGH (ref 70–99)
Glucose, Bld: 195 mg/dL — ABNORMAL HIGH (ref 70–99)
Glucose, Bld: 241 mg/dL — ABNORMAL HIGH (ref 70–99)
Potassium: 6.2 mEq/L — ABNORMAL HIGH (ref 3.7–5.3)
Potassium: 6.3 mEq/L — ABNORMAL HIGH (ref 3.7–5.3)
Potassium: 5.7 mEq/L — ABNORMAL HIGH (ref 3.7–5.3)
SODIUM: 134 meq/L — AB (ref 137–147)
Sodium: 134 mEq/L — ABNORMAL LOW (ref 137–147)
Sodium: 135 mEq/L — ABNORMAL LOW (ref 137–147)

## 2014-04-28 LAB — GLUCOSE, CAPILLARY
GLUCOSE-CAPILLARY: 241 mg/dL — AB (ref 70–99)
Glucose-Capillary: 181 mg/dL — ABNORMAL HIGH (ref 70–99)
Glucose-Capillary: 230 mg/dL — ABNORMAL HIGH (ref 70–99)
Glucose-Capillary: 254 mg/dL — ABNORMAL HIGH (ref 70–99)

## 2014-04-28 MED ORDER — ZOLPIDEM TARTRATE 5 MG PO TABS
5.0000 mg | ORAL_TABLET | Freq: Once | ORAL | Status: AC
  Administered 2014-04-28: 5 mg via ORAL
  Filled 2014-04-28: qty 1

## 2014-04-28 MED ORDER — INSULIN ASPART 100 UNIT/ML ~~LOC~~ SOLN
0.0000 [IU] | Freq: Every day | SUBCUTANEOUS | Status: DC
  Administered 2014-04-28: 2 [IU] via SUBCUTANEOUS

## 2014-04-28 MED ORDER — INSULIN ASPART 100 UNIT/ML ~~LOC~~ SOLN
0.0000 [IU] | Freq: Three times a day (TID) | SUBCUTANEOUS | Status: DC
Start: 2014-04-29 — End: 2014-04-29

## 2014-04-28 MED ORDER — SODIUM CHLORIDE 0.9 % IV BOLUS (SEPSIS)
1000.0000 mL | Freq: Once | INTRAVENOUS | Status: AC
  Administered 2014-04-28: 1000 mL via INTRAVENOUS

## 2014-04-28 MED ORDER — SODIUM POLYSTYRENE SULFONATE 15 GM/60ML PO SUSP
30.0000 g | Freq: Once | ORAL | Status: AC
  Administered 2014-04-28: 30 g via ORAL
  Filled 2014-04-28: qty 120

## 2014-04-28 NOTE — Progress Notes (Signed)
Subjective: Patient reports that he is still not feeling well, some nausea this AM. He denied Duoneb treatment yesterday. I talked with patient this morning and told him that he should try the Duonebs to help his breathing, he agreed. Also discussed that he will need to use his oxygen at all times. Echocardiogram and carotid doppler were normal yesterday. This AM, K was elevated at 6.2. Repeat K 6.3. EKG was performed that showed no peaked T waves, widening of QRS, or prolonged PR. Patient was given Kayexalate 30 g. Patient denies CP, palpitations, muscle cramps, SOB greater than baseline.  Objective: Vital signs in last 24 hours: Filed Vitals:   04/26/14 2100 04/27/14 0531 04/27/14 2025 04/28/14 0415  BP: 107/65 102/46 131/62 126/71  Pulse: 58 57 89 86  Temp: 97.8 F (36.6 C) 97.7 F (36.5 C) 97.5 F (36.4 C) 97.5 F (36.4 C)  TempSrc:  Oral Oral Oral  Resp: 16 16 18 18   Weight:    203 lb 8 oz (92.307 kg)  SpO2: 90% 94% 91% 90%   Weight change:   Intake/Output Summary (Last 24 hours) at 04/28/14 0826 Last data filed at 04/28/14 0425  Gross per 24 hour  Intake      0 ml  Output    325 ml  Net   -325 ml    Physical Exam  General: alert, cooperative, pleasant, NAD  HEENT: [place], 5 cm hematoma on right side, back of head. EOMI, PERRL, mucus membranes dry  Neck: supple, no JVD  CV: RRR, normal S1/S2, 2/6 systolic murmur, no carotid bruits  Pulm: CTA bilaterally, breathing comfortably on 2 L O2 via [place].  Abd: BS+, soft, nondistended, nontender  Ext: warm, moves all, no edema  Neuro: alert and oriented x 3, CNs II- XII intact  Lab Results: Basic Metabolic Panel:  Recent Labs Lab 04/28/14 0337 04/28/14 0713  NA 134* 134*  K 6.2* 6.3*  CL 101 100  CO2 20 19  GLUCOSE 214* 195*  BUN 52* 49*  CREATININE 2.46* 2.11*  CALCIUM 8.9 8.9   Liver Function Tests:  Recent Labs Lab 04/26/14 1345  AST 36  ALT 38  ALKPHOS 102  BILITOT 1.1  PROT 6.6  ALBUMIN 4.1   No results  found for this basename: LIPASE, AMYLASE,  in the last 168 hours No results found for this basename: AMMONIA,  in the last 168 hours CBC:  Recent Labs Lab 04/26/14 1345 04/26/14 1700  WBC 14.3* 11.2*  HGB 13.6 13.5  HCT 41.3 40.8  MCV 90.8 92.1  PLT 185 182   Cardiac Enzymes:  Recent Labs Lab 04/26/14 1700 04/26/14 2200 04/27/14 0320  TROPONINI <0.30 <0.30 <0.30   BNP:  Recent Labs Lab 04/27/14 0830  PROBNP 8321.0*   D-Dimer: No results found for this basename: DDIMER,  in the last 168 hours CBG:  Recent Labs Lab 04/26/14 2123 04/27/14 0738 04/27/14 1136 04/27/14 1637 04/27/14 2041 04/28/14 0738  GLUCAP 129* 84 107* 149* 187* 181*   Hemoglobin A1C:  Recent Labs Lab 04/26/14 1700  HGBA1C 6.6*   Fasting Lipid Panel: No results found for this basename: CHOL, HDL, LDLCALC, TRIG, CHOLHDL, LDLDIRECT,  in the last 168 hours Thyroid Function Tests:  Recent Labs Lab 04/26/14 1700  TSH 3.240   Coagulation: No results found for this basename: LABPROT, INR,  in the last 168 hours Anemia Panel: No results found for this basename: VITAMINB12, FOLATE, FERRITIN, TIBC, IRON, RETICCTPCT,  in the last 168 hours Urine  Drug Screen: Drugs of Abuse     Component Value Date/Time   LABOPIA NONE DETECTED 04/26/2014 1533   COCAINSCRNUR NONE DETECTED 04/26/2014 1533   LABBENZ NONE DETECTED 04/26/2014 1533   AMPHETMU NONE DETECTED 04/26/2014 1533   THCU NONE DETECTED 04/26/2014 1533   LABBARB NONE DETECTED 04/26/2014 1533    Alcohol Level: No results found for this basename: ETH,  in the last 168 hours Urinalysis:  Recent Labs Lab 04/26/14 1533  COLORURINE YELLOW  LABSPEC >1.030*  PHURINE 5.0  GLUCOSEU NEGATIVE  HGBUR NEGATIVE  BILIRUBINUR NEGATIVE  KETONESUR NEGATIVE  PROTEINUR 30*  UROBILINOGEN 0.2  NITRITE NEGATIVE  LEUKOCYTESUR NEGATIVE     Micro Results: No results found for this or any previous visit (from the past 240  hour(s)). Studies/Results: Dg Chest 2 View  04/26/2014   CLINICAL DATA:  Syncope, weakness, dizziness and cough.  EXAM: CHEST  2 VIEW  COMPARISON:  12/05/2012  FINDINGS: The heart is enlarged but stable. There are surgical changes from bypass surgery. There is tortuosity and calcification of the thoracic aorta. There are chronic bronchitic type interstitial lung changes but no definite acute overlying infiltrates or effusions.  IMPRESSION: Cardiac enlargement and chronic lung changes but no acute overlying pulmonary findings.   Electronically Signed   By: Loralie ChampagneMark  Gallerani M.D.   On: 04/26/2014 15:31   Ct Head Wo Contrast  04/26/2014   CLINICAL DATA:  80 year old male with dizziness, loss of consciousness, fall and head injury.  EXAM: CT HEAD WITHOUT CONTRAST  TECHNIQUE: Contiguous axial images were obtained from the base of the skull through the vertex without intravenous contrast.  COMPARISON:  10/15/2008 head CT  FINDINGS: Mild generalized cerebral volume loss is noted.  No acute intracranial abnormalities are identified, including mass lesion or mass effect, hydrocephalus, extra-axial fluid collection, midline shift, hemorrhage, or acute infarction.  The visualized bony calvarium is unremarkable.  IMPRESSION: No evidence of acute intracranial abnormality.   Electronically Signed   By: Laveda AbbeJeff  Hu M.D.   On: 04/26/2014 13:34   Medications: I have reviewed the patient's current medications. Scheduled Meds: . atorvastatin  20 mg Oral q morning - 10a  . benzonatate  200 mg Oral BID  . budesonide-formoterol  2 puff Inhalation BID  . clopidogrel  75 mg Oral q morning - 10a  . dextromethorphan-guaiFENesin  1 tablet Oral BID  . heparin  5,000 Units Subcutaneous 3 times per day  . insulin aspart  0-15 Units Subcutaneous TID WC  . levofloxacin  750 mg Oral Q48H  . predniSONE  40 mg Oral Q breakfast  . sodium chloride  3 mL Intravenous Q12H  . sodium polystyrene  30 g Oral Once  . tiotropium  18 mcg  Inhalation Daily   Continuous Infusions:  PRN Meds:.fluticasone, HYDROcodone-acetaminophen, ipratropium-albuterol, ondansetron (ZOFRAN) IV, ondansetron, polyvinyl alcohol Assessment/Plan: Brendan Arroyo is a 80yo man w/ PMHx of COPD (on 2L O2 via [place] at home), CAD s/p CABG in 1993 (cardiac cath in 01/2011 w/ stenting of RCA), DM type 2 (last HbA1c 6.8 on 01/29/14), carotid stenosis (carotid duplex on 08/21/12 showed 40-59% stenosis in ICAs bilaterally), and HTN who presented to the ED with syncope.   1. Syncope likely 2/2 orthostatic hypotension: Pt had a syncopal episode while going up the stairs. Pt denies palpitations, tunnel vision, or black spots before passing out. Pt reports he does remember falling and hitting his head on the concrete. CT scan done in ED is negative for acute bleed. Patient does have  extensive cardiac hx with CABG in 1993 and stent placement in RCA in 2012. He had a carotid doppler in 08/2012 which showed 40-59% bilateral ICA stenosis. Pt also has hx of COPD and is on 2L O2 via [place] at home. He reports increased dyspnea with exertion and frequent dry cough recently. On exam, patient is SOB at baseline. He has a large hematoma on the back of his head (right side) from hitting his head. No carotid bruits were heard. Troponins negative x 3. CXR shows cardiomegaly and chronic lung changes. BNP 8321. ABG shows pH 3.12/pCO2 41/ pO2 57/Bicarb 20/O2 86%. ABG concerning that patient is not saturating well even with 2L O2 via [place]. Pulse oximetry shows patient saturating 90-95% with 2L O2 via [place]. Based on this information, patient likely had syncope 2/2 lack of perfusion. His severe COPD and mild carotid stenosis are likely contributing factors. Possible COPD exacerbation vs. Heart failure vs. PE (less likely since Wells score low and Geneva score 1, patient does not have tachycardia). Cardiology was consulted and believe syncope related to dehydration and hypotension. Cards recommends continuing IVF and  holding antihypertensives/diuretics, and if Cr improves and BP stable can add back beta blocker (bisoprolol recommended). Carotid doppler showed 1-39% ICA stenosis bilaterally. Echocardiogram showed EF 60-65%, no wall motion abnormalities, right ventricular systolic pressure (101 mmHg) consistent with severe pulmonary hypertension. Orthostatics borderline negative. BP has been in 100s-130s/60-70s. Will continue to hydrate.  - Cardiac monitoring  - NS @ 100 ml/hr  - Spiriva 18 mcg daily  - Duonebs Q4H  - Levofloxacin 750 mg PO Q48H  - Prednisone 40 mg PO daily  - O2 therapy, pulse ox   2. Hyperkalemia: K 5.4 on admission. This AM 6.2. Repeat K 6.3. EKG performed that showed no peaked T waves, widened QRS, or PR prolongation. Kayexalate 30 g was given. Patient asymptomatic. - f/u Repeat BMET @ 12pm  3. COPD Exacerbation?: Patient has hx of COPD and uses home O2. Pt reports increased dyspnea with exertion and increased cough with yellow sputum production see above). He takes Symbicort and Spiriva at home.  - Continue home O2 via [place]  - Pulse oximetry  - Monitor VS   4. CAD s/p CABG in 1993 (Cardiac cath in 01/2011 w/ Stenting of RCA): Pt takes Atorvastatin 20 mg daily, Plavix 75 mg daily, Amlodipine 5 mg BID, Lasix 40 mg daily, HCTZ 25 mg daily, and Metoprolol 50 mg BID at home. BP has been on lower side. - Hold BP meds for now  - Continue Atorvastatin, Plavix  - Cardiac monitoring   5. DM Type 2: Last HbA1c 6.8 on 01/29/14. Blood glucose 97 on admission. Pt on Metformin 500 mg BID and Gabapentin 300 mg daily at home.  - Moderate ISS  - CBGs 4 times daily   6. HTN: BP 109/75 on admission. Pt takes Amlodipine 5 mg BID, Lasix 40 mg daily, HCTZ 25 mg daily, and Metoprolol 50 mg BID at home.  - Hold BP meds for now since continues to be hypotensive - Continue IVF - Monitor VS    Dispo: Disposition is deferred at this time, awaiting improvement of current medical problems.  Anticipated  discharge in approximately 1-2 day(s).   The patient does have a current PCP Joette Catching, MD) and does need an [facility] hospital follow-up appointment after discharge.  The patient does not have transportation limitations that hinder transportation to clinic appointments.  .Services Needed at time of discharge: Y = Yes, Blank =  No PT:   OT:   RN:   Equipment:   Other:     LOS: 2 days   Rich Number, MD 04/28/2014, 8:26 AM

## 2014-04-28 NOTE — Progress Notes (Signed)
  Inpatient Diabetes Program Recommendations  AACE/ADA: New Consensus Statement on Inpatient Glycemic Control (2013)  Target Ranges:  Prepandial:   less than 140 mg/dL      Peak postprandial:   less than 180 mg/dL (1-2 hours)      Critically ill patients:  140 - 180 mg/dL  Results for Brendan Arroyo, [name] (MRN [id number]) as of 04/28/2014 14:43  Ref. Range 04/27/2014 11:36 04/27/2014 16:37 04/27/2014 20:41 04/28/2014 07:38 04/28/2014 11:30  Glucose-Capillary Latest Range: 70-99 mg/dL 829107 (H) 562149 (H) 130187 (H) 181 (H) 230 (H)     Reason for Visit: elevated BG after starting prednisone.   Diabetes history: Type 2 - A1C 6.6% Outpatient Diabetes medications: Metformin 500mg  bid, Glucotrol 5mg /day Current orders for Inpatient glycemic control: Novolog correction (moderate) 0-15units with meals, Metformin 500mg  bid  Recommend D/C Metformin based on age and Creat 1.9 . Consider adding 3 units novolog pre-meals if eating >50%.   Susette RacerJulie Ashiya Kinkead, RN, BA, MHA, CDE Diabetes Coordinator Inpatient Diabetes Program  [phone] (Team Pager) [phone] Patrcia Dolly([place] Office) 04/28/2014 2:46 PM

## 2014-04-28 NOTE — Progress Notes (Signed)
  PROGRESS NOTE MEDICINE TEACHING ATTENDING   Day 2 of stay Patient name: Brendan Arroyo   Medical record number: [id number] Date of birth: 05-Apr-1929    Does not feel well. Complains of nausea, and shortness of breath while sitting in bed. Is willing to try nebulizers today. Vitals stable. On exam, alert, cooperative, cardiac exam - systolic murmur. Lungs - no rales, moving air. Noted hyperkalemia and improving creatinine on labs.  I have reviewed Dr Rivet's note and I agree with her exam findings, assessment and plan. I have discussed the care of this patient with my IM team residents. Please see the resident note for details.  Rut Betterton 04/28/2014, 11:21 AM.

## 2014-04-28 NOTE — Progress Notes (Signed)
SUBJECTIVE: Nausea. No SOB or chest pain. No dizziness.   BP 137/78  Pulse 79  Temp(Src) 97.5 F (36.4 C) (Oral)  Resp 18  Wt 203 lb 8 oz (92.307 kg)  SpO2 94%  Intake/Output Summary (Last 24 hours) at 04/28/14 1229 Last data filed at 04/28/14 0425  Gross per 24 hour  Intake      0 ml  Output    325 ml  Net   -325 ml   PHYSICAL EXAM General: Well developed, well nourished, in no acute distress. Alert and oriented x 3.  Psych:  Good affect, responds appropriately Neck: No JVD. No masses noted.  Lungs: Clear bilaterally with no wheezes or rhonci noted.  Heart: RRR with no murmurs noted. Abdomen: Bowel sounds are present. Soft, non-tender.  Extremities: No lower extremity edema.   LABS: Basic Metabolic Panel:  Recent Labs  16/07/9606/28/15 0337 04/28/14 0713  NA 134* 134*  K 6.2* 6.3*  CL 101 100  CO2 20 19  GLUCOSE 214* 195*  BUN 52* 49*  CREATININE 2.46* 2.11*  CALCIUM 8.9 8.9   CBC:  Recent Labs  04/26/14 1345 04/26/14 1700  WBC 14.3* 11.2*  HGB 13.6 13.5  HCT 41.3 40.8  MCV 90.8 92.1  PLT 185 182   Cardiac Enzymes:  Recent Labs  04/26/14 1700 04/26/14 2200 04/27/14 0320  TROPONINI <0.30 <0.30 <0.30   Current Meds: . atorvastatin  20 mg Oral q morning - 10a  . benzonatate  200 mg Oral BID  . budesonide-formoterol  2 puff Inhalation BID  . clopidogrel  75 mg Oral q morning - 10a  . dextromethorphan-guaiFENesin  1 tablet Oral BID  . heparin  5,000 Units Subcutaneous 3 times per day  . insulin aspart  0-15 Units Subcutaneous TID WC  . levofloxacin  750 mg Oral Q48H  . predniSONE  40 mg Oral Q breakfast  . sodium chloride  3 mL Intravenous Q12H  . tiotropium  18 mcg Inhalation Daily   Echo 04/27/14: Left ventricle: The cavity size was normal. There was mild focal basal hypertrophy of the septum. Systolic function was normal. The estimated ejection fraction was in the range of 60% to 65%. Wall motion was normal; there were no regional wall  motion abnormalities. There was an increased relative contribution of atrial contraction to ventricular filling. Doppler parameters are consistent with abnormal left ventricular relaxation (grade 1 diastolic dysfunction). - Mitral valve: Calcified annulus. There was trivial regurgitation. - Left atrium: The atrium was mildly dilated. - Right ventricle: The cavity size was moderately dilated. Wall thickness was normal. Systolic function was severely reduced. - Right atrium: The atrium was severely dilated. - Tricuspid valve: There was moderate regurgitation. - Pulmonic valve: There was trivial regurgitation. - Pulmonary arteries: PA peak pressure: 101 mm Hg (S).  Impressions:  - The right ventricular systolic pressure was increased consistent with severe pulmonary hypertension.   ASSESSMENT AND PLAN:  1. Syncope: unclear etiology. Acute renal failure on admission and labs c/w dehydration. Syncope likely due to orthostatic hypotension. Carotids with mild disease. No LV wall motion abnormalitie. Diuretics and anti-hypertensives on hold.   2. Acute on CKD stage III : Improving  3. COPD with chronic respiratory failure on QHS/PRN home O2   4. CAD s/p CABG 1993, DES 2005/2008 with atypical chest discomfort with recent low risk nuc 01/2014. Cardiac markers negative.   5. Mild carotid disease bilaterally   6. HTN: He was hypotensive on admission. Now BP better  holding anti-hypertensives.   7. Severe pulmonary HTN: likely secondary to underlying lung disease  8. Hyperkalemia: addressed by primary team     Brendan Arroyo  7/28/201512:29 PM

## 2014-04-28 NOTE — Progress Notes (Signed)
MD on call noitfied of pt's cbg being in the 200s. New orders for bedtime sliding scale. Will continue to monitor the pt. Sanda LingerMilam, Advika Mclelland R

## 2014-04-28 NOTE — Progress Notes (Signed)
Utilization review completed.  

## 2014-04-28 NOTE — Progress Notes (Signed)
Please consider adding carb modified to current heart healthy diet.   Susette RacerJulie Brynnleigh Mcelwee, RN, BA, MHA, CDE Diabetes Coordinator Inpatient Diabetes [facility]  [phone] (Team Pager) [phone] Patrcia Dolly([place] Office) 04/28/2014 2:53 PM

## 2014-04-29 DIAGNOSIS — I951 Orthostatic hypotension: Secondary | ICD-10-CM

## 2014-04-29 DIAGNOSIS — N179 Acute kidney failure, unspecified: Secondary | ICD-10-CM

## 2014-04-29 DIAGNOSIS — J441 Chronic obstructive pulmonary disease with (acute) exacerbation: Secondary | ICD-10-CM

## 2014-04-29 DIAGNOSIS — R55 Syncope and collapse: Secondary | ICD-10-CM | POA: Diagnosis not present

## 2014-04-29 LAB — BASIC METABOLIC PANEL
Anion gap: 14 (ref 5–15)
BUN: 44 mg/dL — ABNORMAL HIGH (ref 6–23)
CO2: 22 mEq/L (ref 19–32)
Calcium: 9 mg/dL (ref 8.4–10.5)
Chloride: 102 mEq/L (ref 96–112)
Creatinine, Ser: 1.76 mg/dL — ABNORMAL HIGH (ref 0.50–1.35)
GFR, EST AFRICAN AMERICAN: 39 mL/min — AB (ref >90)
GFR, EST NON AFRICAN AMERICAN: 34 mL/min — AB (ref >90)
Glucose, Bld: 94 mg/dL (ref 70–99)
POTASSIUM: 5.1 meq/L (ref 3.7–5.3)
Sodium: 138 mEq/L (ref 137–147)

## 2014-04-29 LAB — GLUCOSE, CAPILLARY
Glucose-Capillary: 80 mg/dL (ref 70–99)
Glucose-Capillary: 107 mg/dL — ABNORMAL HIGH (ref 70–99)

## 2014-04-29 MED ORDER — BISOPROLOL FUMARATE 5 MG PO TABS: 2.5000 mg | ORAL_TABLET | Freq: Every day | ORAL | Status: DC

## 2014-04-29 MED ORDER — DM-GUAIFENESIN ER 30-600 MG PO TB12: 1.0000 | ORAL_TABLET | Freq: Two times a day (BID) | ORAL | Status: DC

## 2014-04-29 MED ORDER — METOPROLOL TARTRATE 25 MG PO TABS
25.0000 mg | ORAL_TABLET | Freq: Two times a day (BID) | ORAL | Status: DC
  Administered 2014-04-29: 25 mg via ORAL
  Filled 2014-04-29 (×2): qty 1

## 2014-04-29 NOTE — Discharge Summary (Signed)
Name: Brendan Arroyo MRN: [id number] DOB: 1929-08-26 80 y.o. PCP: Joette Catching, MD  Date of Admission: 04/26/2014 12:05 PM Date of Discharge: 04/29/2014 Attending Physician: No att. providers found  Discharge Diagnosis: 1. Syncope 2/2 Orthostatic Hypotension 2. Pulmonary Hypertension 3. Hyperkalemia- Resolved 4. COPD Exacerbation 5. CAD s/p CABG in 1993 (DES 2005/2008) 6. DM Type 2 7. HTN   Discharge Medications:   Medication List    STOP taking these medications       metoprolol 50 MG tablet  Commonly known as:  LOPRESSOR      TAKE these medications       amLODipine 5 MG tablet  Commonly known as:  NORVASC  Take 2.5-5 mg by mouth 2 (two) times daily. Take 5mg  in the am and 2.5mg  in the pm     atorvastatin 20 MG tablet  Commonly known as:  LIPITOR  Take 20 mg by mouth every morning.     bisoprolol 5 MG tablet  Commonly known as:  ZEBETA  Take 0.5 tablets (2.5 mg total) by mouth daily.     budesonide-formoterol 160-4.5 MCG/ACT inhaler  Commonly known as:  SYMBICORT  Inhale 2 puffs into the lungs 2 (two) times daily.     clopidogrel 75 MG tablet  Commonly known as:  PLAVIX  Take 1 tablet (75 mg total) by mouth every morning.     [employer] SPECTRAVITE SENIOR Tabs  Take 1 tablet by mouth daily.     dextromethorphan-guaiFENesin 30-600 MG per 12 hr tablet  Commonly known as:  MUCINEX DM  Take 1 tablet by mouth 2 (two) times daily.     fluticasone 50 MCG/ACT nasal spray  Commonly known as:  FLONASE  Place 2 sprays into both nostrils daily as needed for allergies.     furosemide 40 MG tablet  Commonly known as:  LASIX  Take 20 mg by mouth every morning.     gabapentin 300 MG capsule  Commonly known as:  NEURONTIN  Take 300 mg by mouth daily.     glipiZIDE 5 MG 24 hr tablet  Commonly known as:  GLUCOTROL XL  Take 5 mg by mouth daily with breakfast.     guaifenesin 400 MG Tabs tablet  Commonly known as:  HUMIBID E  Take 400 mg by mouth 2 (two) times  daily.     hydrochlorothiazide 25 MG tablet  Commonly known as:  HYDRODIURIL  Take 25 mg by mouth every morning.     HYDROcodone-acetaminophen 5-325 MG per tablet  Commonly known as:  NORCO/VICODIN  Take 0.5-1 tablets by mouth daily as needed for moderate pain.     hydroxypropyl methylcellulose 2.5 % ophthalmic solution  Commonly known as:  ISOPTO TEARS  Place 1 drop into both eyes 3 (three) times daily as needed for dry eyes.     metFORMIN 500 MG 24 hr tablet  Commonly known as:  GLUCOPHAGE-XR  Take 500 mg by mouth 2 (two) times daily.     nitroGLYCERIN 0.4 MG SL tablet  Commonly known as:  NITROSTAT  Place 1 tablet (0.4 mg total) under the tongue every 5 (five) minutes as needed. For chest pain     tiotropium 18 MCG inhalation capsule  Commonly known as:  SPIRIVA  Place 18 mcg into inhaler and inhale daily.     VITAMIN B-12 IJ  Inject 1,000 mcg as directed every 30 (thirty) days.        Disposition and follow-up:   Mr.Bernhard W Alsip was discharged  from [facility] in Good condition.  At the hospital follow up visit please address:  1.  Blood pressure- pt started on Bisoprolol 2.5 mg daily.   2.  Labs / imaging needed at time of follow-up: None  3.  Pending labs/ test needing follow-up: None  Follow-up Appointments:   Discharge Instructions: Discharge Instructions   Diet - low sodium heart healthy    Complete by:  As directed      Increase activity slowly    Complete by:  As directed            Consultations: Treatment Team:  Rounding Lbcardiology, MD  Procedures Performed:  Dg Chest 2 View  04/26/2014   CLINICAL DATA:  Syncope, weakness, dizziness and cough.  EXAM: CHEST  2 VIEW  COMPARISON:  12/05/2012  FINDINGS: The heart is enlarged but stable. There are surgical changes from bypass surgery. There is tortuosity and calcification of the thoracic aorta. There are chronic bronchitic type interstitial lung changes but no definite acute  overlying infiltrates or effusions.  IMPRESSION: Cardiac enlargement and chronic lung changes but no acute overlying pulmonary findings.   Electronically Signed   By: Loralie Champagne M.D.   On: 04/26/2014 15:31   Dg Elbow Complete Right  04/28/2014   CLINICAL DATA:  Fall, posterior elbow pain and bruising  EXAM: RIGHT ELBOW - COMPLETE 3+ VIEW  COMPARISON:  None.  FINDINGS: Four views of the right elbow submitted. No acute fracture or subluxation. Soft tissue swelling noted dorsal elbow region. Small joint effusion.  IMPRESSION: No acute fracture or subluxation. Soft tissue swelling dorsal elbow. Small joint effusion.   Electronically Signed   By: Natasha Mead M.D.   On: 04/28/2014 08:26   Ct Head Wo Contrast  04/26/2014   CLINICAL DATA:  80 year old male with dizziness, loss of consciousness, fall and head injury.  EXAM: CT HEAD WITHOUT CONTRAST  TECHNIQUE: Contiguous axial images were obtained from the base of the skull through the vertex without intravenous contrast.  COMPARISON:  10/15/2008 head CT  FINDINGS: Mild generalized cerebral volume loss is noted.  No acute intracranial abnormalities are identified, including mass lesion or mass effect, hydrocephalus, extra-axial fluid collection, midline shift, hemorrhage, or acute infarction.  The visualized bony calvarium is unremarkable.  IMPRESSION: No evidence of acute intracranial abnormality.   Electronically Signed   By: Laveda Abbe M.D.   On: 04/26/2014 13:34    2D Echo 04/27/14:  Study Conclusions - Left ventricle: The cavity size was normal. There was mild focal basal hypertrophy of the septum. Systolic function was normal. The estimated ejection fraction was in the range of 60% to 65%. Wall motion was normal; there were no regional wall motion abnormalities. There was an increased relative contribution of atrial contraction to ventricular filling. Doppler parameters are consistent with abnormal left ventricular relaxation (grade 1 diastolic  dysfunction). - Mitral valve: Calcified annulus. There was trivial regurgitation. - Left atrium: The atrium was mildly dilated. - Right ventricle: The cavity size was moderately dilated. Wall thickness was normal. Systolic function was severely reduced. - Right atrium: The atrium was severely dilated. - Tricuspid valve: There was moderate regurgitation. - Pulmonic valve: There was trivial regurgitation. - Pulmonary arteries: PA peak pressure: 101 mm Hg (S).  Impressions: - The right ventricular systolic pressure was increased consistent with severe pulmonary hypertension.   Admission HPI: Mr. Wyss is a 80yo man w/ PMHx of COPD (on 2L O2 via [place] at home), CAD s/p CABG  in 1993 (cardiac cath in 01/2011 w/ stenting of RCA), DM type 2 (last HbA1c 6.8 on 01/29/14), carotid stenosis (carotid duplex on 08/21/12 showed 40-59% stenosis in ICAs bilaterally), and HTN who presented to the ED with syncope. Patient reports he felt "woozy" this AM while eating breakfast and doing morning chores. While going up the steps to church, he recalls grabbing the arm rail and then suddenly losing consciousness. He denies tunnel vision or black spots before passing out. Patient states he must of been out for only a few seconds because he remembers falling backward and hitting his head on the concrete. He denies palpitations or chest pain before or after the syncopal event. Fall was witnessed by his family member and he had no seizure-like movements or loss of bladder or bowel control.   His family member reports the patient is normally short of breath at baseline. He is on 2L O2 via [place] at home. He reports increased dyspnea with exertion and has difficulty completing sentences without becoming short of breath. He reports frequent nonproductive cough. He denies nausea, vomiting, abdominal pain.   In the ED, patient had a CT head that was negative for an acute process.    Hospital Course by problem list:  1. Syncope 2/2  Orthostatic Hypotension: Pt had a syncopal episode as described above. CT scan done in ED is negative for acute bleed. Patient does have extensive cardiac hx with CABG in 1993 and stent placement in RCA in 2012. He had a carotid doppler in 08/2012 which showed 40-59% bilateral ICA stenosis. Pt also has hx of COPD and is on 2L O2 via [place] at home. He reports increased dyspnea with exertion and frequent dry cough recently. On exam, patient is SOB at baseline. He has a large hematoma on the back of his head (right side) from hitting his head. No carotid bruits were heard. Troponins negative x 3. CXR shows cardiomegaly and chronic lung changes. BNP 8321. ABG shows pH 3.12/pCO2 41/ pO2 57/Bicarb 20/O2 86%. ABG concerning that patient is not saturating well even with 2L O2 via [place]. Pulse oximetry shows patient saturating 90-95% with 2L O2 via [place]. Based on this information, patient likely had syncope 2/2 lack of perfusion. His severe COPD and mild carotid stenosis are likely contributing factors. Possible COPD exacerbation vs. Heart failure vs. PE (less likely since Wells score low and Geneva score 1, patient does not have tachycardia). Cardiology was consulted and believe syncope related to dehydration and hypotension. Cards recommended continuing IVF and holding antihypertensives/diuretics, and if Cr improved and BP stable can add back beta blocker (bisoprolol recommended). Carotid doppler showed 1-39% ICA stenosis bilaterally. Echocardiogram showed EF 60-65%, no wall motion abnormalities, right ventricular systolic pressure (101 mmHg) consistent with severe pulmonary hypertension. Orthostatics borderline negative. BP now 140-150s/70s. Cr improved from 2.46>2.11>1.94>1.76 today. Will start Bisoprolol 2.5 mg, which can be titrated as necessary as outpatient, as well as home BP meds. Patient to follow-up with PCP to adjust BP meds as necessary.   2. Hyperkalemia- Resolved: K 5.4 on admission. Yesterday 6.2--> 6.3 --> -->  5.4--> 5.1 this AM. EKG yesterday showed no peaked T waves, widened QRS, or PR prolongation. Kayexalate 30 g was given yesterday. Patient asymptomatic.   3. COPD Exacerbation: Patient has hx of COPD and uses home O2. Pt reports increased dyspnea with exertion and increased cough with yellow sputum production (see above). He takes Symbicort and Spiriva at home. These medications were continued. Patient was treated for COPD exacerbation  with Spiriva, Duonebs, Levofloxacin 750 mg PO Q48hours, and Prednisone 40 mg PO daily. He was on 2-3 L O2 via [place] during his entire hospitalization.  4. CAD s/p CABG in 1993 (DES 2005/2008): Pt takes Atorvastatin 20 mg daily, Plavix 75 mg daily, Amlodipine 5 mg BID, Lasix 40 mg daily, HCTZ 25 mg daily, and Metoprolol 50 mg BID at home. BP has been on lower side. Now 140-150s/70s from holding antihypertensives. BP meds were resumed at discharge, except for Metoprolol which was switched to Bisoprolol 2.5 mg daily.  5. DM Type 2: Last HbA1c 6.8 on 01/29/14. Blood glucose 97 on admission. Pt on Metformin 500 mg BID and Gabapentin 300 mg daily at home. BGs have been ranging from 60-250. Patient was placed on moderate ISS and CBGs 4 times daily. Home meds were resumed on discharge.   6. HTN: BP 109/75 on admission. Pt takes Amlodipine 5 mg BID, Lasix 40 mg daily, HCTZ 25 mg daily, and Metoprolol 50 mg BID at home. BP meds were held due to lower pressures. BP now 140-150s/70s. Patient was restarted on home meds upon discharge, except for Metoprolol which was switched to Bisoprolol 2.5 mg daily.    Discharge Vitals:   BP 155/77  Pulse 78  Temp(Src) 97.6 F (36.4 C) (Oral)  Resp 18  Ht 5\' 10"  (1.778 m)  Wt 204 lb 9.6 oz (92.806 kg)  BMI 29.36 kg/m2  SpO2 93% Physical Exam  General: alert, cooperative, pleasant, NAD  HEENT: [place], 5 cm hematoma on right side, back of head, improving. EOMI, PERRL, mucus membranes dry  Neck: supple, no JVD  CV: RRR, normal S1/S2, 2/6 systolic  murmur, no carotid bruits  Pulm: CTA bilaterally, breathing comfortably on 3 L O2 via [place].  Abd: BS+, soft, nondistended, nontender  Ext: warm, moves all, no edema  Neuro: alert and oriented x 3, CNs II- XII intact  Discharge Labs:  Results for orders placed during the hospital encounter of 04/26/14 (from the past 24 hour(s))  GLUCOSE, CAPILLARY     Status: Abnormal   Collection Time    04/28/14  4:42 PM      Result Value Ref Range   Glucose-Capillary 254 (*) 70 - 99 mg/dL   Comment 1 Documented in Chart     Comment 2 Notify RN    GLUCOSE, CAPILLARY     Status: Abnormal   Collection Time    04/28/14  7:38 PM      Result Value Ref Range   Glucose-Capillary 241 (*) 70 - 99 mg/dL  BASIC METABOLIC PANEL     Status: Abnormal   Collection Time    04/29/14  4:40 AM      Result Value Ref Range   Sodium 138  137 - 147 mEq/L   Potassium 5.1  3.7 - 5.3 mEq/L   Chloride 102  96 - 112 mEq/L   CO2 22  19 - 32 mEq/L   Glucose, Bld 94  70 - 99 mg/dL   BUN 44 (*) 6 - 23 mg/dL   Creatinine, Ser 4.091.76 (*) 0.50 - 1.35 mg/dL   Calcium 9.0  8.4 - 81.110.5 mg/dL   GFR calc non Af Amer 34 (*) >90 mL/min   GFR calc Af Amer 39 (*) >90 mL/min   Anion gap 14  5 - 15  GLUCOSE, CAPILLARY     Status: None   Collection Time    04/29/14  7:18 AM      Result Value Ref Range  Glucose-Capillary 80  70 - 99 mg/dL  GLUCOSE, CAPILLARY     Status: Abnormal   Collection Time    04/29/14 11:25 AM      Result Value Ref Range   Glucose-Capillary 107 (*) 70 - 99 mg/dL    Signed: Rich Number, MD 04/29/2014, 1:23 PM    Services Ordered on Discharge: None Equipment Ordered on Discharge: None

## 2014-04-29 NOTE — Progress Notes (Addendum)
Subjective: No complaints, would like to go home.  Objective: Vital signs in last 24 hours: Temp:  [97.5 F (36.4 C)-97.6 F (36.4 C)] 97.6 F (36.4 C) (07/29 0554) Pulse Rate:  [78-85] 78 (07/29 0554) Resp:  [18] 18 (07/29 0554) BP: (137-155)/(77-78) 155/77 mmHg (07/29 0554) SpO2:  [93 %-96 %] 93 % (07/29 0554) Weight:  [204 lb 9.6 oz (92.806 kg)] 204 lb 9.6 oz (92.806 kg) (07/29 0554) Weight change: 1 lb 1.6 oz (0.499 kg) Last BM Date: 04/28/14 Intake/Output from previous day: +159 (+2057 since admit) wt up 1 pound 07/28 0701 - 07/29 0700 In: 360 [P.O.:360] Out: 201 [Urine:200; Stool:1] Intake/Output this shift:    PE: General:Pleasant affect, NAD Skin:Warm and dry, brisk capillary refill HEENT:normocephalic, sclera clear, mucus membranes moist Heart:S1S2 RRR without murmur, [place], rub or click Lungs:clear without rales, rhonchi, or wheezes GMW:NUUV, non tender, + BS, do not palpate liver spleen or masses Ext:1-2+ edema at ankles, 2+ radial pulses Neuro:alert and oriented, MAE, follows commands, + facial symmetry  tele:  SR in 90s, with PACs, Pauses ? Non conducted PACs, and 2-3 beats of NSVT Lab Results:  Recent Labs  04/26/14 1345 04/26/14 1700  WBC 14.3* 11.2*  HGB 13.6 13.5  HCT 41.3 40.8  PLT 185 182   BMET  Recent Labs  04/28/14 1215 04/29/14 0440  NA 135* 138  K 5.7* 5.1  CL 103 102  CO2 17* 22  GLUCOSE 241* 94  BUN 46* 44*  CREATININE 1.94* 1.76*  CALCIUM 9.0 9.0    Recent Labs  04/26/14 2200 04/27/14 0320  TROPONINI <0.30 <0.30    Lab Results  Component Value Date   HGBA1C 6.6* 04/26/2014     Lab Results  Component Value Date   TSH 3.240 04/26/2014    Hepatic Function Panel  Recent Labs  04/26/14 1345  PROT 6.6  ALBUMIN 4.1  AST 36  ALT 38  ALKPHOS 102  BILITOT 1.1     Studies/Results: Dg Elbow Complete Right  04/28/2014   CLINICAL DATA:  Fall, posterior elbow pain and bruising  EXAM: RIGHT ELBOW -  COMPLETE 3+ VIEW  COMPARISON:  None.  FINDINGS: Four views of the right elbow submitted. No acute fracture or subluxation. Soft tissue swelling noted dorsal elbow region. Small joint effusion.  IMPRESSION: No acute fracture or subluxation. Soft tissue swelling dorsal elbow. Small joint effusion.   Electronically Signed   By: Natasha Mead M.D.   On: 04/28/2014 08:26    Medications: I have reviewed the patient's current medications. Scheduled Meds: . atorvastatin  20 mg Oral q morning - 10a  . benzonatate  200 mg Oral BID  . budesonide-formoterol  2 puff Inhalation BID  . clopidogrel  75 mg Oral q morning - 10a  . dextromethorphan-guaiFENesin  1 tablet Oral BID  . heparin  5,000 Units Subcutaneous 3 times per day  . insulin aspart  0-15 Units Subcutaneous TID WC  . insulin aspart  0-5 Units Subcutaneous QHS  . levofloxacin  750 mg Oral Q48H  . predniSONE  40 mg Oral Q breakfast  . sodium chloride  3 mL Intravenous Q12H  . tiotropium  18 mcg Inhalation Daily   Continuous Infusions:  PRN Meds:.fluticasone, HYDROcodone-acetaminophen, ipratropium-albuterol, ondansetron (ZOFRAN) IV, ondansetron, polyvinyl alcohol  Assessment/Plan: 1. Syncope: unclear etiology. Acute renal failure on admission and labs c/w dehydration. Syncope likely due to orthostatic hypotension. Carotids with mild disease. No LV wall motion abnormalitie. Diuretics  and anti-hypertensives on hold. May need to add low dose diuretic back. 2. Acute on CKD stage III : Improving  3. COPD with chronic respiratory failure on QHS/PRN home O2  4. CAD s/p CABG 1993, DES 2005/2008 with atypical chest discomfort with recent low risk nuc 01/2014. Cardiac markers negative. No chest pain 5. Mild carotid disease bilaterally  6. HTN: He was hypotensive on admission. Now BP better holding anti-hypertensives. Would resume BB at 25 mg BID, lower dose but with HR in 90s and PVCs and increase of BP he needs BB. 7. Severe pulmonary HTN: likely secondary  to underlying lung disease  8. Hyperkalemia: addressed by primary team - improved  9.  DM-2  labile 10. freq PACs, HR in 90s   LOS: 3 days   Time spent with pt. :15 minutes. [facility] HeartNGOLD,LAURA R  Nurse Practitioner Certified Pager [phone] or after 5pm and on weekends call [phone] 04/29/2014, 8:46 AM   I have examined the patient and reviewed assessment and plan and discussed with patient.  Agree with above as stated.  Restart antihypertensives at low doses as noted above.   No further cardiac w/u at this time.  Call with questions.  Sherie Dobrowolski S.

## 2014-04-29 NOTE — Progress Notes (Addendum)
Subjective: Patient states he is feeling better today. He denies SOB more than baseline, CP, palpitations, nausea. K has improved to 5.1 this AM.   Objective: Vital signs in last 24 hours: Filed Vitals:   04/28/14 0953 04/28/14 1939 04/29/14 0554 04/29/14 0831  BP:  144/78 155/77   Pulse:  85 78   Temp:  97.5 F (36.4 C) 97.6 F (36.4 C)   TempSrc:  Oral Oral   Resp:  18 18   Height:    5\' 10"  (1.778 m)  Weight:   204 lb 9.6 oz (92.806 kg)   SpO2: 94% 96% 93%    Weight change: 1 lb 1.6 oz (0.499 kg)  Intake/Output Summary (Last 24 hours) at 04/29/14 0952 Last data filed at 04/29/14 0210  Gross per 24 hour  Intake    360 ml  Output    201 ml  Net    159 ml   Physical Exam General: alert, cooperative, pleasant, NAD  HEENT: [place], 5 cm hematoma on right side, back of head, improving. EOMI, PERRL, mucus membranes dry  Neck: supple, no JVD  CV: RRR, normal S1/S2, 2/6 systolic murmur, no carotid bruits  Pulm: CTA bilaterally, breathing comfortably on 3 L O2 via .  Abd: BS+, soft, nondistended, nontender  Ext: warm, moves all, no edema  Neuro: alert and oriented x 3, CNs II- XII intact  Lab Results: Basic Metabolic Panel:  Recent Labs Lab 04/28/14 1215 04/29/14 0440  NA 135* 138  K 5.7* 5.1  CL 103 102  CO2 17* 22  GLUCOSE 241* 94  BUN 46* 44*  CREATININE 1.94* 1.76*  CALCIUM 9.0 9.0   Liver Function Tests:  Recent Labs Lab 04/26/14 1345  AST 36  ALT 38  ALKPHOS 102  BILITOT 1.1  PROT 6.6  ALBUMIN 4.1   No results found for this basename: LIPASE, AMYLASE,  in the last 168 hours No results found for this basename: AMMONIA,  in the last 168 hours CBC:  Recent Labs Lab 04/26/14 1345 04/26/14 1700  WBC 14.3* 11.2*  HGB 13.6 13.5  HCT 41.3 40.8  MCV 90.8 92.1  PLT 185 182   Cardiac Enzymes:  Recent Labs Lab 04/26/14 1700 04/26/14 2200 04/27/14 0320  TROPONINI <0.30 <0.30 <0.30   BNP:  Recent Labs Lab 04/27/14 0830  PROBNP 8321.0*    D-Dimer: No results found for this basename: DDIMER,  in the last 168 hours CBG:  Recent Labs Lab 04/27/14 2041 04/28/14 0738 04/28/14 1130 04/28/14 1642 04/28/14 1938 04/29/14 0718  GLUCAP 187* 181* 230* 254* 241* 80   Hemoglobin A1C:  Recent Labs Lab 04/26/14 1700  HGBA1C 6.6*   Fasting Lipid Panel: No results found for this basename: CHOL, HDL, LDLCALC, TRIG, CHOLHDL, LDLDIRECT,  in the last 168 hours Thyroid Function Tests:  Recent Labs Lab 04/26/14 1700  TSH 3.240   Coagulation: No results found for this basename: LABPROT, INR,  in the last 168 hours Anemia Panel: No results found for this basename: VITAMINB12, FOLATE, FERRITIN, TIBC, IRON, RETICCTPCT,  in the last 168 hours Urine Drug Screen: Drugs of Abuse     Component Value Date/Time   LABOPIA NONE DETECTED 04/26/2014 1533   COCAINSCRNUR NONE DETECTED 04/26/2014 1533   LABBENZ NONE DETECTED 04/26/2014 1533   AMPHETMU NONE DETECTED 04/26/2014 1533   THCU NONE DETECTED 04/26/2014 1533   LABBARB NONE DETECTED 04/26/2014 1533    Alcohol Level: No results found for this basename: ETH,  in the last  168 hours Urinalysis:  Recent Labs Lab 04/26/14 1533  COLORURINE YELLOW  LABSPEC >1.030*  PHURINE 5.0  GLUCOSEU NEGATIVE  HGBUR NEGATIVE  BILIRUBINUR NEGATIVE  KETONESUR NEGATIVE  PROTEINUR 30*  UROBILINOGEN 0.2  NITRITE NEGATIVE  LEUKOCYTESUR NEGATIVE    Micro Results: No results found for this or any previous visit (from the past 240 hour(s)). Studies/Results: Dg Elbow Complete Right  04/28/2014   CLINICAL DATA:  Fall, posterior elbow pain and bruising  EXAM: RIGHT ELBOW - COMPLETE 3+ VIEW  COMPARISON:  None.  FINDINGS: Four views of the right elbow submitted. No acute fracture or subluxation. Soft tissue swelling noted dorsal elbow region. Small joint effusion.  IMPRESSION: No acute fracture or subluxation. Soft tissue swelling dorsal elbow. Small joint effusion.   Electronically Signed   By: Natasha MeadLiviu   Pop M.D.   On: 04/28/2014 08:26   Medications: I have reviewed the patient's current medications. Scheduled Meds: . atorvastatin  20 mg Oral q morning - 10a  . benzonatate  200 mg Oral BID  . budesonide-formoterol  2 puff Inhalation BID  . clopidogrel  75 mg Oral q morning - 10a  . dextromethorphan-guaiFENesin  1 tablet Oral BID  . heparin  5,000 Units Subcutaneous 3 times per day  . insulin aspart  0-15 Units Subcutaneous TID WC  . insulin aspart  0-5 Units Subcutaneous QHS  . levofloxacin  750 mg Oral Q48H  . metoprolol tartrate  25 mg Oral BID  . predniSONE  40 mg Oral Q breakfast  . sodium chloride  3 mL Intravenous Q12H  . tiotropium  18 mcg Inhalation Daily   Continuous Infusions:  PRN Meds:.fluticasone, HYDROcodone-acetaminophen, ipratropium-albuterol, ondansetron (ZOFRAN) IV, ondansetron, polyvinyl alcohol Assessment/Plan: [name] is a 80yo man w/ PMHx of COPD (on 2L O2 via [place] at home), CAD s/p CABG in 1993 (cardiac cath in 01/2011 w/ stenting of RCA), DM type 2 (last HbA1c 6.8 on 01/29/14), carotid stenosis (carotid duplex on 08/21/12 showed 40-59% stenosis in ICAs bilaterally), and HTN who presented to the ED with syncope.   1. Syncope likely 2/2 orthostatic hypotension: Pt had a syncopal episode while going up the stairs. Pt denies palpitations, tunnel vision, or black spots before passing out. Pt reports he does remember falling and hitting his head on the concrete. CT scan done in ED is negative for acute bleed. Patient does have extensive cardiac hx with CABG in 1993 and stent placement in RCA in 2012. He had a carotid doppler in 08/2012 which showed 40-59% bilateral ICA stenosis. Pt also has hx of COPD and is on 2L O2 via [place] at home. He reports increased dyspnea with exertion and frequent dry cough recently. On exam, patient is SOB at baseline. He has a large hematoma on the back of his head (right side) from hitting his head. No carotid bruits were heard. Troponins negative x  3. CXR shows cardiomegaly and chronic lung changes. BNP 8321. ABG shows pH 3.12/pCO2 41/ pO2 57/Bicarb 20/O2 86%. ABG concerning that patient is not saturating well even with 2L O2 via [place]. Pulse oximetry shows patient saturating 90-95% with 2L O2 via . Based on this information, patient likely had syncope 2/2 lack of perfusion. His severe COPD and mild carotid stenosis are likely contributing factors. Possible COPD exacerbation vs. Heart failure vs. PE (less likely since Wells score low and Geneva score 1, patient does not have tachycardia). Cardiology was consulted and believe syncope related to dehydration and hypotension. Cards recommends continuing IVF and holding  antihypertensives/diuretics, and if Cr improves and BP stable can add back beta blocker (bisoprolol recommended). Carotid doppler showed 1-39% ICA stenosis bilaterally. Echocardiogram showed EF 60-65%, no wall motion abnormalities, right ventricular systolic pressure (101 mmHg) consistent with severe pulmonary hypertension. Orthostatics borderline negative. BP now 140-150s/70s. Cr improved from 2.46>2.11>1.94>1.76 today. Will start on Bisoprolol today, which can be titrated as necessary as outpatient. Patient to be discharged today.  - Start Bisoprolol per cardiology recommendations - Will discharge on HCTZ, hold other antihypertensives for now until can follow-up with PCP  - Pt educated on home oxygen use. He understands that he will need to wear his oxygen most hours of the day and while sleeping. - Patient has follow-up appointment with Dr. Orson Aloe (pulmonology) next week - Cardiac monitoring  - Spiriva 18 mcg daily  - Duonebs Q4H  - Levofloxacin 750 mg PO Q48H  - Prednisone 40 mg PO daily  - O2 therapy, pulse ox   2. Hyperkalemia- Resolved: K 5.4 on admission. Yesterday 6.2--> 6.3 --> --> 5.4--> 5.1 this AM. EKG yesterday showed no peaked T waves, widened QRS, or PR prolongation. Kayexalate 30 g was given yesterday. Patient  asymptomatic.   3. COPD Exacerbation?: Patient has hx of COPD and uses home O2. Pt reports increased dyspnea with exertion and increased cough with yellow sputum production see above). He takes Symbicort and Spiriva at home.  - Continue home O2 via [place]  - Pulse oximetry  - Monitor VS   4. CAD s/p CABG in 1993 (Cardiac cath in 01/2011 w/ Stenting of RCA): Pt takes Atorvastatin 20 mg daily, Plavix 75 mg daily, Amlodipine 5 mg BID, Lasix 40 mg daily, HCTZ 25 mg daily, and Metoprolol 50 mg BID at home. BP has been on lower side. Now 140-150s/70s from holding antihypertensives.  - Will start Bisoprolol per cards - Discharge on home HCTZ - Hold other BP meds until can follow-up with PCP  - Continue Atorvastatin, Plavix  - Cardiac monitoring   5. DM Type 2: Last HbA1c 6.8 on 01/29/14. Blood glucose 97 on admission. Pt on Metformin 500 mg BID and Gabapentin 300 mg daily at home. BGs have been ranging from 60-250.  - Moderate ISS  - CBGs 4 times daily   6. HTN: BP 109/75 on admission. Pt takes Amlodipine 5 mg BID, Lasix 40 mg daily, HCTZ 25 mg daily, and Metoprolol 50 mg BID at home.  - Will switch from Metoprolol to Bisoprolol today per cards recs - Discharge on home HCTZ - Continue to hold other antihypertensives until PCP follow-up - Monitor VS    Diet: Heart healthy DVT/PE PPx:  Heparin SQ Dispo: To be discharged today.   The patient does have a current PCP Joette Catching, MD) and does need an [facility] hospital follow-up appointment after discharge.  The patient does not have transportation limitations that hinder transportation to clinic appointments.  .Services Needed at time of discharge: Y = Yes, Blank = No PT:   OT:   RN:   Equipment:   Other:     LOS: 3 days   Rich Number, MD 04/29/2014, 9:52 AM

## 2014-04-29 NOTE — Discharge Instructions (Signed)
It was a pleasure taking care of you, [name]. Please stop taking your Metoprolol medication. You will now be taking a similar medication, Bisoprolol 2.5 mg daily instead. Please follow-up with your PCP in the next 1-2 weeks so that your blood pressure medications can be adjusted if necessary. Remember to wear your oxygen as often as possible to help with your breathing.

## 2014-04-29 NOTE — Progress Notes (Signed)
  PROGRESS NOTE MEDICINE TEACHING ATTENDING   Day 3 of stay Patient name: Brendan Arroyo   Medical record number: [id number] Date of birth: November 11, 1928    Met with [name] in the morning when his girlfriend was present with him. He feels good and has no new complaints. His vitals have been stable, and he has been on his home oxygen 2l during this admission. He does not feel dizzy or lightheaded any more. I have personally examined him and his exam is unchanged from yesterday for me - I have read Dr Rivet's exam from today and I agree with it.    We can discharge him today, he has an appointment with his pulmonologist coming up next week, Dr Koleen Nimrod and I will ask my team to send out discharge summary to him.   His creatinine has decreased so resuming metformin at discharge is ok, however he would need to get labs followed up to ensure decreasing trend.   I would start HCTZ back and hold lasix, and introduce the diuretics one by one.   Severe Pulm HTN secondary to underlying lung disease - pulmonology follow up as outpatient.   Patient wanted a list of pulmonology specialists in [place]. Will ask case manager to provide.   Bisoprolol started in lieu of metoprolol as suggested by cardiology.   I have discussed the care of this patient with my IM team residents - Dr Arcelia Jew and Dr Ethelene Hal. Please see the resident note for details.  [place], [place] 04/29/2014, 12:40 PM.

## 2014-05-01 NOTE — Discharge Summary (Signed)
INTERNAL MEDICINE ATTENDING DISCHARGE COSIGN   I evaluated the patient on the day of discharge and discussed the discharge plan with my resident team. I agree with the discharge documentation and disposition.   Aletta EdouardBHARDWAJ, Rorey Hodges 05/01/2014, 12:44 PM

## 2014-05-07 ENCOUNTER — Emergency Department (HOSPITAL_COMMUNITY): Payer: Medicare Other

## 2014-05-07 ENCOUNTER — Inpatient Hospital Stay (HOSPITAL_COMMUNITY)
Admission: EM | Admit: 2014-05-07 | Discharge: 2014-05-09 | DRG: 293 | Disposition: A | Discharge status: Home / Self Care | Payer: Medicare Other | Attending: Internal Medicine | Admitting: Internal Medicine

## 2014-05-07 ENCOUNTER — Encounter (HOSPITAL_COMMUNITY): Payer: Self-pay | Admitting: Emergency Medicine

## 2014-05-07 DIAGNOSIS — E785 Hyperlipidemia, unspecified: Secondary | ICD-10-CM

## 2014-05-07 DIAGNOSIS — Z860100 Personal history of colon polyps, unspecified: Secondary | ICD-10-CM

## 2014-05-07 DIAGNOSIS — I509 Heart failure, unspecified: Principal | ICD-10-CM

## 2014-05-07 DIAGNOSIS — E119 Type 2 diabetes mellitus without complications: Secondary | ICD-10-CM

## 2014-05-07 DIAGNOSIS — I10 Essential (primary) hypertension: Secondary | ICD-10-CM | POA: Diagnosis present

## 2014-05-07 DIAGNOSIS — Z9861 Coronary angioplasty status: Secondary | ICD-10-CM

## 2014-05-07 DIAGNOSIS — Z8249 Family history of ischemic heart disease and other diseases of the circulatory system: Secondary | ICD-10-CM

## 2014-05-07 DIAGNOSIS — Z8041 Family history of malignant neoplasm of ovary: Secondary | ICD-10-CM

## 2014-05-07 DIAGNOSIS — R600 Localized edema: Secondary | ICD-10-CM

## 2014-05-07 DIAGNOSIS — E875 Hyperkalemia: Secondary | ICD-10-CM

## 2014-05-07 DIAGNOSIS — Z79899 Other long term (current) drug therapy: Secondary | ICD-10-CM

## 2014-05-07 DIAGNOSIS — N183 Chronic kidney disease, stage 3 unspecified: Secondary | ICD-10-CM

## 2014-05-07 DIAGNOSIS — K219 Gastro-esophageal reflux disease without esophagitis: Secondary | ICD-10-CM | POA: Diagnosis present

## 2014-05-07 DIAGNOSIS — I2510 Atherosclerotic heart disease of native coronary artery without angina pectoris: Secondary | ICD-10-CM | POA: Diagnosis present

## 2014-05-07 DIAGNOSIS — Z951 Presence of aortocoronary bypass graft: Secondary | ICD-10-CM

## 2014-05-07 DIAGNOSIS — I5023 Acute on chronic systolic (congestive) heart failure: Secondary | ICD-10-CM

## 2014-05-07 DIAGNOSIS — I129 Hypertensive chronic kidney disease with stage 1 through stage 4 chronic kidney disease, or unspecified chronic kidney disease: Secondary | ICD-10-CM | POA: Diagnosis present

## 2014-05-07 DIAGNOSIS — Z7902 Long term (current) use of antithrombotics/antiplatelets: Secondary | ICD-10-CM

## 2014-05-07 DIAGNOSIS — R609 Edema, unspecified: Secondary | ICD-10-CM | POA: Diagnosis not present

## 2014-05-07 DIAGNOSIS — D649 Anemia, unspecified: Secondary | ICD-10-CM | POA: Diagnosis present

## 2014-05-07 DIAGNOSIS — Z87891 Personal history of nicotine dependence: Secondary | ICD-10-CM

## 2014-05-07 DIAGNOSIS — R635 Abnormal weight gain: Secondary | ICD-10-CM

## 2014-05-07 DIAGNOSIS — J4489 Other specified chronic obstructive pulmonary disease: Secondary | ICD-10-CM | POA: Diagnosis present

## 2014-05-07 DIAGNOSIS — J449 Chronic obstructive pulmonary disease, unspecified: Secondary | ICD-10-CM | POA: Diagnosis present

## 2014-05-07 DIAGNOSIS — M109 Gout, unspecified: Secondary | ICD-10-CM | POA: Diagnosis present

## 2014-05-07 DIAGNOSIS — I2789 Other specified pulmonary heart diseases: Secondary | ICD-10-CM | POA: Diagnosis present

## 2014-05-07 DIAGNOSIS — Z833 Family history of diabetes mellitus: Secondary | ICD-10-CM

## 2014-05-07 LAB — CBC
HEMATOCRIT: 38.3 % — AB (ref 39.0–52.0)
HEMOGLOBIN: 12.6 g/dL — AB (ref 13.0–17.0)
MCH: 29.6 pg (ref 26.0–34.0)
MCHC: 32.9 g/dL (ref 30.0–36.0)
MCV: 90.1 fL (ref 78.0–100.0)
Platelets: 211 10*3/uL (ref 150–400)
RBC: 4.25 MIL/uL (ref 4.22–5.81)
RDW: 14.9 % (ref 11.5–15.5)
WBC: 10.4 10*3/uL (ref 4.0–10.5)

## 2014-05-07 MED ORDER — SODIUM CHLORIDE 0.9 % IV SOLN
INTRAVENOUS | Status: DC
  Administered 2014-05-08: 01:00:00 via INTRAVENOUS

## 2014-05-07 NOTE — ED Notes (Signed)
Pt states that he has gained 5.5lb since yesterday. Pt MD told him to come here for eval. Pt reports abdominal swelling and ankle swelling. Pt denies any increasing SOB or chest pain.

## 2014-05-08 DIAGNOSIS — N183 Chronic kidney disease, stage 3 unspecified: Secondary | ICD-10-CM

## 2014-05-08 DIAGNOSIS — R609 Edema, unspecified: Secondary | ICD-10-CM | POA: Diagnosis present

## 2014-05-08 DIAGNOSIS — Z79899 Other long term (current) drug therapy: Secondary | ICD-10-CM | POA: Diagnosis not present

## 2014-05-08 DIAGNOSIS — I25810 Atherosclerosis of coronary artery bypass graft(s) without angina pectoris: Secondary | ICD-10-CM

## 2014-05-08 DIAGNOSIS — Z8041 Family history of malignant neoplasm of ovary: Secondary | ICD-10-CM | POA: Diagnosis not present

## 2014-05-08 DIAGNOSIS — Z833 Family history of diabetes mellitus: Secondary | ICD-10-CM | POA: Diagnosis not present

## 2014-05-08 DIAGNOSIS — I509 Heart failure, unspecified: Secondary | ICD-10-CM | POA: Diagnosis present

## 2014-05-08 DIAGNOSIS — I129 Hypertensive chronic kidney disease with stage 1 through stage 4 chronic kidney disease, or unspecified chronic kidney disease: Secondary | ICD-10-CM

## 2014-05-08 DIAGNOSIS — E119 Type 2 diabetes mellitus without complications: Secondary | ICD-10-CM | POA: Diagnosis present

## 2014-05-08 DIAGNOSIS — K219 Gastro-esophageal reflux disease without esophagitis: Secondary | ICD-10-CM | POA: Diagnosis present

## 2014-05-08 DIAGNOSIS — Z7902 Long term (current) use of antithrombotics/antiplatelets: Secondary | ICD-10-CM | POA: Diagnosis not present

## 2014-05-08 DIAGNOSIS — Z8249 Family history of ischemic heart disease and other diseases of the circulatory system: Secondary | ICD-10-CM | POA: Diagnosis not present

## 2014-05-08 DIAGNOSIS — J449 Chronic obstructive pulmonary disease, unspecified: Secondary | ICD-10-CM

## 2014-05-08 DIAGNOSIS — D649 Anemia, unspecified: Secondary | ICD-10-CM | POA: Diagnosis present

## 2014-05-08 DIAGNOSIS — E875 Hyperkalemia: Secondary | ICD-10-CM | POA: Diagnosis present

## 2014-05-08 DIAGNOSIS — Z86010 Personal history of colonic polyps: Secondary | ICD-10-CM | POA: Diagnosis not present

## 2014-05-08 DIAGNOSIS — Z951 Presence of aortocoronary bypass graft: Secondary | ICD-10-CM | POA: Diagnosis not present

## 2014-05-08 DIAGNOSIS — Z87891 Personal history of nicotine dependence: Secondary | ICD-10-CM | POA: Diagnosis not present

## 2014-05-08 DIAGNOSIS — I2510 Atherosclerotic heart disease of native coronary artery without angina pectoris: Secondary | ICD-10-CM | POA: Diagnosis present

## 2014-05-08 DIAGNOSIS — M109 Gout, unspecified: Secondary | ICD-10-CM | POA: Diagnosis present

## 2014-05-08 DIAGNOSIS — I2789 Other specified pulmonary heart diseases: Secondary | ICD-10-CM | POA: Diagnosis present

## 2014-05-08 DIAGNOSIS — Z9861 Coronary angioplasty status: Secondary | ICD-10-CM | POA: Diagnosis not present

## 2014-05-08 DIAGNOSIS — E785 Hyperlipidemia, unspecified: Secondary | ICD-10-CM | POA: Diagnosis present

## 2014-05-08 LAB — PRO B NATRIURETIC PEPTIDE: PRO B NATRI PEPTIDE: 5935 pg/mL — AB (ref 0–450)

## 2014-05-08 LAB — COMPREHENSIVE METABOLIC PANEL
ALT: 39 U/L (ref 0–53)
AST: 32 U/L (ref 0–37)
Albumin: 3.8 g/dL (ref 3.5–5.2)
Alkaline Phosphatase: 133 U/L — ABNORMAL HIGH (ref 39–117)
Anion gap: 13 (ref 5–15)
BUN: 38 mg/dL — ABNORMAL HIGH (ref 6–23)
CALCIUM: 8.7 mg/dL (ref 8.4–10.5)
CO2: 22 meq/L (ref 19–32)
CREATININE: 1.42 mg/dL — AB (ref 0.50–1.35)
Chloride: 98 mEq/L (ref 96–112)
GFR calc non Af Amer: 44 mL/min — ABNORMAL LOW (ref >90)
GFR, EST AFRICAN AMERICAN: 51 mL/min — AB (ref >90)
GLUCOSE: 170 mg/dL — AB (ref 70–99)
Potassium: 5.4 mEq/L — ABNORMAL HIGH (ref 3.7–5.3)
Sodium: 133 mEq/L — ABNORMAL LOW (ref 137–147)
Total Bilirubin: 0.8 mg/dL (ref 0.3–1.2)
Total Protein: 6.4 g/dL (ref 6.0–8.3)

## 2014-05-08 LAB — BASIC METABOLIC PANEL
ANION GAP: 12 (ref 5–15)
BUN: 37 mg/dL — ABNORMAL HIGH (ref 6–23)
CALCIUM: 8.8 mg/dL (ref 8.4–10.5)
CO2: 25 mEq/L (ref 19–32)
Chloride: 101 mEq/L (ref 96–112)
Creatinine, Ser: 1.37 mg/dL — ABNORMAL HIGH (ref 0.50–1.35)
GFR calc non Af Amer: 46 mL/min — ABNORMAL LOW (ref >90)
GFR, EST AFRICAN AMERICAN: 53 mL/min — AB (ref >90)
Glucose, Bld: 138 mg/dL — ABNORMAL HIGH (ref 70–99)
Potassium: 5.4 mEq/L — ABNORMAL HIGH (ref 3.7–5.3)
SODIUM: 138 meq/L (ref 137–147)

## 2014-05-08 LAB — URINALYSIS, ROUTINE W REFLEX MICROSCOPIC
Bilirubin Urine: NEGATIVE
GLUCOSE, UA: NEGATIVE mg/dL
Hgb urine dipstick: NEGATIVE
Ketones, ur: NEGATIVE mg/dL
LEUKOCYTES UA: NEGATIVE
Nitrite: NEGATIVE
Protein, ur: NEGATIVE mg/dL
Specific Gravity, Urine: 1.008 (ref 1.005–1.030)
Urobilinogen, UA: 0.2 mg/dL (ref 0.0–1.0)
pH: 5.5 (ref 5.0–8.0)

## 2014-05-08 LAB — GLUCOSE, CAPILLARY
GLUCOSE-CAPILLARY: 132 mg/dL — AB (ref 70–99)
GLUCOSE-CAPILLARY: 172 mg/dL — AB (ref 70–99)
Glucose-Capillary: 148 mg/dL — ABNORMAL HIGH (ref 70–99)
Glucose-Capillary: 118 mg/dL — ABNORMAL HIGH (ref 70–99)

## 2014-05-08 LAB — TROPONIN I
Troponin I: <0.3 ng/mL (ref <0.30)
Troponin I: <0.3 ng/mL (ref <0.30)

## 2014-05-08 LAB — MAGNESIUM: Magnesium: 2.1 mg/dL (ref 1.5–2.5)

## 2014-05-08 MED ORDER — NITROGLYCERIN 0.4 MG SL SUBL: 0.4000 mg | SUBLINGUAL_TABLET | SUBLINGUAL | Status: DC | PRN

## 2014-05-08 MED ORDER — CLOPIDOGREL BISULFATE 75 MG PO TABS
75.0000 mg | ORAL_TABLET | Freq: Every day | ORAL | Status: DC
  Administered 2014-05-08 – 2014-05-09 (×2): 75 mg via ORAL
  Filled 2014-05-08 (×2): qty 1

## 2014-05-08 MED ORDER — GABAPENTIN 300 MG PO CAPS
300.0000 mg | ORAL_CAPSULE | Freq: Every day | ORAL | Status: DC
  Administered 2014-05-08 – 2014-05-09 (×2): 300 mg via ORAL
  Filled 2014-05-08 (×2): qty 1

## 2014-05-08 MED ORDER — FLUTICASONE PROPIONATE 50 MCG/ACT NA SUSP: 2.0000 | Freq: Every day | NASAL | Status: DC | PRN

## 2014-05-08 MED ORDER — DM-GUAIFENESIN ER 30-600 MG PO TB12
1.0000 | ORAL_TABLET | Freq: Two times a day (BID) | ORAL | Status: DC
  Administered 2014-05-08 – 2014-05-09 (×3): 1 via ORAL
  Filled 2014-05-08 (×4): qty 1

## 2014-05-08 MED ORDER — FUROSEMIDE 10 MG/ML IJ SOLN
20.0000 mg | Freq: Once | INTRAMUSCULAR | Status: AC
  Administered 2014-05-08: 20 mg via INTRAVENOUS

## 2014-05-08 MED ORDER — SODIUM POLYSTYRENE SULFONATE 15 GM/60ML PO SUSP
15.0000 g | Freq: Once | ORAL | Status: AC
  Administered 2014-05-08: 15 g via ORAL
  Filled 2014-05-08: qty 60

## 2014-05-08 MED ORDER — TIOTROPIUM BROMIDE MONOHYDRATE 18 MCG IN CAPS
18.0000 ug | ORAL_CAPSULE | Freq: Every day | RESPIRATORY_TRACT | Status: DC
  Administered 2014-05-08 – 2014-05-09 (×2): 18 ug via RESPIRATORY_TRACT
  Filled 2014-05-08: qty 5

## 2014-05-08 MED ORDER — CETYLPYRIDINIUM CHLORIDE 0.05 % MT LIQD
7.0000 mL | Freq: Two times a day (BID) | OROMUCOSAL | Status: DC
  Administered 2014-05-08 – 2014-05-09 (×2): 7 mL via OROMUCOSAL

## 2014-05-08 MED ORDER — LACTATED RINGERS IV SOLN: INTRAVENOUS | Status: DC

## 2014-05-08 MED ORDER — FENTANYL CITRATE 0.05 MG/ML IJ SOLN: 25.0000 ug | INTRAMUSCULAR | Status: DC | PRN

## 2014-05-08 MED ORDER — HYDROCODONE-ACETAMINOPHEN 5-325 MG PO TABS
0.5000 | ORAL_TABLET | Freq: Every day | ORAL | Status: DC | PRN
  Administered 2014-05-08 – 2014-05-09 (×2): 1 via ORAL
  Filled 2014-05-08 (×3): qty 1

## 2014-05-08 MED ORDER — ONDANSETRON HCL 4 MG PO TABS: 4.0000 mg | ORAL_TABLET | Freq: Four times a day (QID) | ORAL | Status: DC | PRN

## 2014-05-08 MED ORDER — AMLODIPINE BESYLATE 5 MG PO TABS
5.0000 mg | ORAL_TABLET | Freq: Two times a day (BID) | ORAL | Status: DC
  Administered 2014-05-08 – 2014-05-09 (×3): 5 mg via ORAL
  Filled 2014-05-08 (×5): qty 1

## 2014-05-08 MED ORDER — BISOPROLOL FUMARATE 5 MG PO TABS
2.5000 mg | ORAL_TABLET | Freq: Every day | ORAL | Status: DC
  Administered 2014-05-08 – 2014-05-09 (×2): 2.5 mg via ORAL
  Filled 2014-05-08 (×2): qty 0.5

## 2014-05-08 MED ORDER — SODIUM CHLORIDE 0.9 % IV SOLN
1.0000 g | Freq: Once | INTRAVENOUS | Status: AC
  Administered 2014-05-08: 1 g via INTRAVENOUS
  Filled 2014-05-08: qty 10

## 2014-05-08 MED ORDER — ATORVASTATIN CALCIUM 20 MG PO TABS
20.0000 mg | ORAL_TABLET | Freq: Every morning | ORAL | Status: DC
  Administered 2014-05-08 – 2014-05-09 (×2): 20 mg via ORAL
  Filled 2014-05-08 (×2): qty 1

## 2014-05-08 MED ORDER — SODIUM CHLORIDE 0.9 % IJ SOLN
3.0000 mL | Freq: Two times a day (BID) | INTRAMUSCULAR | Status: DC
  Administered 2014-05-08 – 2014-05-09 (×4): 3 mL via INTRAVENOUS

## 2014-05-08 MED ORDER — FUROSEMIDE 10 MG/ML IJ SOLN
40.0000 mg | Freq: Once | INTRAMUSCULAR | Status: AC
  Administered 2014-05-08: 40 mg via INTRAVENOUS
  Filled 2014-05-08: qty 4

## 2014-05-08 MED ORDER — ONDANSETRON HCL 4 MG/2ML IJ SOLN: 4.0000 mg | Freq: Four times a day (QID) | INTRAMUSCULAR | Status: DC | PRN

## 2014-05-08 MED ORDER — HYPROMELLOSE (GONIOSCOPIC) 2.5 % OP SOLN: 1.0000 [drp] | Freq: Three times a day (TID) | OPHTHALMIC | Status: DC | PRN

## 2014-05-08 MED ORDER — INSULIN ASPART 100 UNIT/ML ~~LOC~~ SOLN
0.0000 [IU] | Freq: Three times a day (TID) | SUBCUTANEOUS | Status: DC
Start: 2014-05-08 — End: 2014-05-09
  Administered 2014-05-09: 2 [IU] via SUBCUTANEOUS

## 2014-05-08 MED ORDER — INSULIN ASPART 100 UNIT/ML ~~LOC~~ SOLN
0.0000 [IU] | Freq: Three times a day (TID) | SUBCUTANEOUS | Status: DC
  Administered 2014-05-08 (×2): 2 [IU] via SUBCUTANEOUS

## 2014-05-08 MED ORDER — HYDROCHLOROTHIAZIDE 25 MG PO TABS
25.0000 mg | ORAL_TABLET | Freq: Every morning | ORAL | Status: DC
  Administered 2014-05-08 – 2014-05-09 (×2): 25 mg via ORAL
  Filled 2014-05-08 (×2): qty 1

## 2014-05-08 MED ORDER — BUDESONIDE-FORMOTEROL FUMARATE 160-4.5 MCG/ACT IN AERO
2.0000 | INHALATION_SPRAY | Freq: Two times a day (BID) | RESPIRATORY_TRACT | Status: DC
  Filled 2014-05-08: qty 6

## 2014-05-08 MED ORDER — HEPARIN SODIUM (PORCINE) 5000 UNIT/ML IJ SOLN
5000.0000 [IU] | Freq: Three times a day (TID) | INTRAMUSCULAR | Status: DC
  Administered 2014-05-08 – 2014-05-09 (×3): 5000 [IU] via SUBCUTANEOUS
  Filled 2014-05-08 (×6): qty 1

## 2014-05-08 NOTE — Progress Notes (Signed)
Subjective: [name] was seen and examined at bedside this morning with his family in the room as well. He reports feeling much better since admission in regards to improved breathing and swelling. I had a long discussion with family and him abut his volume status and tried to answer all their questions in regards to lasix dosing and monitoring weights as an outpatient.   Objective: Vital signs in last 24 hours: Filed Vitals:   05/08/14 0130 05/08/14 0330 05/08/14 0904 05/08/14 0933  BP: 139/75 148/70  108/62  Pulse: 73 84    Temp:  97.7 F (36.5 C)    TempSrc:  Oral    Resp: 14 16    Height:  5\' 10"  (1.778 m)    Weight:  208 lb 8.9 oz (94.6 kg)    SpO2: 98% 96% 95%    Weight change:   Intake/Output Summary (Last 24 hours) at 05/08/14 1330 Last data filed at 05/08/14 1100  Gross per 24 hour  Intake    670 ml  Output   2025 ml  Net  -1355 ml   Vitals reviewed. General: resting in bed, NAD HEENT: EOMI, on 2L [place] O2 Cardiac: RRR Pulm: clear to auscultation bilaterally, ?very mild crackles at right base Abd: soft, nondistended, BS present Ext: warm and well perfused, +2-3 pitting edema b/l lower extremities, r leg worse than left, extending above knee on right leg Neuro: alert and oriented X3, strength and sensation to light touch equal in bilateral upper and lower extremities, able to help himself up from the bed  Lab Results: Basic Metabolic Panel:  Recent Labs Lab 05/07/14 2334 05/08/14 0920  NA 133* 138  K 5.4* 5.4*  CL 98 101  CO2 22 25  GLUCOSE 170* 138*  BUN 38* 37*  CREATININE 1.42* 1.37*  CALCIUM 8.7 8.8  MG  --  2.1   Liver Function Tests:  Recent Labs Lab 05/07/14 2334  AST 32  ALT 39  ALKPHOS 133*  BILITOT 0.8  PROT 6.4  ALBUMIN 3.8   CBC:  Recent Labs Lab 05/07/14 2334  WBC 10.4  HGB 12.6*  HCT 38.3*  MCV 90.1  PLT 211   Cardiac Enzymes:  Recent Labs Lab 05/07/14 2334 05/08/14 0625 05/08/14 0920  TROPONINI <0.30 <0.30  <0.30   BNP:  Recent Labs Lab 05/07/14 2334  PROBNP 5935.0*   CBG:  Recent Labs Lab 05/08/14 0626 05/08/14 1122  GLUCAP 148* 132*   Urine Drug Screen: Drugs of Abuse     Component Value Date/Time   LABOPIA NONE DETECTED 04/26/2014 1533   COCAINSCRNUR NONE DETECTED 04/26/2014 1533   LABBENZ NONE DETECTED 04/26/2014 1533   AMPHETMU NONE DETECTED 04/26/2014 1533   THCU NONE DETECTED 04/26/2014 1533   LABBARB NONE DETECTED 04/26/2014 1533    Urinalysis:  Recent Labs Lab 05/08/14 0223  COLORURINE YELLOW  LABSPEC 1.008  PHURINE 5.5  GLUCOSEU NEGATIVE  HGBUR NEGATIVE  BILIRUBINUR NEGATIVE  KETONESUR NEGATIVE  PROTEINUR NEGATIVE  UROBILINOGEN 0.2  NITRITE NEGATIVE  LEUKOCYTESUR NEGATIVE   Studies/Results: Dg Chest 2 View  05/08/2014   CLINICAL DATA:  Abdominal pain and short of breath  EXAM: CHEST  2 VIEW  COMPARISON:  04/26/2014  FINDINGS: Prior CABG. Cardiac enlargement without heart failure. Negative for pneumonia. Improvement in small pleural effusions since the prior study.  COPD.  IMPRESSION: COPD with improvement in small pleural effusions.   Electronically Signed   By: Marlan Palauharles  Clark M.D.   On: 05/08/2014 00:15  Medications: I have reviewed the patient's current medications. Scheduled Meds: . amLODipine  5 mg Oral BID  . antiseptic oral rinse  7 mL Mouth Rinse BID  . atorvastatin  20 mg Oral q morning - 10a  . bisoprolol  2.5 mg Oral Daily  . budesonide-formoterol  2 puff Inhalation BID  . clopidogrel  75 mg Oral Daily  . dextromethorphan-guaiFENesin  1 tablet Oral BID  . gabapentin  300 mg Oral Daily  . heparin  5,000 Units Subcutaneous 3 times per day  . hydrochlorothiazide  25 mg Oral q morning - 10a  . insulin aspart  0-15 Units Subcutaneous TID WC  . sodium chloride  3 mL Intravenous Q12H  . tiotropium  18 mcg Inhalation Daily   Continuous Infusions: . sodium chloride Stopped (05/08/14 0402)   PRN Meds:.fluticasone, HYDROcodone-acetaminophen,  hydroxypropyl methylcellulose, nitroGLYCERIN, ondansetron (ZOFRAN) IV, ondansetron Assessment/Plan: Principal Problem:   Acute exacerbation of CHF (congestive heart failure) Active Problems:   DIABETES MELLITUS   HYPERTENSION   CKD (chronic kidney disease) stage 3, GFR 30-59 ml/min   CAD (coronary artery disease)   COPD (chronic obstructive pulmonary disease)   CHF (congestive heart failure)   Hyperkalemia   Normocytic anemia  Acute on chronic dCHF and severe pulmonary hypertension--last echo 04/2014 with grade 1 DD, abnormal LV relaxation and PA pressure .  Difficult situation given that he clearly appears volume up based on lower extremity edema, however he appears sensitive to volume removal due to symptoms and last admission for syncope 2/2 orthostatic hypotension.  Breathing currently at baseline per patient with no distress at this time. Weight of 204lb's on last discharge and up to 208lb's today (apparently 212lb's on admission). He has started diuresing well since admission after receiving total 60mg  iv lasix today, improvement of Cr from 1.42 to 1.37 today (baseline Cr ~1).  Currently net neg 1.3L thus far. Home medications from last discharge were: bisoprolol 2.5mg  daily if BP could tolerate and lasix 20mg  daily (was briefly increased to 40mg  daily with increased leg swelling prior to admission).  -continue low dose BB with bisoprolol -monitor I/O's today and will benefit from continued diuresis with IV lasix tomorrow as long as kidneys and BP can tolerate -daily weights--need to establish true dry weight -K remains slightly elevated today in setting of some renal failure but expect that to decrease with diuretics -repeat EKG--monitor t waves -AM BMET -CE x3 negative -Encourage ambulation with assistance as tolerated  CKD stage 3: Cr 1.42 and BUN 38 at admission with GFR 44. Baseline ~1 in 2013. In 04/26/14 on prior admission admitted with Cr of 2.8 and down to 1.76 on  discharge.  -improved after diuresis, will continue and closely monitor renal function -AM BMET  Hyperkalemia--mild in setting of renal failure. No peaked t waves on EKG but PR internal prolonged up to increased from prior. Expect this to improve with diuresis and as renal function improves as well.  K was also high on prior admission. Could be secondary to BB?  -given prolonged PR, will consider giving calcium -consider kayexelate if patient has not had a BM yet -trend closely  COPD--on 2L [place] at home. Stable at this time. Do not see PFTs in epic.  -continue home symbicort and spiriva -reassess oxygen requirement as outpatient  Normocytic Anemia: Hb 12.6 on admission down from 13.5 end of July. Likely dilutional 2/2 fluid status.  -continue to monitor   HTN: Normotensive today. Home medications include amlodipine, lasix, bisoprolol, and  hctz -continue home medications as tolerated  DM2: on metformin and glipizide at home. His PCP instructed him to discontinue metformin. Last Hgb A1c 6.6 on 04/26/2014  -SSI sensitive and cbg monitoring  CAD: s/p CABG 1993, PCI with stent in 01/2011.  EKG stable with persistent t wave inversions in inferior and anterolateral leads as seen on priors. CE x3 negative.  -continue bisoprolol, Plavix, and statin -Nitrostat SL 0.4mg  prn chest pain   Diet: Carb mod/HH DVT Ppx: HSQ Dispo: Disposition is deferred at this time, awaiting improvement of current medical problems.  Anticipated discharge in approximately 2-3 day(s).   The patient does have a current PCP Joette Catching, MD) and does not need an [facility] hospital follow-up appointment after discharge.  The patient does not have transportation limitations that hinder transportation to clinic appointments.  Services Needed at time of discharge: Y = Yes, Blank = No PT:   OT:   RN:   Equipment: Has cane and walker at home, requesting walker with seat and wheels  Other:     LOS: 1 day   Baltazar Apo, MD 05/08/2014, 1:30 PM

## 2014-05-08 NOTE — ED Provider Notes (Addendum)
CSN: [id number]     Arrival date & time 05/07/14  1820 History   First MD Initiated Contact with Patient 05/07/14 2312     Chief Complaint  Patient presents with  . Abdominal Pain  . Leg Swelling     (Consider location/radiation/quality/duration/timing/severity/associated sxs/prior Treatment) Patient is a 80 y.o. male presenting with abdominal pain. The history is provided by the patient and the spouse.  Abdominal Pain Associated symptoms: shortness of breath   Associated symptoms: no chest pain, no chills, no diarrhea, no fever, no sore throat and no vomiting   pt with hx dm, htn, copd, chf, c/o increased weight of 6-7 lbs in the past day. Since recent hospital d/c has been checking weights daily.  States he has been trying to drink plenty of fluids/water, as was instructed to by his doctor. Is urinating normally. +increased bil leg edema, states has been up to scrotum. + orthopnea and mild sob. Is on home o2, ?mildly worse than baseline . No chest pain or discomfort. No fever or chills. Denies recent change in meds, +compliant w meds.       Past Medical History  Diagnosis Date  . Dyslipidemia   . HTN (hypertension)   . DM (diabetes mellitus)   . Carotid artery disease     a. Duplex 04/2014: suggest upper range 1-39% BICA.  Marland Kitchen. CAD (coronary artery disease)     a. s/p very remote angioplasty, CABG 1993. b. H/o DES to dRCA at anastamotic site of VG 2005, DES to OM 2008. c. Low risk nuc 01/2014.  . H/O: gout   . Diverticulitis   . Colon polyps   . Blood transfusion     '93  . GERD (gastroesophageal reflux disease)   . Hyperlipidemia   . Renal insufficiency     a. OP Cr 04/10/14: 1.32.  Marland Kitchen. Stricture and stenosis of esophagus 11/06/2011    egd with esoph stricture dilation 11/2011.  Melvia Heapsrobert Kaplan MD  . Esophageal reflux 11/06/2011  . COPD (chronic obstructive pulmonary disease)     a. Oxygen use 2l/m nasally usually nightly and during day as needed.   Past Surgical History  Procedure  Laterality Date  . Cabg  1993  . Coronary stent placement  July 2005, 2008    taxus stent of the distal right coronary artery   . Elbow surgery      right  . Tonsillectomy    . Back surgery    . Esophagogastroduodenoscopy  07/31/2012    Procedure: ESOPHAGOGASTRODUODENOSCOPY (EGD);  Surgeon: Meryl DareMalcolm T Stark, MD,FACG;  [facility];  Service: Endoscopy;  Laterality: N/A;  pt has food impaction, takes plavix, so no plans to dilate.  . Coronary artery bypass graft    . Esophagogastroduodenoscopy N/A 01/07/2014    Procedure: ESOPHAGOGASTRODUODENOSCOPY (EGD);  Surgeon: Louis Meckelobert D Kaplan, MD;  [facility];  Service: Endoscopy;  Laterality: N/A;  . Balloon dilation N/A 01/07/2014    Procedure: BALLOON DILATION;  Surgeon: Louis Meckelobert D Kaplan, MD;  Location: [facility] ENDOSCOPY;  Service: Endoscopy;  Laterality: N/A;  . Esophagogastroduodenoscopy N/A 03/09/2014    Procedure: ESOPHAGOGASTRODUODENOSCOPY (EGD);  Surgeon: Louis Meckelobert D Kaplan, MD;  [facility];  Service: Endoscopy;  Laterality: N/A;  . Balloon dilation N/A 03/09/2014    Procedure: BALLOON DILATION;  Surgeon: Louis Meckelobert D Kaplan, MD;  Location: [facility] ENDOSCOPY;  Service: Endoscopy;  Laterality: N/A;   Family History  Problem Relation Age of Onset  . Ovarian cancer Mother   . Diabetes Mother   .  Diabetes Brother   . Heart disease Maternal Grandfather   . Colon cancer Neg Hx   . Esophageal cancer Neg Hx   . Stomach cancer Neg Hx    History  Substance Use Topics  . Smoking status: Former Games developer  . Smokeless tobacco: Never Used     Comment: Quit 2011?. has a 40 pack-year history   . Alcohol Use: 2.4 oz/week    4 Shots of liquor per week     Comment: social -occ.    Review of Systems  Constitutional: Negative for fever and chills.  HENT: Negative for sore throat.   Eyes: Negative for redness.  Respiratory: Positive for shortness of breath.   Cardiovascular: Positive for leg swelling. Negative for chest pain.   Gastrointestinal: Positive for abdominal pain. Negative for vomiting and diarrhea.  Genitourinary: Negative for flank pain.  Musculoskeletal: Negative for back pain and neck pain.  Skin: Negative for rash.  Neurological: Negative for headaches.  Hematological: Does not bruise/bleed easily.  Psychiatric/Behavioral: Negative for confusion.      Allergies  Review of patient's allergies indicates no known allergies.  Home Medications   Prior to Admission medications   Medication Sig Start Date End Date Taking? Authorizing Provider  amLODipine (NORVASC) 5 MG tablet Take 2.5-5 mg by mouth 2 (two) times daily. Take 5mg  in the am and 2.5mg  in the pm   Yes Historical Provider, MD  atorvastatin (LIPITOR) 20 MG tablet Take 20 mg by mouth every morning.    Yes Historical Provider, MD  bisoprolol (ZEBETA) 5 MG tablet Take 2.5 mg by mouth daily.   Yes Historical Provider, MD  budesonide-formoterol (SYMBICORT) 160-4.5 MCG/ACT inhaler Inhale 2 puffs into the lungs 2 (two) times daily.   Yes Historical Provider, MD  clopidogrel (PLAVIX) 75 MG tablet Take 75 mg by mouth daily.   Yes Historical Provider, MD  Cyanocobalamin (VITAMIN B-12 IJ) Inject 1,000 mcg as directed every 30 (thirty) days.   Yes Historical Provider, MD  dextromethorphan-guaiFENesin (MUCINEX DM) 30-600 MG per 12 hr tablet Take 1 tablet by mouth 2 (two) times daily.   Yes Historical Provider, MD  fluticasone (FLONASE) 50 MCG/ACT nasal spray Place 2 sprays into both nostrils daily as needed for allergies.    Yes Historical Provider, MD  furosemide (LASIX) 40 MG tablet Take 20 mg by mouth every morning.    Yes Historical Provider, MD  gabapentin (NEURONTIN) 300 MG capsule Take 300 mg by mouth daily.   Yes Historical Provider, MD  glipiZIDE (GLUCOTROL XL) 5 MG 24 hr tablet Take 5 mg by mouth daily with breakfast.   Yes Historical Provider, MD  hydrochlorothiazide 25 MG tablet Take 25 mg by mouth every morning.    Yes Historical Provider,  MD  HYDROcodone-acetaminophen (NORCO/VICODIN) 5-325 MG per tablet Take 0.5-1 tablets by mouth daily as needed for moderate pain.   Yes Historical Provider, MD  hydroxypropyl methylcellulose (ISOPTO TEARS) 2.5 % ophthalmic solution Place 1 drop into both eyes 3 (three) times daily as needed for dry eyes.   Yes Historical Provider, MD  metFORMIN (GLUCOPHAGE-XR) 500 MG 24 hr tablet Take 500 mg by mouth 2 (two) times daily.   Yes Historical Provider, MD  Multiple Vitamins-Minerals ([employer] SPECTRAVITE SENIOR) TABS Take 1 tablet by mouth daily.     Yes Historical Provider, MD  nitroGLYCERIN (NITROSTAT) 0.4 MG SL tablet Place 0.4 mg under the tongue every 5 (five) minutes as needed for chest pain.   Yes Historical Provider, MD  tiotropium [facility])  18 MCG inhalation capsule Place 18 mcg into inhaler and inhale daily.   Yes Historical Provider, MD   BP 126/62  Pulse 61  Temp(Src) 97.9 F (36.6 C) (Oral)  Resp 20  Wt 212 lb 8 oz (96.389 kg)  SpO2 91% Physical Exam  Nursing note and vitals reviewed. Constitutional: He is oriented to person, place, and time. He appears well-developed and well-nourished. No distress.  HENT:  Head: Atraumatic.  Mouth/Throat: Oropharynx is clear and moist.  Eyes: Conjunctivae are normal. No scleral icterus.  Neck: Neck supple. No tracheal deviation present.  Cardiovascular: Normal rate, regular rhythm, normal heart sounds and intact distal pulses.   Pulmonary/Chest: Effort normal and breath sounds normal. No accessory muscle usage. No respiratory distress.  Abdominal: Soft. Bowel sounds are normal. He exhibits no distension and no mass. There is no tenderness. There is no rebound and no guarding.  Genitourinary:  No cva tenderness  Musculoskeletal: Normal range of motion. He exhibits edema. He exhibits no tenderness.  +symmetric bilateral leg edema to upper thighs.   Neurological: He is alert and oriented to person, place, and time.  Skin: Skin is warm and dry.   Psychiatric: He has a normal mood and affect.    ED Course  Procedures (including critical care time) Labs Review   Results for orders placed during the hospital encounter of 05/07/14  CBC      Result Value Ref Range   WBC 10.4  4.0 - 10.5 K/uL   RBC 4.25  4.22 - 5.81 MIL/uL   Hemoglobin 12.6 (*) 13.0 - 17.0 g/dL   HCT 40.9 (*) 81.1 - 91.4 %   MCV 90.1  78.0 - 100.0 fL   MCH 29.6  26.0 - 34.0 pg   MCHC 32.9  30.0 - 36.0 g/dL   RDW 78.2  95.6 - 21.3 %   Platelets 211  150 - 400 K/uL  COMPREHENSIVE METABOLIC PANEL      Result Value Ref Range   Sodium 133 (*) 137 - 147 mEq/L   Potassium 5.4 (*) 3.7 - 5.3 mEq/L   Chloride 98  96 - 112 mEq/L   CO2 22  19 - 32 mEq/L   Glucose, Bld 170 (*) 70 - 99 mg/dL   BUN 38 (*) 6 - 23 mg/dL   Creatinine, Ser 0.86 (*) 0.50 - 1.35 mg/dL   Calcium 8.7  8.4 - 57.8 mg/dL   Total Protein 6.4  6.0 - 8.3 g/dL   Albumin 3.8  3.5 - 5.2 g/dL   AST 32  0 - 37 U/L   ALT 39  0 - 53 U/L   Alkaline Phosphatase 133 (*) 39 - 117 U/L   Total Bilirubin 0.8  0.3 - 1.2 mg/dL   GFR calc non Af Amer 44 (*) >90 mL/min   GFR calc Af Amer 51 (*) >90 mL/min   Anion gap 13  5 - 15  PRO B NATRIURETIC PEPTIDE      Result Value Ref Range   Pro B Natriuretic peptide (BNP) 5935.0 (*) 0 - 450 pg/mL  TROPONIN I      Result Value Ref Range   Troponin I <0.30  <0.30 ng/mL   Dg Chest 2 View  05/08/2014   CLINICAL DATA:  Abdominal pain and short of breath  EXAM: CHEST  2 VIEW  COMPARISON:  04/26/2014  FINDINGS: Prior CABG. Cardiac enlargement without heart failure. Negative for pneumonia. Improvement in small pleural effusions since the prior study.  COPD.  IMPRESSION: COPD with improvement in small pleural effusions.   Electronically Signed   By: Marlan Palau M.D.   On: 05/08/2014 00:15   Dg Chest 2 View  04/26/2014   CLINICAL DATA:  Syncope, weakness, dizziness and cough.  EXAM: CHEST  2 VIEW  COMPARISON:  12/05/2012  FINDINGS: The heart is enlarged but stable. There  are surgical changes from bypass surgery. There is tortuosity and calcification of the thoracic aorta. There are chronic bronchitic type interstitial lung changes but no definite acute overlying infiltrates or effusions.  IMPRESSION: Cardiac enlargement and chronic lung changes but no acute overlying pulmonary findings.   Electronically Signed   By: Loralie Champagne M.D.   On: 04/26/2014 15:31      EKG Interpretation   Date/Time:  Friday May 08 2014 00:08:53 EDT Ventricular Rate:  84 PR Interval:  230 QRS Duration: 89 QT Interval:  353 QTC Calculation: 417 R Axis:   99 Text Interpretation:  Sinus rhythm Rightward axis Prolonged PR interval  Nonspecific T wave abnormality No significant change since last tracing  Confirmed by Nikya Busler  MD, Caryn Bee ([id number]) on 05/08/2014 12:32:50 AM      MDM   Iv ns. Pulse ox. Monitor. Labs. Cxr.  Reviewed nursing notes and prior charts for additional history.   Given reported wt increase and leg edema, will give extra dose lasix in ed, lasix 40 mg iv.  Pt states w marked wt increase and increased edema, sob, does not feel comfortable going home.  Med service contacted re chf/edema.     Suzi Roots, MD 05/08/14 [phone]

## 2014-05-08 NOTE — Progress Notes (Signed)
Patient was admitted to [facility] at 0330 this morning.  Patient arrived by bed/stretcher from the ED.  Patient had no complaints and was simply hungry for a snack.  Patient arrived with wife who stayed by the bed throughout the night.  Patient is stable with vital signs within normal limits.  Will continue to monitor.

## 2014-05-08 NOTE — H&P (Signed)
  Date: 05/08/2014  Patient name: Brendan Arroyo  Medical record number: [id number]  Date of birth: 1928/10/26   I have seen and evaluated Brendan Arroyo and discussed their care with the Residency Team.   Assessment and Plan: I have seen and evaluated the patient as outlined above. I agree with the formulated Assessment and Plan as detailed in the residents' admission note, with the following changes:   Brendan Arroyo was noted to have acute exacerbation of CHF in addtion to hyperkalemia. For management of hyperkalemia, we will repeat his bmp after he has received diuresis and see if it still elevated and decide if need to give kaleyexalate. He does not have eckg changes suggestive of hyperkalemia. Diuresis goal of 1-2L per aday to get back to down to net -7 Lbs.  Judyann Munsonynthia Janasha Barkalow, MD 8/7/20154:31 PM

## 2014-05-08 NOTE — Progress Notes (Signed)
UR completed 

## 2014-05-08 NOTE — H&P (Signed)
Date: 05/08/2014               Patient Name:  Brendan Arroyo MRN: [id number]  DOB: 14-Jun-1929 Age / Sex: 80 y.o., male   PCP: Brendan CatchingLeonard Nyland, MD         Medical Service: Internal Medicine Teaching Service         Attending Physician: Dr. Judyann Munsonynthia Snider, MD    First Contact: Dr. Allena Arroyo Pager: [phone]  Second Contact: Dr. Garald Arroyo Pager: [phone]       After Hours (After 5p/  First Contact Pager: [phone]  weekends / holidays): Second Contact Pager: [phone]   Chief Complaint: worsening edema  History of Present Illness: Brendan Arroyo is an 80 year old man with history of HTN, DM type 2, CAD s/p CABG 1993, PCI with stent in 01/2011, COPD on 2L [place] at home, severe pulmonary hypertension presenting with increased weight and worsening edema.   Recently hospitalized 04/26/2014 to 04/29/2014 for syncope 2/2 orthostatic hypotension, pulmonary hypertension, and COPD exacerbation. He was given IVF while holding his antihypertensives/diuretics which resolved the orthostatic hypotension. His home hypertension medications were restarted at discharged and metoprolol switched to bisoprolol. His COPD exacerbation was treated with Spiriva, duonebs, Levaqin 750mg  x 48 hours, and prednisone 40mg  daily.  He saw his PCP Friday who stopped his metformin and instructed him to report his daily blood glucoses and weights. On Tuesday, he was instructed to increase his Lasix 40mg , but he did not start doing this until Wednesday. He has noted at least 7 lbs increase in weight. He reports his lower extremity edema has worsened and he has edema in his abdomen with associated abdominal discomfort. He has minimal increase in his shortness of breath above his baseline. He denies fevers, chills, chest pain, orthopnea, nausea, vomiting, diarrhea, constipation, dysuria, hematuria, change in baseline paresthesias.  In the ED, he received 40mg  IV lasix.  Meds: Current Facility-Administered Medications  Medication Dose Route  Frequency Provider Last Rate Last Dose  . 0.9 %  sodium chloride infusion   Intravenous Continuous Brendan RootsKevin E Steinl, MD 20 mL/hr at 05/08/14 0045     Current Outpatient Prescriptions  Medication Sig Dispense Refill  . amLODipine (NORVASC) 5 MG tablet Take 2.5-5 mg by mouth 2 (two) times daily. Take 5mg  in the am and 2.5mg  in the pm      . atorvastatin (LIPITOR) 20 MG tablet Take 20 mg by mouth every morning.       . bisoprolol (ZEBETA) 5 MG tablet Take 2.5 mg by mouth daily.      . budesonide-formoterol (SYMBICORT) 160-4.5 MCG/ACT inhaler Inhale 2 puffs into the lungs 2 (two) times daily.      . clopidogrel (PLAVIX) 75 MG tablet Take 75 mg by mouth daily.      . Cyanocobalamin (VITAMIN B-12 IJ) Inject 1,000 mcg as directed every 30 (thirty) days.      Marland Kitchen. dextromethorphan-guaiFENesin (MUCINEX DM) 30-600 MG per 12 hr tablet Take 1 tablet by mouth 2 (two) times daily.      . fluticasone (FLONASE) 50 MCG/ACT nasal spray Place 2 sprays into both nostrils daily as needed for allergies.       . furosemide (LASIX) 40 MG tablet Take 20 mg by mouth every morning.       . gabapentin (NEURONTIN) 300 MG capsule Take 300 mg by mouth daily.      Marland Kitchen. glipiZIDE (GLUCOTROL XL) 5 MG 24 hr tablet Take 5 mg by mouth daily with breakfast.      .  hydrochlorothiazide 25 MG tablet Take 25 mg by mouth every morning.       Marland Kitchen HYDROcodone-acetaminophen (NORCO/VICODIN) 5-325 MG per tablet Take 0.5-1 tablets by mouth daily as needed for moderate pain.      . hydroxypropyl methylcellulose (ISOPTO TEARS) 2.5 % ophthalmic solution Place 1 drop into both eyes 3 (three) times daily as needed for dry eyes.      . metFORMIN (GLUCOPHAGE-XR) 500 MG 24 hr tablet Take 500 mg by mouth 2 (two) times daily.      . Multiple Vitamins-Minerals ([employer] SPECTRAVITE SENIOR) TABS Take 1 tablet by mouth daily.        . nitroGLYCERIN (NITROSTAT) 0.4 MG SL tablet Place 0.4 mg under the tongue every 5 (five) minutes as needed for chest pain.      Marland Kitchen  tiotropium (SPIRIVA) 18 MCG inhalation capsule Place 18 mcg into inhaler and inhale daily.        Allergies: Allergies as of 05/07/2014  . (No Known Allergies)   Past Medical History  Diagnosis Date  . Dyslipidemia   . HTN (hypertension)   . DM (diabetes mellitus)   . Carotid artery disease     a. Duplex 04/2014: suggest upper range 1-39% BICA.  Marland Kitchen CAD (coronary artery disease)     a. s/p very remote angioplasty, CABG 1993. b. H/o DES to dRCA at anastamotic site of VG 2005, DES to OM 2008. c. Low risk nuc 01/2014.  . H/O: gout   . Diverticulitis   . Colon polyps   . Blood transfusion     '93  . GERD (gastroesophageal reflux disease)   . Hyperlipidemia   . Renal insufficiency     a. OP Cr 04/10/14: 1.32.  Marland Kitchen Stricture and stenosis of esophagus 11/06/2011    egd with esoph stricture dilation 11/2011.  Brendan Heaps MD  . Esophageal reflux 11/06/2011  . COPD (chronic obstructive pulmonary disease)     a. Oxygen use 2l/m nasally usually nightly and during day as needed.   Past Surgical History  Procedure Laterality Date  . Cabg  1993  . Coronary stent placement  July 2005, 2008    taxus stent of the distal right coronary artery   . Elbow surgery      right  . Tonsillectomy    . Back surgery    . Esophagogastroduodenoscopy  07/31/2012    Procedure: ESOPHAGOGASTRODUODENOSCOPY (EGD);  Surgeon: Meryl Dare, MD,FACG;  Location: [facility] ENDOSCOPY;  Service: Endoscopy;  Laterality: N/A;  pt has food impaction, takes plavix, so no plans to dilate.  . Coronary artery bypass graft    . Esophagogastroduodenoscopy N/A 01/07/2014    Procedure: ESOPHAGOGASTRODUODENOSCOPY (EGD);  Surgeon: Louis Meckel, MD;  Location: [facility] ENDOSCOPY;  Service: Endoscopy;  Laterality: N/A;  . Balloon dilation N/A 01/07/2014    Procedure: BALLOON DILATION;  Surgeon: Louis Meckel, MD;  Location: [facility] ENDOSCOPY;  Service: Endoscopy;  Laterality: N/A;  . Esophagogastroduodenoscopy N/A 03/09/2014    Procedure:  ESOPHAGOGASTRODUODENOSCOPY (EGD);  Surgeon: Louis Meckel, MD;  Location: [facility] ENDOSCOPY;  Service: Endoscopy;  Laterality: N/A;  . Balloon dilation N/A 03/09/2014    Procedure: BALLOON DILATION;  Surgeon: Louis Meckel, MD;  Location: [facility] ENDOSCOPY;  Service: Endoscopy;  Laterality: N/A;   Family History  Problem Relation Age of Onset  . Ovarian cancer Mother   . Diabetes Mother   . Diabetes Brother   . Heart disease Maternal Grandfather   . Colon cancer Neg Hx   .  Esophageal cancer Neg Hx   . Stomach cancer Neg Hx    History   Social History  . Marital Status: Widowed    Spouse Name: N/A    Number of Children: 1  . Years of Education: N/A   Occupational History  . retired    Social History Main Topics  . Smoking status: Former Games developer  . Smokeless tobacco: Never Used     Comment: Quit 2011?. has a 40 pack-year history   . Alcohol Use: 2.4 oz/week    4 Shots of liquor per week     Comment: social -occ.  . Drug Use: No  . Sexual Activity: Not on file   Other Topics Concern  . Not on file   Social History Narrative   Lives in [place] and is retired. Tries to eat a heart health diet and exercises regularly.     Review of Systems: Constitutional: no fevers/chills Eyes: no vision changes Ears, nose, mouth, throat, and face: no change in cough Respiratory: +minimal worsening of chronic shortness of breath Cardiovascular: no chest pain Gastrointestinal: no nausea/vomiting, no abdominal pain, no constipation, no diarrhea Genitourinary: no dysuria, no hematuria Integument: no rash Hematologic/lymphatic: no bleeding/bruising, +edema Musculoskeletal: no myalgias Neurological: +chronic LE paresthesias, no weakness  Physical Exam: Blood pressure 139/75, pulse 73, temperature 97.9 F (36.6 C), temperature source Oral, resp. rate 14, weight 212 lb 8 oz (96.389 kg), SpO2 98.00%. BP 148/70  Pulse 84  Temp(Src) 97.7 F (36.5 C) (Oral)  Resp 16  Ht 5\' 10"  (1.778 m)  Wt  208 lb 8.9 oz (94.6 kg)  BMI 29.92 kg/m2  SpO2 96%  General Appearance:    Alert, cooperative, no distress, appears stated age  Head:    Normocephalic, without obvious abnormality, atraumatic  Eyes:    PERRL, conjunctiva/corneas clear, EOM's intact       Ears:    Normal external ear canals, both ears  Nose:   Nares normal, septum midline, mucosa normal, no drainage  Throat:   Lips, mucosa, and tongue normal  Neck:   Supple, symmetrical, trachea midline, +JVD  Back:     Symmetric, no curvature, ROM normal  Lungs:     Few scattered crackles bilateral bases, clear to auscultation bilaterally otherwise, respirations unlabored  Chest wall:    No tenderness or deformity  Heart:    Regular rate and rhythm, S1 and S2 normal, no murmur, rub   or gallop  Abdomen:     Soft, non-tender, nondistended  Extremities:   3+ pitting edema bilateral lower extremities to mid thighs  Pulses:   1+ and symmetric all extremities  Skin:   Skin color, texture, turgor normal, no rashes or lesions  Neurologic:   CNII-XII intact. No gross deficits    Lab results: Basic Metabolic Panel:  Recent Labs  81/19/14 2334  NA 133*  K 5.4*  CL 98  CO2 22  GLUCOSE 170*  BUN 38*  CREATININE 1.42*  CALCIUM 8.7   Liver Function Tests:  Recent Labs  05/07/14 2334  AST 32  ALT 39  ALKPHOS 133*  BILITOT 0.8  PROT 6.4  ALBUMIN 3.8   CBC:  Recent Labs  05/07/14 2334  WBC 10.4  HGB 12.6*  HCT 38.3*  MCV 90.1  PLT 211   Cardiac Enzymes:  Recent Labs  05/07/14 2334  TROPONINI <0.30   BNP:  Recent Labs  05/07/14 2334  PROBNP 5935.0*   Urinalysis    Component Value Date/Time   COLORURINE  YELLOW 05/08/2014 0223   APPEARANCEUR CLOUDY* 05/08/2014 0223   LABSPEC 1.008 05/08/2014 0223   PHURINE 5.5 05/08/2014 0223   GLUCOSEU NEGATIVE 05/08/2014 0223   HGBUR NEGATIVE 05/08/2014 0223   BILIRUBINUR NEGATIVE 05/08/2014 0223   KETONESUR NEGATIVE 05/08/2014 0223   PROTEINUR NEGATIVE 05/08/2014 0223   UROBILINOGEN  0.2 05/08/2014 0223   NITRITE NEGATIVE 05/08/2014 0223   LEUKOCYTESUR NEGATIVE 05/08/2014 0223    Imaging results:  Dg Chest 2 View  05/08/2014   CLINICAL DATA:  Abdominal pain and short of breath  EXAM: CHEST  2 VIEW  COMPARISON:  04/26/2014  FINDINGS: Prior CABG. Cardiac enlargement without heart failure. Negative for pneumonia. Improvement in small pleural effusions since the prior study.  COPD.  IMPRESSION: COPD with improvement in small pleural effusions.   Electronically Signed   By: Marlan Palau M.D.   On: 05/08/2014 00:15    Other results: EKG: unchanged from previous tracings, normal sinus rhythm, prolonged PR interval ( ), T wave inversions in noncontiguous leads (V1, V6, aVR). ST depression in II, III and aVF seen on previous tracing.  Assessment & Plan by Problem:  LE edema and increased weight: Echo on 04/27/2014 demonstrates LV EF: 60-65%, grade 1 diastolic dysfunction. PA peak pressure and right ventricular systolic pressure increased consistent with severe pulmonary hypertension. BNP 5935 (previously 8321 04/27/2014). Likely fluid overload in the setting of severe pulmonary hypertension. CXR shows improvement in small pleural effusions. Received 40mg  Lasix IV in ED. -Troponin neg x 1, continue to check troponin q6hr x 3 -Continue 2L [place] -20mg  IV Lasix now -daily weights  COPD: on 2L [place] at home. Does not appear to be in acute exacerbation. -continue symbicort 160-4.5 2 puffs BID -continue Mucinex DM BID -continue Flonase 2 sprays prn -continue Spiriva daily  Anemia: Hemoglobin 12.6 at admission. Likely dilutional 2/2 fluid status. -continue to monitor  HTN: chronic, stable. Normotensive in ED -continue amlodipine 5mg  BID -continue bisoprolol 2.5mg  daily -continue HCTZ 20mg  daily  DM2: on metformin and glipizide at home. His PCP instructed him to discontinue metformin. Last Hgb A1c 6.6 on 04/26/2014 -hold glipizide -continue gabapentin 300mg  daily -SSI,  blood glucose checks QID  CAD: s/p CABG 1993, PCI with stent in 01/2011. EKG stable. Troponin in ED neg. -Check troponin -continue bisoprolol 2.5mg  daily -continue Plavix -continue Nitrostat SL 0.4mg  prn chest pain -continue statin 20mg  daily  CKD stage 3: Cr 1.42 and BUN 38 at admission. Baseline around 1.76 and 44 respectively. -continue to monitor  FEN: -Heart healthy/carb modified diet -Hyperkalemia, will receive Lasix  DVT ppx: heparin 5000u subq TID  Dispo: Disposition is deferred at this time, awaiting improvement of current medical problems. Anticipated discharge in approximately 2 day(s).   The patient does have a current PCP Brendan Catching, MD) and does not need an [facility] hospital follow-up appointment after discharge.  The patient does not have transportation limitations that hinder transportation to clinic appointments.  Signed: Griffin Basil, MD 05/08/2014, 2:39 AM

## 2014-05-09 DIAGNOSIS — I509 Heart failure, unspecified: Principal | ICD-10-CM

## 2014-05-09 DIAGNOSIS — I2789 Other specified pulmonary heart diseases: Secondary | ICD-10-CM

## 2014-05-09 DIAGNOSIS — E875 Hyperkalemia: Secondary | ICD-10-CM

## 2014-05-09 LAB — BASIC METABOLIC PANEL
Anion gap: 11 (ref 5–15)
Anion gap: 14 (ref 5–15)
BUN: 33 mg/dL — ABNORMAL HIGH (ref 6–23)
BUN: 31 mg/dL — AB (ref 6–23)
CALCIUM: 8.6 mg/dL (ref 8.4–10.5)
CO2: 25 mEq/L (ref 19–32)
CO2: 21 meq/L (ref 19–32)
Calcium: 9 mg/dL (ref 8.4–10.5)
Chloride: 103 mEq/L (ref 96–112)
Chloride: 101 mEq/L (ref 96–112)
Creatinine, Ser: 1.26 mg/dL (ref 0.50–1.35)
Creatinine, Ser: 1.13 mg/dL (ref 0.50–1.35)
GFR calc Af Amer: 59 mL/min — ABNORMAL LOW (ref >90)
GFR calc non Af Amer: 58 mL/min — ABNORMAL LOW (ref >90)
GFR, EST AFRICAN AMERICAN: 67 mL/min — AB (ref >90)
GFR, EST NON AFRICAN AMERICAN: 51 mL/min — AB (ref >90)
GLUCOSE: 106 mg/dL — AB (ref 70–99)
Glucose, Bld: 160 mg/dL — ABNORMAL HIGH (ref 70–99)
POTASSIUM: 5.6 meq/L — AB (ref 3.7–5.3)
Potassium: 5.8 mEq/L — ABNORMAL HIGH (ref 3.7–5.3)
SODIUM: 139 meq/L (ref 137–147)
Sodium: 136 mEq/L — ABNORMAL LOW (ref 137–147)

## 2014-05-09 LAB — GLUCOSE, CAPILLARY
GLUCOSE-CAPILLARY: 172 mg/dL — AB (ref 70–99)
Glucose-Capillary: 97 mg/dL (ref 70–99)

## 2014-05-09 LAB — POTASSIUM: Potassium: 5.1 mEq/L (ref 3.7–5.3)

## 2014-05-09 MED ORDER — FUROSEMIDE 20 MG PO TABS: ORAL_TABLET | ORAL | Status: DC

## 2014-05-09 MED ORDER — FUROSEMIDE 10 MG/ML IJ SOLN
40.0000 mg | Freq: Once | INTRAMUSCULAR | Status: AC
  Administered 2014-05-09: 40 mg via INTRAVENOUS
  Filled 2014-05-09: qty 4

## 2014-05-09 NOTE — Progress Notes (Signed)
Subjective: No acute events overnight.  He feels better this morning. His weight has dropped 10lbs with 2.2L net output yesterday with Lasix (total dose 60mg ). K was 5.1 this morning with serial EKGs remaining unremarkable.   Objective: Vital signs in last 24 hours: Filed Vitals:   05/08/14 2033 05/09/14 0538 05/09/14 0826 05/09/14 1021  BP: 123/93 146/71  137/53  Pulse: 72 66  65  Temp: 97.5 F (36.4 C) 97.4 F (36.3 C)    TempSrc: Oral Oral    Resp: 18 18    Height:      Weight:  202 lb 12.8 oz (91.989 kg)    SpO2: 99% 99% 98%    Weight change: -9 lb 11.2 oz (-4.4 kg)  Intake/Output Summary (Last 24 hours) at 05/09/14 1418 Last data filed at 05/09/14 1100  Gross per 24 hour  Intake    775 ml  Output   1850 ml  Net  -1075 ml   Vitals reviewed. General: resting in bed, NAD HEENT: PERRL, EOMI, no scleral icterus Cardiac: RRR, no rubs, murmurs or gallops Pulm: clear to auscultation bilaterally, no wheezes, rales, or rhonchi Abd: soft, nontender, nondistended, BS present Ext: 2+ pitting edema improved from yesterday now only below knee bilaterally  Neuro: alert and oriented X3, cranial nerves II-XII grossly intact, strength and sensation to light touch equal in bilateral upper and lower extremities  Lab Results: Basic Metabolic Panel:  Recent Labs Lab 05/08/14 0920 05/09/14 0353 05/09/14 0800 05/09/14 1103  NA 138 139  --  136*  K 5.4* 5.6* 5.1 5.8*  CL 101 103  --  101  CO2 25 25  --  21  GLUCOSE 138* 106*  --  160*  BUN 37* 33*  --  31*  CREATININE 1.37* 1.26  --  1.13  CALCIUM 8.8 8.6  --  9.0  MG 2.1  --   --   --    Liver Function Tests:  Cardiac Enzymes:  Recent Labs Lab 05/07/14 2334 05/08/14 0625 05/08/14 0920  TROPONINI <0.30 <0.30 <0.30   CBG:  Recent Labs Lab 05/08/14 0626 05/08/14 1122 05/08/14 1612 05/08/14 2115 05/09/14 0615 05/09/14 1146  GLUCAP 148* 132* 118* 172* 97 172*    Urinalysis:  Recent Labs Lab  05/08/14 0223  COLORURINE YELLOW  LABSPEC 1.008  PHURINE 5.5  GLUCOSEU NEGATIVE  HGBUR NEGATIVE  BILIRUBINUR NEGATIVE  KETONESUR NEGATIVE  PROTEINUR NEGATIVE  UROBILINOGEN 0.2  NITRITE NEGATIVE  LEUKOCYTESUR NEGATIVE    Medications: I have reviewed the patient's current medications. Scheduled Meds: . amLODipine  5 mg Oral BID  . antiseptic oral rinse  7 mL Mouth Rinse BID  . atorvastatin  20 mg Oral q morning - 10a  . bisoprolol  2.5 mg Oral Daily  . budesonide-formoterol  2 puff Inhalation BID  . clopidogrel  75 mg Oral Daily  . dextromethorphan-guaiFENesin  1 tablet Oral BID  . gabapentin  300 mg Oral Daily  . heparin  5,000 Units Subcutaneous 3 times per day  . hydrochlorothiazide  25 mg Oral q morning - 10a  . insulin aspart  0-9 Units Subcutaneous TID WC  . sodium chloride  3 mL Intravenous Q12H  . tiotropium  18 mcg Inhalation Daily   Continuous Infusions: . sodium chloride Stopped (05/08/14 0402)   PRN Meds:.fluticasone, HYDROcodone-acetaminophen, hydroxypropyl methylcellulose, nitroGLYCERIN, ondansetron (ZOFRAN) IV, ondansetron Assessment/Plan: Principal Problem:   Acute exacerbation of CHF (congestive heart failure) Active Problems:   DIABETES MELLITUS   HYPERTENSION  CKD (chronic kidney disease) stage 3, GFR 30-59 ml/min   CAD (coronary artery disease)   COPD (chronic obstructive pulmonary disease)   CHF (congestive heart failure)   Hyperkalemia   Normocytic anemia  Brendan Arroyo is an 80 year old male with multiple medical co-morbidities hospitalized for acute on chronic CHF in the setting of severe pulmonary HTN complicated by hyperkalemia.   #Acute-on-chronic CHF with severe pulmonary HTN:  His weight at 202 now is lower than 212 on admission and close to 204 at his last discharge. He feels better and is stable for discharge today. He tolerated Lasix 60mg  well yesterday and should be continued on that dose at home with follow-up with his PCP next week to  recheck BMET and consider follow-up with pulmonologist for his pulmonary HTN.  #Hyperkalemia: K 5.1 this AM without EKG changes. He is stable from this standpoint.  #CKD Stage 3: His crt improved from 1.42 on admission to 1.13 with diuresis.   #COPD: Stable on 2L [place] and home medications: symbicort and spiriva   #Normocytic Anemia: Hb 12.6 on admission down from 13.5 at end of July. We did not recheck today.  #HTN: Stable on amlodipine, lasix, bisoprolol, and hctz   #DM2: Stable on SSI, metformin and glipizide. Last Hgb A1c 6.6 on 04/26/2014 with CBG monitoring.   CAD: s/p CABG 1993, PCI with stent in 01/2011. EKG stable with persistent t wave inversions in inferior and anterolateral leads as seen on priors. CE x3 negative.  -Continue bisoprolol, Plavix, and statin  -Give nitroglycerin prn for chest pain   #FEN:  -Diet: renal/carb modified   #DVT prophylaxis: heparin 5000 units subq  Dispo: Disposition is deferred at this time, awaiting improvement of current medical problems.  Anticipated discharge in approximately 1 day(s).   The patient does have a current PCP Joette Catching, MD) and does not need an [facility] hospital follow-up appointment after discharge.  The patient does not have transportation limitations that hinder transportation to clinic appointments.  .Services Needed at time of discharge: Y = Yes, Blank = No PT:   OT:   RN:   Equipment:   Other:     LOS: 2 days   Brendan Iles, MD 05/09/2014, 2:18 PM

## 2014-05-09 NOTE — Progress Notes (Signed)
Verbalized understanding of discharge instructions.  How to take Lasix orally reiterated.

## 2014-05-09 NOTE — Discharge Summary (Signed)
Name: Brendan Arroyo MRN: [id number] DOB: 1929/08/16 80 y.o. PCP: Joette Catching, MD  Date of Admission: 05/07/2014 11:07 PM Date of Discharge: 05/09/2014 Attending Physician: Judyann Munson, MD  Discharge Diagnosis: Principal Problem:   Acute exacerbation of CHF (congestive heart failure) Active Problems:   DIABETES MELLITUS   HYPERTENSION   CKD (chronic kidney disease) stage 3, GFR 30-59 ml/min   CAD (coronary artery disease)   COPD (chronic obstructive pulmonary disease)   CHF (congestive heart failure)   Hyperkalemia   Normocytic anemia  Discharge Medications:   Medication List         amLODipine 5 MG tablet  Commonly known as:  NORVASC  Take 2.5-5 mg by mouth 2 (two) times daily. Take 5mg  in the am and 2.5mg  in the pm     atorvastatin 20 MG tablet  Commonly known as:  LIPITOR  Take 20 mg by mouth every morning.     bisoprolol 5 MG tablet  Commonly known as:  ZEBETA  Take 2.5 mg by mouth daily.     budesonide-formoterol 160-4.5 MCG/ACT inhaler  Commonly known as:  SYMBICORT  Inhale 2 puffs into the lungs 2 (two) times daily.     clopidogrel 75 MG tablet  Commonly known as:  PLAVIX  Take 75 mg by mouth daily.     [facility] SPECTRAVITE SENIOR Tabs  Take 1 tablet by mouth daily.     dextromethorphan-guaiFENesin 30-600 MG per 12 hr tablet  Commonly known as:  MUCINEX DM  Take 1 tablet by mouth 2 (two) times daily.     fluticasone 50 MCG/ACT nasal spray  Commonly known as:  FLONASE  Place 2 sprays into both nostrils daily as needed for allergies.     furosemide 20 MG tablet  Commonly known as:  LASIX  Please take 2 tablets in the morning and 1 tablet in the evening.     gabapentin 300 MG capsule  Commonly known as:  NEURONTIN  Take 300 mg by mouth daily.     glipiZIDE 5 MG 24 hr tablet  Commonly known as:  GLUCOTROL XL  Take 5 mg by mouth daily with breakfast.     hydrochlorothiazide 25 MG tablet  Commonly known as:  HYDRODIURIL  Take 25 mg by mouth  every morning.     HYDROcodone-acetaminophen 5-325 MG per tablet  Commonly known as:  NORCO/VICODIN  Take 0.5-1 tablets by mouth daily as needed for moderate pain.     hydroxypropyl methylcellulose 2.5 % ophthalmic solution  Commonly known as:  ISOPTO TEARS  Place 1 drop into both eyes 3 (three) times daily as needed for dry eyes.     metFORMIN 500 MG 24 hr tablet  Commonly known as:  GLUCOPHAGE-XR  Take 500 mg by mouth 2 (two) times daily.     metoprolol 50 MG tablet  Commonly known as:  LOPRESSOR  Take 50 mg by mouth 2 (two) times daily.     nitroGLYCERIN 0.4 MG SL tablet  Commonly known as:  NITROSTAT  Place 0.4 mg under the tongue every 5 (five) minutes as needed for chest pain.     tiotropium 18 MCG inhalation capsule  Commonly known as:  SPIRIVA  Place 18 mcg into inhaler and inhale daily.     VITAMIN B-12 IJ  Inject 1,000 mcg as directed every 30 (thirty) days.        Disposition and follow-up:   BrendanRhodes W Arroyo was discharged from [facility] in Stable condition.  At the hospital follow up visit please address:  1.  CHF: fluid status  2.  Labs / imaging needed at time of follow-up: BMET since we increased Lasix 40->60mg   3.  Referral to pulmonology for further treatment of pulmonary HTN  4.  Pending labs/ test needing follow-up: none  Follow-up Appointments:     Discharge Instructions: Discharge Instructions   (HEART FAILURE PATIENTS) Call MD:  Anytime you have any of the following symptoms: 1) 3 pound weight gain in 24 hours or 5 pounds in 1 week 2) shortness of breath, with or without a dry hacking cough 3) swelling in the hands, feet or stomach 4) if you have to sleep on extra pillows at night in order to breathe.    Complete by:  As directed      (HEART FAILURE PATIENTS) Call MD:  Anytime you have any of the following symptoms: 1) 3 pound weight gain in 24 hours or 5 pounds in 1 week 2) shortness of breath, with or without a dry  hacking cough 3) swelling in the hands, feet or stomach 4) if you have to sleep on extra pillows at night in order to breathe.    Complete by:  As directed      Diet - low sodium heart healthy    Complete by:  As directed      Diet - low sodium heart healthy    Complete by:  As directed      Increase activity slowly    Complete by:  As directed      Increase activity slowly    Complete by:  As directed            Procedures Performed:  Dg Chest 2 View  05/08/2014   CLINICAL DATA:  Abdominal pain and short of breath  EXAM: CHEST  2 VIEW  COMPARISON:  04/26/2014  FINDINGS: Prior CABG. Cardiac enlargement without heart failure. Negative for pneumonia. Improvement in small pleural effusions since the prior study.  COPD.  IMPRESSION: COPD with improvement in small pleural effusions.   Electronically Signed   By: Marlan Palau M.D.   On: 05/08/2014 00:15    Admission HPI: Brendan Arroyo is an 80 year old man with history of HTN, DM type 2, CAD s/p CABG 1993, PCI with stent in 01/2011, COPD on 2L [place] at home, severe pulmonary hypertension presenting with increased weight and worsening edema.   Recently hospitalized 04/26/2014 to 04/29/2014 for syncope 2/2 orthostatic hypotension, pulmonary hypertension, and COPD exacerbation. He was given IVF while holding his antihypertensives/diuretics which resolved the orthostatic hypotension. His home hypertension medications were restarted at discharged and metoprolol switched to bisoprolol. His COPD exacerbation was treated with Spiriva, duonebs, Levaqin 750mg  x 48 hours, and prednisone 40mg  daily.   He saw his PCP Friday who stopped his metformin and instructed him to report his daily blood glucoses and weights. On Tuesday, he was instructed to increase his Lasix 40mg , but he did not start doing this until Wednesday. He has noted at least 7 lbs increase in weight. He reports his lower extremity edema has worsened and he has edema in his abdomen with associated  abdominal discomfort. He has minimal increase in his shortness of breath above his baseline. He denies fevers, chills, chest pain, orthopnea, nausea, vomiting, diarrhea, constipation, dysuria, hematuria, change in baseline paresthesias.   In the ED, he received 40mg  IV lasix.   Hospital Course by problem list: Principal Problem:   Acute exacerbation of CHF (congestive heart  failure) Active Problems:   DIABETES MELLITUS   HYPERTENSION   CKD (chronic kidney disease) stage 3, GFR 30-59 ml/min   CAD (coronary artery disease)   COPD (chronic obstructive pulmonary disease)   CHF (congestive heart failure)   Hyperkalemia   Normocytic anemia   1. Acute exacerbation of CHF: He received Lasix 40mg  in the ED followed by another 20mg  several hours later. He diuresed 2.2L. Weight on admission of 212 lbs decreased to 204 lbs with pitting edema resolving above his knees in his thighs.  At the time of discharge, he reported improvement in his shortness of breath which correlated with diminished pitting edema at the level of his thighs and resolution of crackles with lung auscultation.   2. Hyperkalemia: K ranged 5.4-5.6 during his hospitalization. Serial EKGs showed T-wave depressions in V1-6 and first-degree heart block (PR 2.2-2.4s) stable from prior admission (7/27). He received a one-time dose of IV calcium gluconate but denied chest pain or palpitations at the time of discharge.   3. CKD Stage 3: Crt on admission improved 1.42->1.13 on diuresis.   4. COPD: He remained stable on 2L O2 and home medications.  5. Normocytic Anemia: Hb 12.6 on admission down from 13.5 at end of July which may have been due to fluid overload. Hb was not rechecked on day of discharge.   6. HTN: BP ranged 120-160/60-75 during his hospital stay and was controlled on home medication   7. DM2: He was maintained on SSI, metformin and glipizide.  8. CAD: Troponins x 3 were unremarkable, and EKG showed findings similar to  those of prior admission. He was continued on his home medication.  Discharge Vitals:   BP 137/53  Pulse 65  Temp(Src) 97.4 F (36.3 C) (Oral)  Resp 18  Ht 5\' 10"  (1.778 m)  Wt 202 lb 12.8 oz (91.989 kg)  BMI 29.10 kg/m2  SpO2 98%  Discharge Labs:  Results for orders placed during the hospital encounter of 05/07/14 (from the past 24 hour(s))  GLUCOSE, CAPILLARY     Status: Abnormal   Collection Time    05/08/14  4:12 PM      Result Value Ref Range   Glucose-Capillary 118 (*) 70 - 99 mg/dL  GLUCOSE, CAPILLARY     Status: Abnormal   Collection Time    05/08/14  9:15 PM      Result Value Ref Range   Glucose-Capillary 172 (*) 70 - 99 mg/dL  BASIC METABOLIC PANEL     Status: Abnormal   Collection Time    05/09/14  3:53 AM      Result Value Ref Range   Sodium 139  137 - 147 mEq/L   Potassium 5.6 (*) 3.7 - 5.3 mEq/L   Chloride 103  96 - 112 mEq/L   CO2 25  19 - 32 mEq/L   Glucose, Bld 106 (*) 70 - 99 mg/dL   BUN 33 (*) 6 - 23 mg/dL   Creatinine, Ser 1.61  0.50 - 1.35 mg/dL   Calcium 8.6  8.4 - 09.6 mg/dL   GFR calc non Af Amer 51 (*) >90 mL/min   GFR calc Af Amer 59 (*) >90 mL/min   Anion gap 11  5 - 15  GLUCOSE, CAPILLARY     Status: None   Collection Time    05/09/14  6:15 AM      Result Value Ref Range   Glucose-Capillary 97  70 - 99 mg/dL  POTASSIUM     Status: None  Collection Time    05/09/14  8:00 AM      Result Value Ref Range   Potassium 5.1  3.7 - 5.3 mEq/L  BASIC METABOLIC PANEL     Status: Abnormal   Collection Time    05/09/14 11:03 AM      Result Value Ref Range   Sodium 136 (*) 137 - 147 mEq/L   Potassium 5.8 (*) 3.7 - 5.3 mEq/L   Chloride 101  96 - 112 mEq/L   CO2 21  19 - 32 mEq/L   Glucose, Bld 160 (*) 70 - 99 mg/dL   BUN 31 (*) 6 - 23 mg/dL   Creatinine, Ser 1.611.13  0.50 - 1.35 mg/dL   Calcium 9.0  8.4 - 09.610.5 mg/dL   GFR calc non Af Amer 58 (*) >90 mL/min   GFR calc Af Amer 67 (*) >90 mL/min   Anion gap 14  5 - 15  GLUCOSE, CAPILLARY      Status: Abnormal   Collection Time    05/09/14 11:46 AM      Result Value Ref Range   Glucose-Capillary 172 (*) 70 - 99 mg/dL    Signed: Heywood Ilesushil Patel, MD 05/09/2014, 2:42 PM    Services Ordered on Discharge: None Equipment Ordered on Discharge: None

## 2014-05-09 NOTE — Discharge Instructions (Addendum)
Thank you for trusting [place] with your medical care!  You were hospitalized for acute exacerbation of congestive heart failure. See below for more information.  To avoid overloading your heart with a lot of fluid, please increase the amount of Lasix to 60mg .   Please follow-up with your primary doctor next week to recheck your labs.  If you feel your symptoms are getting worse, please come to the ED.     Heart Failure Heart failure means your heart has trouble pumping blood. This makes it hard for your body to work well. Heart failure is usually a long-term (chronic) condition. You must take good care of yourself and follow your doctor's treatment plan. HOME CARE  Take your heart medicine as told by your doctor.  Do not stop taking medicine unless your doctor tells you to.  Do not skip any dose of medicine.  Refill your medicines before they run out.  Take other medicines only as told by your doctor or pharmacist.  Stay active if told by your doctor. The elderly and people with severe heart failure should talk with a doctor about physical activity.  Eat heart-healthy foods. Choose foods that are without trans fat and are low in saturated fat, cholesterol, and salt (sodium). This includes fresh or frozen fruits and vegetables, fish, lean meats, fat-free or low-fat dairy foods, whole grains, and high-fiber foods. Lentils and dried peas and beans (legumes) are also good choices.  Limit salt if told by your doctor.  Cook in a healthy way. Roast, grill, broil, bake, poach, steam, or stir-fry foods.  Limit fluids as told by your doctor.  Weigh yourself every morning. Do this after you pee (urinate) and before you eat breakfast. Write down your weight to give to your doctor.  Take your blood pressure and write it down if your doctor tells you to.  Ask your doctor how to check your pulse. Check your pulse as told.  Lose weight if told by your doctor.  Stop smoking or chewing tobacco.  Do not use gum or patches that help you quit without your doctor's approval.  Schedule and go to doctor visits as told.  Nonpregnant women should have no more than 1 drink a day. Men should have no more than 2 drinks a day. Talk to your doctor about drinking alcohol.  Stop illegal drug use.  Stay current with shots (immunizations).  Manage your health conditions as told by your doctor.  Learn to manage your stress.  Rest when you are tired.  If it is really hot outside:  Avoid intense activities.  Use air conditioning or fans, or get in a cooler place.  Avoid caffeine and alcohol.  Wear loose-fitting, lightweight, and light-colored clothing.  If it is really cold outside:  Avoid intense activities.  Layer your clothing.  Wear mittens or gloves, a hat, and a scarf when going outside.  Avoid alcohol.  Learn about heart failure and get support as needed.  Get help to maintain or improve your quality of life and your ability to care for yourself as needed. GET HELP IF:   You gain 03 lb/1.4 kg or more in 1 day or 05 lb/2.3 kg in a week.  You are more short of breath than usual.  You cannot do your normal activities.  You tire easily.  You cough more than normal, especially with activity.  You have any or more puffiness (swelling) in areas such as your hands, feet, ankles, or belly (abdomen).  You  cannot sleep because it is hard to breathe.  You feel like your heart is beating fast (palpitations).  You get dizzy or light-headed when you stand up. GET HELP RIGHT AWAY IF:   You have trouble breathing.  There is a change in mental status, such as becoming less alert or not being able to focus.  You have chest pain or discomfort.  You faint. MAKE SURE YOU:   Understand these instructions.  Will watch your condition.  Will get help right away if you are not doing well or get worse. Document Released: 06/27/2008 Document Revised: 02/02/2014 Document  Reviewed: 11/04/2012 [facility] Patient Information 2015 [employer] [place]. This information is not intended to replace advice given to you by your health care provider. Make sure you discuss any questions you have with your health care provider.

## 2014-05-11 ENCOUNTER — Other Ambulatory Visit: Payer: Self-pay | Admitting: Cardiovascular Disease

## 2014-05-13 ENCOUNTER — Other Ambulatory Visit: Payer: Self-pay | Admitting: Cardiovascular Disease

## 2014-05-18 ENCOUNTER — Ambulatory Visit (INDEPENDENT_AMBULATORY_CARE_PROVIDER_SITE_OTHER): Payer: Medicare Other | Admitting: Gastroenterology

## 2014-05-18 ENCOUNTER — Encounter: Payer: Self-pay | Admitting: Gastroenterology

## 2014-05-18 VITALS — BP 92/50 | HR 56 | Ht 70.0 in | Wt 197.2 lb

## 2014-05-18 DIAGNOSIS — K222 Esophageal obstruction: Secondary | ICD-10-CM

## 2014-05-18 DIAGNOSIS — I25810 Atherosclerosis of coronary artery bypass graft(s) without angina pectoris: Secondary | ICD-10-CM

## 2014-05-18 MED ORDER — ESOMEPRAZOLE MAGNESIUM 40 MG PO CPDR: 40.0000 mg | DELAYED_RELEASE_CAPSULE | Freq: Every day | ORAL | Status: DC

## 2014-05-18 NOTE — Assessment & Plan Note (Signed)
Status post dilation of a distal esophageal stricture with resolution of dysphagia.  Because of his multiple comorbidities I will hold dilation to 16.5 mm.  Patient was carefully instructed to contact me if dysphagia recurs.  In addition, I will add Nexium.

## 2014-05-18 NOTE — Progress Notes (Signed)
      History of Present Illness:  [name] has returned following sequential dilatation of a peptic stricture.  Dysphagia has resolved.  Since his last visit he has been hospitalized for congestive heart failure.    Review of Systems: Pertinent positive and negative review of systems were noted in the above HPI section. All other review of systems were otherwise negative.    Current Medications, Allergies, Past Medical History, Past Surgical History, Family History and Social History were [facility] in [facility]  Vital signs were reviewed in today's medical record. Physical Exam: General: Chronically ill-appearing in no acute distress   See Assessment and Plan under Problem List

## 2014-05-18 NOTE — Patient Instructions (Signed)
Follow up as needed

## 2014-06-15 ENCOUNTER — Ambulatory Visit (INDEPENDENT_AMBULATORY_CARE_PROVIDER_SITE_OTHER): Payer: Medicare Other | Admitting: Cardiology

## 2014-06-15 ENCOUNTER — Encounter: Payer: Self-pay | Admitting: Cardiology

## 2014-06-15 VITALS — BP 94/51 | HR 80 | Ht 70.0 in | Wt 195.4 lb

## 2014-06-15 DIAGNOSIS — E785 Hyperlipidemia, unspecified: Secondary | ICD-10-CM

## 2014-06-15 DIAGNOSIS — N183 Chronic kidney disease, stage 3 unspecified: Secondary | ICD-10-CM

## 2014-06-15 DIAGNOSIS — I658 Occlusion and stenosis of other precerebral arteries: Secondary | ICD-10-CM

## 2014-06-15 DIAGNOSIS — J449 Chronic obstructive pulmonary disease, unspecified: Secondary | ICD-10-CM

## 2014-06-15 DIAGNOSIS — I6523 Occlusion and stenosis of bilateral carotid arteries: Secondary | ICD-10-CM

## 2014-06-15 DIAGNOSIS — I2789 Other specified pulmonary heart diseases: Secondary | ICD-10-CM

## 2014-06-15 DIAGNOSIS — I10 Essential (primary) hypertension: Secondary | ICD-10-CM

## 2014-06-15 DIAGNOSIS — I2510 Atherosclerotic heart disease of native coronary artery without angina pectoris: Secondary | ICD-10-CM

## 2014-06-15 DIAGNOSIS — I2781 Cor pulmonale (chronic): Secondary | ICD-10-CM

## 2014-06-15 DIAGNOSIS — I6529 Occlusion and stenosis of unspecified carotid artery: Secondary | ICD-10-CM

## 2014-06-15 DIAGNOSIS — I279 Pulmonary heart disease, unspecified: Secondary | ICD-10-CM

## 2014-06-15 DIAGNOSIS — I272 Pulmonary hypertension, unspecified: Secondary | ICD-10-CM

## 2014-06-15 DIAGNOSIS — I25810 Atherosclerosis of coronary artery bypass graft(s) without angina pectoris: Secondary | ICD-10-CM

## 2014-06-15 DIAGNOSIS — I5032 Chronic diastolic (congestive) heart failure: Secondary | ICD-10-CM

## 2014-06-15 NOTE — Progress Notes (Signed)
Clinical Summary Brendan Arroyo is a 80 y.o.male last seen by Dr Excell Seltzer, this is our first visit together. He is seen for the following medical problems.  1. CAD - remote hx of angioplasty over 20 years ago. CABG in 1993. - last cath Jan 2010 LM patent, LAD occluded, LIMA-LAD patent, LCX patent, OM1 40 ostial, patent AV circumflex stent, OM2 60% and stable, RCA 50-60% mid. Patent SVG-RCA with patent stent at its insertion, LVEF normal.  - from notes he has been continued on longterm DAPT.  [name] 01/2014 low risk, no evidence of ischemia or scar, LVEF 70% - denies any chest pain. Stable DOE that is multifactorial  2. Carotid stenosis - mild bilateral disease on last [place] 04/2014 - no current symptoms  3. HTN - more recently more of an issue with low blood pressures - bp meds have been downtitrated. Can have lightheadeness/dizziness at times, though no recent symptoms  4. Hyperlipidemia - compliant with lipitor  daily  5. Chronic diastolic heart failure/Chronic right ventricular systolic dysfunction - recent admit 05/2014 with volume overload, diuresed 2.2 liters, weight went from 212 to 204 lbs. Today's weight is 195 lbs, at home stable at 195-197.  - echo 04/2014 LVEF 60-65%, grade I diastolic dysfunction, severe RV dysfunction, moderate TR with PASP 101 - diuretics have been limited due to syncope thought related to orthostatic hypotension July 2015.  - denies any LE edema  6. CKD stage 3 - reports recent labs with pcp  7. Pulmonary HTN - echo 04/2014 PASP 101 mmHg, severe RV systolic dysfunction, grade I diastolic dysfunction - likely etiology is related to severe COPD and chronic hypoxia, he is on home oxygen  8. COPD - on chronic home oxygen - followed by Dr Orson Aloe pulmonary  Past Medical History  Diagnosis Date  . Dyslipidemia   . HTN (hypertension)   . DM (diabetes mellitus)   . Carotid artery disease     a. Duplex 04/2014: suggest upper range 1-39% BICA.  Marland Kitchen  CAD (coronary artery disease)     a. s/p very remote angioplasty, CABG 1993. b. H/o DES to dRCA at anastamotic site of VG 2005, DES to OM 2008. c. Low risk nuc 01/2014.  . H/O: gout   . Diverticulitis   . Colon polyps   . Blood transfusion     '93  . GERD (gastroesophageal reflux disease)   . Hyperlipidemia   . Renal insufficiency     a. OP Cr 04/10/14: 1.32.  Marland Kitchen Stricture and stenosis of esophagus 11/06/2011    egd with esoph stricture dilation 11/2011.  [name]  . Esophageal reflux 11/06/2011  . COPD (chronic obstructive pulmonary disease)     a. Oxygen use 2l/m nasally usually nightly and during day as needed.     No Known Allergies   Current Outpatient Prescriptions  Medication Sig Dispense Refill  . atorvastatin (LIPITOR) 20 MG tablet Take 20 mg by mouth every morning.       . clopidogrel (PLAVIX) 75 MG tablet Take 75 mg by mouth daily.      . Cyanocobalamin (VITAMIN B-12 IJ) Inject 1,000 mcg as directed every 30 (thirty) days.      Marland Kitchen esomeprazole (NEXIUM) 40 MG capsule Take 1 capsule (40 mg total) by mouth daily at 12 noon.  30 capsule  12  . furosemide (LASIX) 40 MG tablet TAKE ONE TABLET BY MOUTH ONCE DAILY AS NEEDED  90 tablet  0  . gabapentin (NEURONTIN)  300 MG capsule Take 300 mg by mouth daily.      Marland Kitchen glipiZIDE (GLUCOTROL XL) 5 MG 24 hr tablet Take 5 mg by mouth daily with breakfast.      . guaiFENesin (MUCINEX) 600 MG 12 hr tablet Take 600 mg by mouth 2 (two) times daily.      Marland Kitchen HYDROcodone-acetaminophen (NORCO/VICODIN) 5-325 MG per tablet Take 0.5-1 tablets by mouth daily as needed for moderate pain.      . indomethacin (INDOCIN) 50 MG capsule Take 50 mg by mouth 2 (two) times daily with a meal.      . metoprolol (LOPRESSOR) 50 MG tablet Take 50 mg by mouth 2 (two) times daily.      . Multiple Vitamins-Minerals ([employer] SPECTRAVITE SENIOR) TABS Take 1 tablet by mouth daily.        . nitroGLYCERIN (NITROSTAT) 0.4 MG SL tablet Place 0.4 mg under the tongue every 5 (five)  minutes as needed for chest pain.      Marland Kitchen tiotropium (SPIRIVA) 18 MCG inhalation capsule Place 18 mcg into inhaler and inhale daily.       No current facility-administered medications for this visit.     Past Surgical History  Procedure Laterality Date  . Cabg  1993  . Coronary stent placement  July 2005, 2008    taxus stent of the distal right coronary artery   . Elbow surgery      right  . Tonsillectomy    . Back surgery    . Esophagogastroduodenoscopy  07/31/2012    Procedure: ESOPHAGOGASTRODUODENOSCOPY (EGD);  Surgeon: Meryl Dare, MD,FACG;  Location: [facility] ENDOSCOPY;  Service: Endoscopy;  Laterality: N/A;  pt has food impaction, takes plavix, so no plans to dilate.  . Coronary artery bypass graft    . Esophagogastroduodenoscopy N/A 01/07/2014    Procedure: ESOPHAGOGASTRODUODENOSCOPY (EGD);  Surgeon: Louis Meckel, MD;  Location: [facility] ENDOSCOPY;  Service: Endoscopy;  Laterality: N/A;  . Balloon dilation N/A 01/07/2014    Procedure: BALLOON DILATION;  Surgeon: Louis Meckel, MD;  Location: [facility] ENDOSCOPY;  Service: Endoscopy;  Laterality: N/A;  . Esophagogastroduodenoscopy N/A 03/09/2014    Procedure: ESOPHAGOGASTRODUODENOSCOPY (EGD);  Surgeon: Louis Meckel, MD;  Location: [facility] ENDOSCOPY;  Service: Endoscopy;  Laterality: N/A;  . Balloon dilation N/A 03/09/2014    Procedure: BALLOON DILATION;  Surgeon: Louis Meckel, MD;  Location: [facility] ENDOSCOPY;  Service: Endoscopy;  Laterality: N/A;     No Known Allergies    Family History  Problem Relation Age of Onset  . Ovarian cancer Mother   . Diabetes Mother   . Diabetes Brother   . Heart disease Maternal Grandfather   . Colon cancer Neg Hx   . Esophageal cancer Neg Hx   . Stomach cancer Neg Hx      Social History Brendan Arroyo reports that he has quit smoking. He has never used smokeless tobacco. Brendan Arroyo reports that he drinks about 2.4 ounces of alcohol per week.   Review of Systems CONSTITUTIONAL: No weight loss, fever,  chills, weakness or fatigue.  HEENT: Eyes: No visual loss, blurred vision, double vision or yellow sclerae.No hearing loss, sneezing, congestion, runny nose or sore throat.  SKIN: No rash or itching.  CARDIOVASCULAR: per HPI RESPIRATORY: + SOB GASTROINTESTINAL: No anorexia, nausea, vomiting or diarrhea. No abdominal pain or blood.  GENITOURINARY: No burning on urination, no polyuria NEUROLOGICAL: No headache, dizziness, syncope, paralysis, ataxia, numbness or tingling in the extremities. No change in bowel or bladder control.  MUSCULOSKELETAL: No muscle, back pain, joint pain or stiffness.  LYMPHATICS: No enlarged nodes. No history of splenectomy.  PSYCHIATRIC: No history of depression or anxiety.  ENDOCRINOLOGIC: No reports of sweating, cold or heat intolerance. No polyuria or polydipsia.  Marland Kitchen   Physical Examination p 80 bp 94/51 Wt 195 lbs BMI 28 O2sat 92% Gen: resting comfortably, no acute distress HEENT: no scleral icterus, pupils equal round and reactive, no palptable cervical adenopathy,  CV: RRR, no m/r/g, no JVD, no carotid bruits Resp: Clear to auscultation bilaterally GI: abdomen is soft, non-tender, non-distended, normal bowel sounds, no hepatosplenomegaly MSK: extremities are warm, no edema.  Skin: warm, no rash Neuro:  no focal deficits Psych: appropriate affect   Diagnostic Studies Cath Jan 2010 HEMODYNAMIC DATA:  1. The central aortic pressure is 152/61, mean 93.  2. Left ventricular pressure 164/5.  3. There was no gradient on pullback across aortic valve.  ANGIOGRAPHIC DATA:  1. The left main is free of critical disease.  2. The left anterior descending artery is totally occluded after a  small diagonal.  3. The internal mammary to the diagonal and distal LAD appears to be  intact, with good runoff into both branches. There is disease  between the 2 branches.  4. The native circumflex is calcified. There is a first marginal that  has about 40% ostial  narrowing, but is fairly small in caliber.  The AV circumflex after the takeoff of the second marginal is  previously stented and there is no evidence of any significant  restenosis at this site. The second marginal itself has an  eccentric plaque of about 60% noted in the midportion of that  artery. It has been previously visualized, and compared to  previous study, I do not observe a substantial change. Distally,  the AV circumflex has about 50% eccentric bend leading into the  fairly large third marginal Brendan Arroyo.  5. The right coronary artery has a 50-60% mid stenosis. The vessel is  then diffusely diseased distally, and there is competitive filling.  6. The vein graft at its inserts into the distal right covering the PD  and PLA system appears to be widely patent. The stent itself is  patent as well. There is maybe 40% in the continuation Brendan Arroyo.  7. The ventriculogram by hand only suggests preserved LV function.  CONCLUSION:  1. Preserved overall left ventricular function.  2. Continued patency of the internal mammary to the diagonal LAD.  3. Continued patency of the saphenous vein graft to the distal right  with continued patency of the stent at its insertion.  4. Continued patency of the stent to the mid circumflex.  5. Moderate lesions of the OM-2 and distal AV circumflex as described  above.  DISPOSITION: I have carefully reviewed the old films and compared them  to the current studies. The circumflex OM lesions do not appear to be  substantially progressed. It potentially could be the source of  ischemia, but does not appear to be unstable as such, given the current  anatomy I am inclined to treat him medically at the present time. We  will see how he does first and I will see him back in followup in the  office soon.   04/2014 Carotid US Summary: Findings suggest upper range 1-39% internal carotid artery stenosis bilaterally. Vertebral arteries are patent with antegrade  flow.  Other specific details can be found in the table(s) above. Prepared and Electronically Authenticated by   01/2014 MPI Impression  Exercise Capacity: Lexiscan with no exercise.  BP Response: Normal blood pressure response.  Clinical Symptoms: No chest pain.  ECG Impression: No change from baseline pattern of widespread T wave inversion.  Comparison with Prior Nuclear Study: No images to compare  Overall Impression: Low risk stress nuclear study. No evidence of ischemia or scar. No new EKG changes with stress. Normal LV systolic function.  LV Ejection Fraction: 70%. LV Wall Motion: Normal Wall Motion  04/2014 Echo Study Conclusions  - Left ventricle: The cavity size was normal. There was mild focal basal hypertrophy of the septum. Systolic function was normal. The estimated ejection fraction was in the range of 60% to 65%. Wall motion was normal; there were no regional wall motion abnormalities. There was an increased relative contribution of atrial contraction to ventricular filling. Doppler parameters are consistent with abnormal left ventricular relaxation (grade 1 diastolic dysfunction). - Mitral valve: Calcified annulus. There was trivial regurgitation. - Left atrium: The atrium was mildly dilated. - Right ventricle: The cavity size was moderately dilated. Wall thickness was normal. Systolic function was severely reduced. - Right atrium: The atrium was severely dilated. - Tricuspid valve: There was moderate regurgitation. - Pulmonic valve: There was trivial regurgitation. - Pulmonary arteries: PA peak pressure: 101 mm Hg (S).  Impressions:  - The right ventricular systolic pressure was increased consistent with severe pulmonary hypertension.     Assessment and Plan  1. CAD - no current symptoms - Lexiscan MPI 01/2014 negative for ischemia - continue risk factor modification and secondary prevention  2. Carotid stenosis - mild bilateral disease by recent US,  no symptoms - continue to follow clinically  3. HTN - more recently issues has been symptomatic hypotension, medications have been downtitrated. No current symptoms - continue current meds  4. Hyperlipidemia - followed by pcp, given his hx of CAD I recommend high dose statin (lipitor  daily)  5. Chronic diastolic HF - weight remains stable, appears euvolemic - continue current diuretics, fragile balance given his diastolic dysfunction, RV failure, CKD, and issues with hypotension  6. Severe pulmonary hypertension with right sided dysfunction - cor pulmonale due to chronic severe COPD with chronic hypoxia, left isded dysfunction may also contribute some to elevated pulm pressures - continue home O2, continue diuretics. - with severe chronic hypoxia not a candidate for vasodilator therapy  7. CKD stage 3 - followed closely by pcp, will request most recent labs  8. COPD - continue home O2, continue to follow with pulm   F/u 2 months   Antoine Poche, M.D., F.A.C.C.

## 2014-06-15 NOTE — Patient Instructions (Signed)
There were no changes to your medications. Continue as directed. Follow up in two months.

## 2014-07-24 ENCOUNTER — Telehealth: Payer: Self-pay | Admitting: *Deleted

## 2014-07-24 NOTE — Telephone Encounter (Signed)
Care everywhere inquiry for any recent labs.   Dr. Wyline MoodBranch - please see care everywhere.  Recent labs from 06/09/2014.    Labs from PMD requested at [facility] on 06/15/2014.

## 2014-07-30 ENCOUNTER — Ambulatory Visit: Payer: Medicare Other | Admitting: Cardiovascular Disease

## 2014-08-05 ENCOUNTER — Encounter: Payer: Self-pay | Admitting: Cardiology

## 2014-08-17 NOTE — Progress Notes (Signed)
Clinical Summary [name] is a 80 y.o.male seen today for follow up of the following medical problems.  1. CAD - remote hx of angioplasty over 20 years ago. CABG in 1993. - last cath Jan 2010 LM patent, LAD occluded, LIMA-LAD patent, LCX patent, OM1 40 ostial, patent AV circumflex stent, OM2 60% and stable, RCA 50-60% mid. Patent SVG-RCA with patent stent at its insertion, LVEF normal.  - from notes he has been continued on longterm plavix as opposed to ASA, he is not sure why. Brendan Arroyo 01/2014 low risk, no evidence of ischemia or scar, LVEF 70%  - since our last visit he denies any significant chest pain. Chronic SOB/DOE related to his chronic lung disease which is overall stable.   2. Carotid stenosis - mild bilateral disease on last [place] 04/2014 - denies any neurological symptoms  3 . Hyperlipidemia - compliant with lipitor 20mg  daily  4. Chronic diastolic heart failure/Chronic right ventricular systolic dysfunction  - echo 04/2014 LVEF 60-65%, grade I diastolic dysfunction, severe RV dysfunction, moderate TR with PASP 101 - diuretics have been limited due to syncope thought related to orthostatic hypotension July 2015.   -recent admit to [facility] 08/2014 aftear a fall. No LOC. Patient with AKI and Cr of 3, thought to be dehydrated with related fall. Diuretics held, gentle IVFs given with Cr downtrend to 1.95 at discharge.  - currently off diuretics since discharge, taking only prn if 3lbs weight gain. Weighs daily, stable in 201-204 range. Has repeat labs tomorrow with Brendan Arroyo.    5. Pulmonary HTN - echo 04/2014 PASP 101 mmHg, severe RV systolic dysfunction, grade I diastolic dysfunction - likely etiology is related to severe COPD and chronic hypoxia, he is on home oxygen  6. COPD - on chronic home oxygen - followed by Brendan Brendan Arroyo pulmonary  7. OSA - just started on CPAP  Past Medical History  Diagnosis Date  . Dyslipidemia   . HTN (hypertension)   . DM  (diabetes mellitus)   . Carotid artery disease     a. Duplex 04/2014: suggest upper range 1-39% BICA.  Marland Kitchen CAD (coronary artery disease)     a. s/p very remote angioplasty, CABG 1993. b. H/o DES to dRCA at anastamotic site of VG 2005, DES to OM 2008. c. Low risk nuc 01/2014.  . H/O: gout   . Diverticulitis   . Colon polyps   . Blood transfusion     '93  . GERD (gastroesophageal reflux disease)   . Hyperlipidemia   . Renal insufficiency     a. OP Cr 04/10/14: 1.32.  Marland Kitchen Stricture and stenosis of esophagus 11/06/2011    egd with esoph stricture dilation 11/2011.  Brendan Heaps MD  . Esophageal reflux 11/06/2011  . COPD (chronic obstructive pulmonary disease)     a. Oxygen use 2l/m nasally usually nightly and during day as needed.     No Known Allergies   Current Outpatient Prescriptions  Medication Sig Dispense Refill  . atorvastatin (LIPITOR) 40 MG tablet Take 20 mg by mouth every morning.    . clopidogrel (PLAVIX) 75 MG tablet Take 75 mg by mouth daily.    . Cyanocobalamin (VITAMIN B-12 IJ) Inject 1,000 mcg as directed every 30 (thirty) days.    Marland Kitchen esomeprazole (NEXIUM) 40 MG capsule Take 1 capsule (40 mg total) by mouth daily at 12 noon. 30 capsule 12  . furosemide (LASIX) 40 MG tablet take one tablet by mouth twice a day    .  gabapentin (NEURONTIN) 300 MG capsule Take 300 mg by mouth daily.    Marland Kitchen. glipiZIDE (GLUCOTROL XL) 5 MG 24 hr tablet Take 5 mg by mouth daily with breakfast.    . guaiFENesin (MUCINEX) 600 MG 12 hr tablet Take 600 mg by mouth 2 (two) times daily.    . indomethacin (INDOCIN) 50 MG capsule Take 50 mg by mouth as needed.     . metoprolol (LOPRESSOR) 50 MG tablet Take 50 mg by mouth 2 (two) times daily.    . Multiple Vitamins-Minerals ([employer] SPECTRAVITE SENIOR) TABS Take 1 tablet by mouth daily.      . nitroGLYCERIN (NITROSTAT) 0.4 MG SL tablet Place 0.4 mg under the tongue every 5 (five) minutes as needed for chest pain.    Marland Kitchen. tiotropium (SPIRIVA) 18 MCG inhalation capsule  Place 18 mcg into inhaler and inhale daily.     No current facility-administered medications for this visit.     Past Surgical History  Procedure Laterality Date  . Cabg  1993  . Coronary stent placement  July 2005, 2008    taxus stent of the distal right coronary artery   . Elbow surgery      right  . Tonsillectomy    . Back surgery    . Esophagogastroduodenoscopy  07/31/2012    Procedure: ESOPHAGOGASTRODUODENOSCOPY (EGD);  Surgeon: Brendan DareMalcolm T Stark, MD,FACG;  Location[facility];  Service: Endoscopy;  Laterality: N/A;  pt has food impaction, takes plavix, so no plans to dilate.  . Coronary artery bypass graft    . Esophagogastroduodenoscopy N/A 01/07/2014    Procedure: ESOPHAGOGASTRODUODENOSCOPY (EGD);  Surgeon: Brendan Meckelobert D Kaplan, MD;  [facility];  Service: Endoscopy;  Laterality: N/A;  . Balloon dilation N/A 01/07/2014    Procedure: BALLOON DILATION;  Surgeon: Brendan Meckelobert D Kaplan, MD;  Location: [facility] ENDOSCOPY;  Service: Endoscopy;  Laterality: N/A;  . Esophagogastroduodenoscopy N/A 03/09/2014    Procedure: ESOPHAGOGASTRODUODENOSCOPY (EGD);  Surgeon: Brendan Meckelobert D Kaplan, MD;  [facility];  Service: Endoscopy;  Laterality: N/A;  . Balloon dilation N/A 03/09/2014    Procedure: BALLOON DILATION;  Surgeon: Brendan Meckelobert D Kaplan, MD;  Location: [facility] ENDOSCOPY;  Service: Endoscopy;  Laterality: N/A;     No Known Allergies    Family History  Problem Relation Age of Onset  . Ovarian cancer Mother   . Diabetes Mother   . Diabetes Brother   . Heart disease Maternal Grandfather   . Colon cancer Neg Hx   . Esophageal cancer Neg Hx   . Stomach cancer Neg Hx      Social History [name] reports that he quit smoking about 4 years ago. He has never used smokeless tobacco. [name] reports that he drinks about 2.4 oz of alcohol per week.   Review of Systems CONSTITUTIONAL: No weight loss, fever, chills, weakness or fatigue.  HEENT: Eyes: No visual loss, blurred vision, double  vision or yellow sclerae.No hearing loss, sneezing, congestion, runny nose or sore throat.  SKIN: No rash or itching.  CARDIOVASCULAR: per HPI RESPIRATORY: No cough or sputum.  GASTROINTESTINAL: No anorexia, nausea, vomiting or diarrhea. No abdominal pain or blood.  GENITOURINARY: No burning on urination, no polyuria NEUROLOGICAL: No headache, dizziness, syncope, paralysis, ataxia, numbness or tingling in the extremities. No change in bowel or bladder control.  MUSCULOSKELETAL:+ leg swelling LYMPHATICS: No enlarged nodes. No history of splenectomy.  PSYCHIATRIC: No history of depression or anxiety.  ENDOCRINOLOGIC: No reports of sweating, cold or heat intolerance. No polyuria or polydipsia.  .Marland Kitchen  Physical Examination p 59 bp 125/78 Wt 204 lbs BMI 29 Gen: resting comfortably, no acute distress HEENT: no scleral icterus, pupils equal round and reactive, no palptable cervical adenopathy,  CV: RRR, no m/r/g, JVD to earlobe,  Resp: Clear to auscultation bilaterally GI: abdomen is soft, non-tender, non-distended, normal bowel sounds, no hepatosplenomegaly MSK: extremities are warm, 1+ bilateral edema  Skin: warm, no rash Neuro:  no focal deficits Psych: appropriate affect   Diagnostic Studies  Cath Jan 2010 HEMODYNAMIC DATA:  1. The central aortic pressure is 152/61, mean 93.  2. Left ventricular pressure 164/5.  3. There was no gradient on pullback across aortic valve.  ANGIOGRAPHIC DATA:  1. The left main is free of critical disease.  2. The left anterior descending artery is totally occluded after a  small diagonal.  3. The internal mammary to the diagonal and distal LAD appears to be  intact, with good runoff into both branches. There is disease  between the 2 branches.  4. The native circumflex is calcified. There is a first marginal that  has about 40% ostial narrowing, but is fairly small in caliber.  The AV circumflex after the takeoff of the second marginal  is  previously stented and there is no evidence of any significant  restenosis at this site. The second marginal itself has an  eccentric plaque of about 60% noted in the midportion of that  artery. It has been previously visualized, and compared to  previous study, I do not observe a substantial change. Distally,  the AV circumflex has about 50% eccentric bend leading into the  fairly large third marginal branch.  5. The right coronary artery has a 50-60% mid stenosis. The vessel is  then diffusely diseased distally, and there is competitive filling.  6. The vein graft at its inserts into the distal right covering the PD  and PLA system appears to be widely patent. The stent itself is  patent as well. There is maybe 40% in the continuation branch.  7. The ventriculogram by hand only suggests preserved LV function.  CONCLUSION:  1. Preserved overall left ventricular function.  2. Continued patency of the internal mammary to the diagonal LAD.  3. Continued patency of the saphenous vein graft to the distal right  with continued patency of the stent at its insertion.  4. Continued patency of the stent to the mid circumflex.  5. Moderate lesions of the OM-2 and distal AV circumflex as described  above.  DISPOSITION: I have carefully reviewed the old films and compared them  to the current studies. The circumflex OM lesions do not appear to be  substantially progressed. It potentially could be the source of  ischemia, but does not appear to be unstable as such, given the current  anatomy I am inclined to treat him medically at the present time. We  will see how he does first and I will see him back in followup in the  office soon.   04/2014 Carotid US Summary: Findings suggest upper range 1-39% internal carotid artery stenosis bilaterally. Vertebral arteries are patent with antegrade flow.  Other specific details can be found in the table(s) above. Prepared  and Electronically Authenticated by   01/2014 MPI Impression  Exercise Capacity: Lexiscan with no exercise.  BP Response: Normal blood pressure response.  Clinical Symptoms: No chest pain.  ECG Impression: No change from baseline pattern of widespread T wave inversion.  Comparison with Prior Nuclear Study: No images to compare  Overall  Impression: Low risk stress nuclear study. No evidence of ischemia or scar. No new EKG changes with stress. Normal LV systolic function.  LV Ejection Fraction: 70%. LV Wall Motion: Normal Wall Motion  04/2014 Echo Study Conclusions  - Left ventricle: The cavity size was normal. There was mild focal basal hypertrophy of the septum. Systolic function was normal. The estimated ejection fraction was in the range of 60% to 65%. Wall motion was normal; there were no regional wall motion abnormalities. There was an increased relative contribution of atrial contraction to ventricular filling. Doppler parameters are consistent with abnormal left ventricular relaxation (grade 1 diastolic dysfunction). - Mitral valve: Calcified annulus. There was trivial regurgitation. - Left atrium: The atrium was mildly dilated. - Right ventricle: The cavity size was moderately dilated. Wall thickness was normal. Systolic function was severely reduced. - Right atrium: The atrium was severely dilated. - Tricuspid valve: There was moderate regurgitation. - Pulmonic valve: There was trivial regurgitation. - Pulmonary arteries: PA peak pressure: 101 mm Hg (S).  Impressions:  - The right ventricular systolic pressure was increased consistent with severe pulmonary hypertension.   Assessment and Plan   1. CAD - Lexiscan MPI 01/2014 negative for ischemia - denies any recet chest pain, continue risk factor modification and secondary prevention  2. Carotid stenosis - mild bilateral disease by recent US, no symptoms - continue to follow clinically  3. Hyperlipidemia -  followed by pcp, no recent panel in our system. Continue current statin for now.   4 Chronic diastolic HF -  fragile balance given his diastolic dysfunction, RV failure, CKD, and issues with hypotension - recent admission after fall thought secondary to dehydration, diuretics decreased now only on prn weight gains. Continue lasix prn, recommend 40mg  once daily prn weight gain of 3 or more lbs, baseline weight of 201 lbs. He has repeat labs with his pcp tomorrow  5. Severe pulmonary hypertension with right sided dysfunction - cor pulmonale due to chronic severe COPD with chronic hypoxia, OSA, left isded dysfunction may also contribute some to elevated pulm pressures - continue home O2, continue diuretics. - with severe chronic hypoxia not a candidate for vasodilator therapy  6. COPD - continue home O2, continue to follow with pulm  7. OSA - continue home CPAP    F/u 4 months   Antoine Poche, M.D.

## 2014-08-18 ENCOUNTER — Ambulatory Visit (INDEPENDENT_AMBULATORY_CARE_PROVIDER_SITE_OTHER): Payer: Medicare Other | Admitting: Cardiology

## 2014-08-18 ENCOUNTER — Encounter: Payer: Self-pay | Admitting: Cardiology

## 2014-08-18 VITALS — BP 125/78 | HR 59 | Ht 70.0 in | Wt 204.0 lb

## 2014-08-18 DIAGNOSIS — I270 Primary pulmonary hypertension: Secondary | ICD-10-CM

## 2014-08-18 DIAGNOSIS — I5032 Chronic diastolic (congestive) heart failure: Secondary | ICD-10-CM

## 2014-08-18 DIAGNOSIS — E785 Hyperlipidemia, unspecified: Secondary | ICD-10-CM

## 2014-08-18 DIAGNOSIS — I272 Pulmonary hypertension, unspecified: Secondary | ICD-10-CM

## 2014-08-18 DIAGNOSIS — I2781 Cor pulmonale (chronic): Secondary | ICD-10-CM

## 2014-08-18 DIAGNOSIS — I6523 Occlusion and stenosis of bilateral carotid arteries: Secondary | ICD-10-CM

## 2014-08-18 DIAGNOSIS — I2510 Atherosclerotic heart disease of native coronary artery without angina pectoris: Secondary | ICD-10-CM

## 2014-08-18 DIAGNOSIS — I25810 Atherosclerosis of coronary artery bypass graft(s) without angina pectoris: Secondary | ICD-10-CM

## 2014-08-18 NOTE — Patient Instructions (Signed)
   Support stockings (low compression, below the knee) - script provided today. Continue all current medications. Follow up in  4 months

## 2014-11-30 ENCOUNTER — Ambulatory Visit (HOSPITAL_COMMUNITY)
Admission: RE | Admit: 2014-11-30 | Discharge: 2014-11-30 | Disposition: A | Discharge status: Home / Self Care | Payer: Medicare Other | Source: Ambulatory Visit | Attending: Ophthalmology | Admitting: Ophthalmology

## 2014-11-30 ENCOUNTER — Encounter (HOSPITAL_COMMUNITY): Payer: Self-pay | Admitting: *Deleted

## 2014-11-30 ENCOUNTER — Encounter (HOSPITAL_COMMUNITY)
Admission: RE | Disposition: A | Discharge status: Home / Self Care | Payer: Self-pay | Source: Ambulatory Visit | Attending: Ophthalmology

## 2014-11-30 DIAGNOSIS — H26491 Other secondary cataract, right eye: Secondary | ICD-10-CM | POA: Diagnosis not present

## 2014-11-30 HISTORY — PX: YAG LASER APPLICATION: SHX6189

## 2014-11-30 SURGERY — TREATMENT, USING YAG LASER
Anesthesia: LOCAL | Laterality: Right

## 2014-11-30 MED ORDER — APRACLONIDINE HCL 1 % OP SOLN
OPHTHALMIC | Status: AC
  Filled 2014-11-30: qty 0.1

## 2014-11-30 MED ORDER — TETRACAINE HCL 0.5 % OP SOLN
OPHTHALMIC | Status: AC
  Filled 2014-11-30: qty 2

## 2014-11-30 MED ORDER — TROPICAMIDE 1 % OP SOLN
1.0000 [drp] | OPHTHALMIC | Status: AC
  Administered 2014-11-30 (×3): 1 [drp] via OPHTHALMIC

## 2014-11-30 MED ORDER — APRACLONIDINE HCL 1 % OP SOLN
1.0000 [drp] | OPHTHALMIC | Status: AC
  Administered 2014-11-30: 1 [drp] via OPHTHALMIC
  Administered 2014-11-30 (×2): 2 [drp] via OPHTHALMIC

## 2014-11-30 MED ORDER — TROPICAMIDE 1 % OP SOLN
OPHTHALMIC | Status: AC
  Filled 2014-11-30: qty 3

## 2014-11-30 MED ORDER — TETRACAINE HCL 0.5 % OP SOLN
1.0000 [drp] | Freq: Once | OPHTHALMIC | Status: AC
  Administered 2014-11-30: 1 [drp] via OPHTHALMIC

## 2014-11-30 NOTE — Op Note (Signed)
Procedure- no op note required 

## 2014-11-30 NOTE — H&P (Signed)
I have reviewed the pre printed H&P, the patient was re-examined, and I have identified no significant interval changes in the patient's medical condition.  There is no change in the plan of care since the history and physical of record. 

## 2014-11-30 NOTE — Discharge Instructions (Signed)
Glendon AxeFranklin W Gentry  11/30/2014     Instructions    Activity: No Restrictions.   Diet: Resume Diet you were on at home.   Pain Medication: Tylenol if Needed.   CONTACT YOUR DOCTOR IF YOU HAVE PAIN, REDNESS IN YOUR EYE, OR DECREASED VISION.   Follow-up:12/22/14 at 1:30 pm with Susa Simmondsarroll F Haines, MD.     Dr. Lita MainsHaines: [phone]     If you find that you cannot contact your physician, but feel that your signs and   Symptoms warrant a physician's attention, call the Emergency Room at   [phone] ext.[phone].

## 2014-11-30 NOTE — Brief Op Note (Signed)
Glendon AxeFranklin W Cattell 11/30/2014  Susa Simmondsarroll F Valente Fosberg, MD  Pre-op Diagnosis:  secondary cataract right eye  Post-op Diagnosis: secondary cataract right eye  Yag laser self-test completed: Yes.    Indications:  See preprinted/scanned office H&P  Procedure:    YAG laser posterior capsulotomy OS  Eye protection worn by staff:  Yes.   Laser In Use sign on door:  Yes.    Laser:  Lumenis selecta Duet  Power Setting:  1.7 mJ/burst Anatomical site treated:  Posterior capsule OD Number of applications:  37 Total energy delivered: 59.33 mJ Results:  Good central capsular opening with clear visual axis  Patient verbalizes understanding of discharge instructions:  Yes.

## 2014-12-01 ENCOUNTER — Encounter (HOSPITAL_COMMUNITY): Payer: Self-pay | Admitting: Ophthalmology

## 2014-12-22 ENCOUNTER — Ambulatory Visit (INDEPENDENT_AMBULATORY_CARE_PROVIDER_SITE_OTHER): Payer: Medicare Other | Admitting: Cardiology

## 2014-12-22 ENCOUNTER — Encounter: Payer: Self-pay | Admitting: Cardiology

## 2014-12-22 VITALS — BP 124/62 | HR 81 | Ht 69.0 in | Wt 195.1 lb

## 2014-12-22 DIAGNOSIS — I270 Primary pulmonary hypertension: Secondary | ICD-10-CM

## 2014-12-22 DIAGNOSIS — I5032 Chronic diastolic (congestive) heart failure: Secondary | ICD-10-CM

## 2014-12-22 DIAGNOSIS — E785 Hyperlipidemia, unspecified: Secondary | ICD-10-CM | POA: Diagnosis not present

## 2014-12-22 DIAGNOSIS — J449 Chronic obstructive pulmonary disease, unspecified: Secondary | ICD-10-CM

## 2014-12-22 DIAGNOSIS — I2781 Cor pulmonale (chronic): Secondary | ICD-10-CM

## 2014-12-22 DIAGNOSIS — I272 Pulmonary hypertension, unspecified: Secondary | ICD-10-CM

## 2014-12-22 DIAGNOSIS — I2510 Atherosclerotic heart disease of native coronary artery without angina pectoris: Secondary | ICD-10-CM | POA: Diagnosis not present

## 2014-12-22 DIAGNOSIS — I6523 Occlusion and stenosis of bilateral carotid arteries: Secondary | ICD-10-CM | POA: Diagnosis not present

## 2014-12-22 MED ORDER — PREDNISONE 20 MG PO TABS
40.0000 mg | ORAL_TABLET | Freq: Every day | ORAL | Status: DC
Start: 2014-12-22 — End: 2015-02-25

## 2014-12-22 MED ORDER — AZITHROMYCIN 250 MG PO TABS: ORAL_TABLET | ORAL | Status: DC

## 2014-12-22 NOTE — Patient Instructions (Signed)
   Referral to Dr. Juanetta GoslingHawkins [place]   Begin Prednisone 20mg  - take 2 tabs (40mg ) daily x 5 days only  Begin Z-pack 250mg  - take as directed - 2 tabs (500mg ) on day one, then 1 tab (250mg ) daily till finish pack Continue all other medications.   Follow up in  2 months

## 2014-12-22 NOTE — Progress Notes (Signed)
Clinical Summary Mr. Mccreadie is a 81 y.o.male seen today for follow up of the following medical problems.   1. CAD - remote hx of angioplasty over 20 years ago. CABG in 1993. - last cath Jan 2010 LM patent, LAD occluded, LIMA-LAD patent, LCX patent, OM1 40 ostial, patent AV circumflex stent, OM2 60% and stable, RCA 50-60% mid. Patent SVG-RCA with patent stent at its insertion, LVEF normal.  - from notes he has been continued on longterm plavix as opposed to ASA, he is not sure why. [name] 01/2014 low risk, no evidence of ischemia or scar, LVEF 70%  - since our last visit he denies any significant chest pain. Chronic SOB/DOE related to his chronic lung disease which is overall stable.   2. Carotid stenosis - mild bilateral disease on last [place] 04/2014 - denies any neurological symptoms  3 . Hyperlipidemia - compliant with lipitor 20mg  daily  4. Chronic diastolic heart failure/Chronic right ventricular systolic dysfunction  - echo 04/2014 LVEF 60-65%, grade I diastolic dysfunction, severe RV dysfunction, moderate TR with PASP 101 - diuretics have been limited due to syncope thought related to orthostatic hypotension July 2015.  - recently changed to torsemide by Dr Fausto Skillern   5. Pulmonary HTN - echo 04/2014 PASP 101 mmHg, severe RV systolic dysfunction, grade I diastolic dysfunction - likely etiology is related to severe COPD and chronic hypoxia, he is on home oxygen - stable SOB  6. COPD - on chronic home oxygen - followed by Dr Orson Aloe pulmonary - cold like symptoms starting on Sunday. Heavy cough, increased sputum production. Light green. No fevers or chills. Increased SOB. Compliant with spiriva daily, uses albuterol prn.   7. OSA - just started on CPAP Past Medical History  Diagnosis Date  . Dyslipidemia   . HTN (hypertension)   . DM (diabetes mellitus)   . Carotid artery disease     a. Duplex 04/2014: suggest upper range 1-39% BICA.  Marland Kitchen CAD (coronary artery  disease)     a. s/p very remote angioplasty, CABG 1993. b. H/o DES to dRCA at anastamotic site of VG 2005, DES to OM 2008. c. Low risk nuc 01/2014.  . H/O: gout   . Diverticulitis   . Colon polyps   . Blood transfusion     '93  . GERD (gastroesophageal reflux disease)   . Hyperlipidemia   . Renal insufficiency     a. OP Cr 04/10/14: 1.32.  Marland Kitchen Stricture and stenosis of esophagus 11/06/2011    egd with esoph stricture dilation 11/2011.  [name]  . Esophageal reflux 11/06/2011  . COPD (chronic obstructive pulmonary disease)     a. Oxygen use 2l/m nasally usually nightly and during day as needed.     No Known Allergies   Current Outpatient Prescriptions  Medication Sig Dispense Refill  . atorvastatin (LIPITOR) 40 MG tablet Take 20 mg by mouth every morning.    . budesonide-formoterol (SYMBICORT) 160-4.5 MCG/ACT inhaler Inhale 2 puffs into the lungs 2 (two) times daily.    . clopidogrel (PLAVIX) 75 MG tablet Take 75 mg by mouth daily.    . Cyanocobalamin (VITAMIN B-12 IJ) Inject 1,000 mcg as directed every 30 (thirty) days.    Marland Kitchen esomeprazole (NEXIUM) 40 MG capsule Take 1 capsule (40 mg total) by mouth daily at 12 noon. 30 capsule 12  . gabapentin (NEURONTIN) 300 MG capsule Take 300 mg by mouth at bedtime.     Marland Kitchen glipiZIDE (GLUCOTROL XL) 5  MG 24 hr tablet Take 2.5 mg by mouth daily with breakfast.     . guaifenesin (HUMIBID E) 400 MG TABS tablet Take 400 mg by mouth 2 (two) times daily.    Marland Kitchen HYDROcodone-acetaminophen (NORCO/VICODIN) 5-325 MG per tablet Take 1 tablet by mouth every 6 (six) hours as needed for moderate pain.    . indomethacin (INDOCIN) 50 MG capsule Take 50 mg by mouth as needed.     . metoprolol (LOPRESSOR) 50 MG tablet Take 50 mg by mouth 2 (two) times daily.    . Multiple Vitamins-Minerals ([employer] SPECTRAVITE SENIOR) TABS Take 1 tablet by mouth daily.      . nitroGLYCERIN (NITROSTAT) 0.4 MG SL tablet Place 0.4 mg under the tongue every 5 (five) minutes as needed for chest  pain.    Marland Kitchen tiotropium (SPIRIVA) 18 MCG inhalation capsule Place 18 mcg into inhaler and inhale daily.    Marland Kitchen torsemide (DEMADEX) 20 MG tablet Take 20 mg by mouth 3 (three) times daily.     No current facility-administered medications for this visit.     Past Surgical History  Procedure Laterality Date  . Cabg  1993  . Coronary stent placement  July 2005, 2008    taxus stent of the distal right coronary artery   . Elbow surgery      right  . Tonsillectomy    . Back surgery    . Esophagogastroduodenoscopy  07/31/2012    Procedure: ESOPHAGOGASTRODUODENOSCOPY (EGD);  Surgeon: Meryl Dare, MD,FACG;  Location: [facility] ENDOSCOPY;  Service: Endoscopy;  Laterality: N/A;  pt has food impaction, takes plavix, so no plans to dilate.  . Coronary artery bypass graft    . Esophagogastroduodenoscopy N/A 01/07/2014    Procedure: ESOPHAGOGASTRODUODENOSCOPY (EGD);  Surgeon: Louis Meckel, MD;  Location: [facility] ENDOSCOPY;  Service: Endoscopy;  Laterality: N/A;  . Balloon dilation N/A 01/07/2014    Procedure: BALLOON DILATION;  Surgeon: Louis Meckel, MD;  Location: [facility] ENDOSCOPY;  Service: Endoscopy;  Laterality: N/A;  . Esophagogastroduodenoscopy N/A 03/09/2014    Procedure: ESOPHAGOGASTRODUODENOSCOPY (EGD);  Surgeon: Louis Meckel, MD;  Location: [facility] ENDOSCOPY;  Service: Endoscopy;  Laterality: N/A;  . Balloon dilation N/A 03/09/2014    Procedure: BALLOON DILATION;  Surgeon: Louis Meckel, MD;  Location: [facility] ENDOSCOPY;  Service: Endoscopy;  Laterality: N/A;  . Yag laser application Right 11/30/2014    Procedure: YAG LASER APPLICATION;  Surgeon: Susa Simmonds, MD;  Location: [facility] ORS;  Service: Ophthalmology;  Laterality: Right;     No Known Allergies    Family History  Problem Relation Age of Onset  . Ovarian cancer Mother   . Diabetes Mother   . Diabetes Brother   . Heart disease Maternal Grandfather   . Colon cancer Neg Hx   . Esophageal cancer Neg Hx   . Stomach cancer Neg Hx      Social  History Mr. Kotowski reports that he quit smoking about 5 years ago. He started smoking about 70 years ago. He has never used smokeless tobacco. Mr. Funke reports that he drinks about 2.4 oz of alcohol per week.   Review of Systems CONSTITUTIONAL: No weight loss, fever, chills, weakness or fatigue.  HEENT: Eyes: No visual loss, blurred vision, double vision or yellow sclerae.No hearing loss, sneezing, congestion, runny nose or sore throat.  SKIN: No rash or itching.  CARDIOVASCULAR: per HPI RESPIRATORY: No shortness of breath, cough or sputum.  GASTROINTESTINAL: No anorexia, nausea, vomiting or diarrhea. No abdominal pain or  blood.  GENITOURINARY: No burning on urination, no polyuria NEUROLOGICAL: No headache, dizziness, syncope, paralysis, ataxia, numbness or tingling in the extremities. No change in bowel or bladder control.  MUSCULOSKELETAL: No muscle, back pain, joint pain or stiffness.  LYMPHATICS: No enlarged nodes. No history of splenectomy.  PSYCHIATRIC: No history of depression or anxiety.  ENDOCRINOLOGIC: No reports of sweating, cold or heat intolerance. No polyuria or polydipsia.  Marland Kitchen.   Physical Examination p 81 bp 124/62 Wt 195 lbs BMI 29 Gen: resting comfortably, no acute distress HEENT: no scleral icterus, pupils equal round and reactive, no palptable cervical adenopathy,  CV: RRR, no m/r/g, no JVD, no carotid bruits Resp: coarse bilaterally, bilateral wheezing GI: abdomen is soft, non-tender, non-distended, normal bowel sounds, no hepatosplenomegaly MSK: extremities are warm, no edema.  Skin: warm, no rash Neuro:  no focal deficits Psych: appropriate affect   Diagnostic Studies  Cath Jan 2010 HEMODYNAMIC DATA:  1. The central aortic pressure is 152/61, mean 93.  2. Left ventricular pressure 164/5.  3. There was no gradient on pullback across aortic valve.  ANGIOGRAPHIC DATA:  1. The left main is free of critical disease.  2. The left anterior descending  artery is totally occluded after a  small diagonal.  3. The internal mammary to the diagonal and distal LAD appears to be  intact, with good runoff into both branches. There is disease  between the 2 branches.  4. The native circumflex is calcified. There is a first marginal that  has about 40% ostial narrowing, but is fairly small in caliber.  The AV circumflex after the takeoff of the second marginal is  previously stented and there is no evidence of any significant  restenosis at this site. The second marginal itself has an  eccentric plaque of about 60% noted in the midportion of that  artery. It has been previously visualized, and compared to  previous study, I do not observe a substantial change. Distally,  the AV circumflex has about 50% eccentric bend leading into the  fairly large third marginal Kately Graffam.  5. The right coronary artery has a 50-60% mid stenosis. The vessel is  then diffusely diseased distally, and there is competitive filling.  6. The vein graft at its inserts into the distal right covering the PD  and PLA system appears to be widely patent. The stent itself is  patent as well. There is maybe 40% in the continuation Arnecia Ector.  7. The ventriculogram by hand only suggests preserved LV function.  CONCLUSION:  1. Preserved overall left ventricular function.  2. Continued patency of the internal mammary to the diagonal LAD.  3. Continued patency of the saphenous vein graft to the distal right  with continued patency of the stent at its insertion.  4. Continued patency of the stent to the mid circumflex.  5. Moderate lesions of the OM-2 and distal AV circumflex as described  above.  DISPOSITION: I have carefully reviewed the old films and compared them  to the current studies. The circumflex OM lesions do not appear to be  substantially progressed. It potentially could be the source of  ischemia, but does not appear to be unstable as  such, given the current  anatomy I am inclined to treat him medically at the present time. We  will see how he does first and I will see him back in followup in the  office soon.   04/2014 Carotid US Summary: Findings suggest upper range 1-39% internal carotid artery stenosis  bilaterally. Vertebral arteries are patent with antegrade flow.  Other specific details can be found in the table(s) above. Prepared and Electronically Authenticated by   01/2014 MPI Impression  Exercise Capacity: Lexiscan with no exercise.  BP Response: Normal blood pressure response.  Clinical Symptoms: No chest pain.  ECG Impression: No change from baseline pattern of widespread T wave inversion.  Comparison with Prior Nuclear Study: No images to compare  Overall Impression: Low risk stress nuclear study. No evidence of ischemia or scar. No new EKG changes with stress. Normal LV systolic function.  LV Ejection Fraction: 70%. LV Wall Motion: Normal Wall Motion  04/2014 Echo Study Conclusions  - Left ventricle: The cavity size was normal. There was mild focal basal hypertrophy of the septum. Systolic function was normal. The estimated ejection fraction was in the range of 60% to 65%. Wall motion was normal; there were no regional wall motion abnormalities. There was an increased relative contribution of atrial contraction to ventricular filling. Doppler parameters are consistent with abnormal left ventricular relaxation (grade 1 diastolic dysfunction). - Mitral valve: Calcified annulus. There was trivial regurgitation. - Left atrium: The atrium was mildly dilated. - Right ventricle: The cavity size was moderately dilated. Wall thickness was normal. Systolic function was severely reduced. - Right atrium: The atrium was severely dilated. - Tricuspid valve: There was moderate regurgitation. - Pulmonic valve: There was trivial regurgitation. - Pulmonary arteries: PA peak pressure: 101 mm Hg  (S).  Impressions:  - The right ventricular systolic pressure was increased consistent with severe pulmonary hypertension.   Assessment and Plan  1. CAD - Lexiscan MPI 01/2014 negative for ischemia - denies any recet chest pain, continue risk factor modification and secondary prevention  2. Carotid stenosis - mild bilateral disease by recent US, no symptoms - continue to follow clinically  3. Hyperlipidemia - followed by pcp, no recent panel in our system. Continue current statin for now.   4 Chronic diastolic HF - fragile balance given his diastolic dysfunction, RV failure, CKD, and issues with hypotension - recently changed to toresmide by renal, continue current diuretic.   5. Severe pulmonary hypertension with right sided dysfunction - cor pulmonale due to chronic severe COPD with chronic hypoxia, OSA, left isded dysfunction may also contribute some to elevated pulm pressures - continue home O2, continue diuretics. - with severe chronic hypoxia not a candidate for vasodilator therapy  6. COPD - evidence of exacerbation given increased cough with increased sputum production, SOB, and wheezing - will give prednisone  x 5 days along with azithromycin. Counseled if symptoms progress to contact pcp or go to ER.  - continue home O2 - refer to Dr Juanetta Gosling for pulmonary issues  7. OSA - continue home CPAP    F/u 2 months   Antoine Poche, M.D.

## 2015-02-25 ENCOUNTER — Encounter: Payer: Self-pay | Admitting: Cardiology

## 2015-02-25 ENCOUNTER — Ambulatory Visit: Payer: Medicare Other | Admitting: Cardiology

## 2015-02-25 ENCOUNTER — Ambulatory Visit (INDEPENDENT_AMBULATORY_CARE_PROVIDER_SITE_OTHER): Payer: Medicare Other | Admitting: Cardiology

## 2015-02-25 VITALS — BP 131/64 | HR 56 | Ht 69.0 in | Wt 204.0 lb

## 2015-02-25 DIAGNOSIS — I5032 Chronic diastolic (congestive) heart failure: Secondary | ICD-10-CM

## 2015-02-25 DIAGNOSIS — I2510 Atherosclerotic heart disease of native coronary artery without angina pectoris: Secondary | ICD-10-CM

## 2015-02-25 DIAGNOSIS — I272 Pulmonary hypertension, unspecified: Secondary | ICD-10-CM

## 2015-02-25 DIAGNOSIS — E785 Hyperlipidemia, unspecified: Secondary | ICD-10-CM

## 2015-02-25 DIAGNOSIS — I6523 Occlusion and stenosis of bilateral carotid arteries: Secondary | ICD-10-CM

## 2015-02-25 DIAGNOSIS — I270 Primary pulmonary hypertension: Secondary | ICD-10-CM

## 2015-02-25 DIAGNOSIS — J449 Chronic obstructive pulmonary disease, unspecified: Secondary | ICD-10-CM | POA: Diagnosis not present

## 2015-02-25 DIAGNOSIS — I2781 Cor pulmonale (chronic): Secondary | ICD-10-CM

## 2015-02-25 MED ORDER — METOLAZONE 2.5 MG PO TABS: ORAL_TABLET | ORAL | Status: DC

## 2015-02-25 NOTE — Patient Instructions (Signed)
Your physician recommends that you schedule a follow-up appointment in: 2 WEEKS WITH DR. BRANCH  Your physician has recommended you make the following change in your medication:   START METOLAZONE 2.5 ONCE WEEKLY AS NEEDED FOR SHORTNESS OF BREATH/SWELLING  CONTINUE ALL OTHER MEDICATIONS AS DIRECTED  Your physician recommends that you return for lab work BMP  [facility]!!

## 2015-02-25 NOTE — Progress Notes (Signed)
Clinical Summary Brendan Arroyo is a 81 y.o.male seen today for follow up of the following medical problems.   1. CAD - remote hx of angioplasty over 20 years ago. CABG in 1993. - last cath Jan 2010 LM patent, LAD occluded, LIMA-LAD patent, LCX patent, OM1 40 ostial, patent AV circumflex stent, OM2 60% and stable, RCA 50-60% mid. Patent SVG-RCA with patent stent at its insertion, LVEF normal.  - from notes he has been continued on longterm plavix as opposed to ASA, he is not sure why. Brendan Arroyo 01/2014 low risk, no evidence of ischemia or scar, LVEF 70%  - since our last visit he denies any significant chest pain. Chronic SOB/DOE related to his chronic lung disease which is overall stable.   2. Carotid stenosis - mild bilateral disease on last [place] 04/2014 - denies any neurological symptoms  3 . Hyperlipidemia - compliant with lipitor  daily  4. Chronic diastolic heart failure/Chronic right ventricular systolic dysfunction  - echo 04/2014 LVEF 60-65%, grade I diastolic dysfunction, severe RV dysfunction, moderate TR with PASP 101 - diuretics previously have been limited due to syncope thought related to orthostatic hypotension July 2015.    - seen by pcp 02/18/15 with increasing LE edema and 7 lbs weight gain, increased SOB. Changed to toresmide  tid, alactone  bid started.. Cr on the 19th was 1.73, with increased diuretic on 02/22/15 Cr was 1.59. Na 129 and stable.   - since med change denies much change in swelling, weight or symptoms.   5. Pulmonary HTN - echo 04/2014 PASP 101 mmHg, severe RV systolic dysfunction, grade I diastolic dysfunction - likely etiology is related to severe COPD and chronic hypoxia, he is on home oxygen  6. COPD - on chronic home oxygen - followed by Dr Orson Aloe pulmonary - cold like symptoms starting on Sunday. Heavy cough, increased sputum production. Light green. No fevers or chills. Increased SOB. Compliant with spiriva daily, uses  albuterol prn.   7. OSA - compliant with CPAP    Past Medical History  Diagnosis Date  . Dyslipidemia   . HTN (hypertension)   . DM (diabetes mellitus)   . Carotid artery disease     a. Duplex 04/2014: suggest upper range 1-39% BICA.  Marland Kitchen CAD (coronary artery disease)     a. s/p very remote angioplasty, CABG 1993. b. H/o DES to dRCA at anastamotic site of VG 2005, DES to OM 2008. c. Low risk nuc 01/2014.  . H/O: gout   . Diverticulitis   . Colon polyps   . Blood transfusion     '93  . GERD (gastroesophageal reflux disease)   . Hyperlipidemia   . Renal insufficiency     a. OP Cr 04/10/14: 1.32.  Marland Kitchen Stricture and stenosis of esophagus 11/06/2011    egd with esoph stricture dilation 11/2011.  Brendan Heaps MD  . Esophageal reflux 11/06/2011  . COPD (chronic obstructive pulmonary disease)     a. Oxygen use 2l/m nasally usually nightly and during day as needed.     No Known Allergies   Current Outpatient Prescriptions  Medication Sig Dispense Refill  . atorvastatin (LIPITOR) 40 MG tablet Take 20 mg by mouth every morning.    Marland Kitchen azithromycin (ZITHROMAX Z-PAK) 250 MG tablet Take as directed 6 each 0  . clopidogrel (PLAVIX) 75 MG tablet Take 75 mg by mouth daily.    . Cyanocobalamin (VITAMIN B-12 IJ) Inject 1,000 mcg as directed every 30 (thirty) days.    Marland Kitchen  esomeprazole (NEXIUM) 40 MG capsule Take 1 capsule (40 mg total) by mouth daily at 12 noon. 30 capsule 12  . gabapentin (NEURONTIN) 300 MG capsule Take 300 mg by mouth at bedtime.     Marland Kitchen glipiZIDE (GLUCOTROL XL) 5 MG 24 hr tablet Take 2.5 mg by mouth daily with breakfast.     . guaifenesin (HUMIBID E) 400 MG TABS tablet Take 400 mg by mouth 2 (two) times daily.    Marland Kitchen HYDROcodone-acetaminophen (NORCO/VICODIN) 5-325 MG per tablet Take 1 tablet by mouth every 6 (six) hours as needed for moderate pain.    . indomethacin (INDOCIN) 50 MG capsule Take 50 mg by mouth as needed.     . metoprolol (LOPRESSOR) 50 MG tablet Take 50 mg by mouth 2  (two) times daily.    . Multiple Vitamins-Minerals ([employer] SPECTRAVITE SENIOR) TABS Take 1 tablet by mouth daily.      . nitroGLYCERIN (NITROSTAT) 0.4 MG SL tablet Place 0.4 mg under the tongue every 5 (five) minutes as needed for chest pain.    . predniSONE (DELTASONE) 20 MG tablet Take 2 tablets (40 mg total) by mouth daily. 10 tablet 0  . tiotropium (SPIRIVA) 18 MCG inhalation capsule Place 18 mcg into inhaler and inhale daily.    Marland Kitchen torsemide (DEMADEX) 20 MG tablet Take 20 mg by mouth 3 (three) times daily.     No current facility-administered medications for this visit.     Past Surgical History  Procedure Laterality Date  . Cabg  1993  . Coronary stent placement  July 2005, 2008    taxus stent of the distal right coronary artery   . Elbow surgery      right  . Tonsillectomy    . Back surgery    . Esophagogastroduodenoscopy  07/31/2012    Procedure: ESOPHAGOGASTRODUODENOSCOPY (EGD);  Surgeon: Meryl Dare, MD,FACG;  Location: [facility] ENDOSCOPY;  Service: Endoscopy;  Laterality: N/A;  pt has food impaction, takes plavix, so no plans to dilate.  . Coronary artery bypass graft    . Esophagogastroduodenoscopy N/A 01/07/2014    Procedure: ESOPHAGOGASTRODUODENOSCOPY (EGD);  Surgeon: Louis Meckel, MD;  Location: [facility] ENDOSCOPY;  Service: Endoscopy;  Laterality: N/A;  . Balloon dilation N/A 01/07/2014    Procedure: BALLOON DILATION;  Surgeon: Louis Meckel, MD;  Location: [facility] ENDOSCOPY;  Service: Endoscopy;  Laterality: N/A;  . Esophagogastroduodenoscopy N/A 03/09/2014    Procedure: ESOPHAGOGASTRODUODENOSCOPY (EGD);  Surgeon: Louis Meckel, MD;  Location: [facility] ENDOSCOPY;  Service: Endoscopy;  Laterality: N/A;  . Balloon dilation N/A 03/09/2014    Procedure: BALLOON DILATION;  Surgeon: Louis Meckel, MD;  Location: [facility] ENDOSCOPY;  Service: Endoscopy;  Laterality: N/A;  . Yag laser application Right 11/30/2014    Procedure: YAG LASER APPLICATION;  Surgeon: Susa Simmonds, MD;  Location: [facility] ORS;   Service: Ophthalmology;  Laterality: Right;     No Known Allergies    Family History  Problem Relation Age of Onset  . Ovarian cancer Mother   . Diabetes Mother   . Diabetes Brother   . Heart disease Maternal Grandfather   . Colon cancer Neg Hx   . Esophageal cancer Neg Hx   . Stomach cancer Neg Hx      Social History Mr. Binsfeld reports that he quit smoking about 5 years ago. He started smoking about 71 years ago. He has never used smokeless tobacco. Mr. Oblinger reports that he drinks about 2.4 oz of alcohol per week.   Review of  Systems CONSTITUTIONAL: No weight loss, fever, chills, weakness or fatigue.  HEENT: Eyes: No visual loss, blurred vision, double vision or yellow sclerae.No hearing loss, sneezing, congestion, runny nose or sore throat.  SKIN: No rash or itching.  CARDIOVASCULAR: per HPI RESPIRATORY: per HPI GASTROINTESTINAL: No anorexia, nausea, vomiting or diarrhea. No abdominal pain or blood.  GENITOURINARY: No burning on urination, no polyuria NEUROLOGICAL: No headache, dizziness, syncope, paralysis, ataxia, numbness or tingling in the extremities. No change in bowel or bladder control.  MUSCULOSKELETAL: + leg swelling LYMPHATICS: No enlarged nodes. No history of splenectomy.  PSYCHIATRIC: No history of depression or anxiety.  ENDOCRINOLOGIC: No reports of sweating, cold or heat intolerance. No polyuria or polydipsia.  Marland Kitchen   Physical Examination Filed Vitals:   02/25/15 1046  BP: 131/64  Pulse: 56   Filed Vitals:   02/25/15 1046  Height: 5\' 9"  (1.753 m)  Weight: 204 lb (92.534 kg)    Gen: resting comfortably, no acute distress HEENT: no scleral icterus, pupils equal round and reactive, no palptable cervical adenopathy,  CV: RRR, 2/6 systolic murmur LLSB, JVD to angle of jaw Resp: Clear to auscultation bilaterally GI: abdomen is soft, non-tender, non-distended, normal bowel sounds, no hepatosplenomegaly MSK: extremities are warm, 2+ bilateral edema    Skin: warm, no rash Neuro:  no focal deficits Psych: appropriate affect   Diagnostic Studies  Cath Jan 2010 HEMODYNAMIC DATA:  1. The central aortic pressure is 152/61, mean 93.  2. Left ventricular pressure 164/5.  3. There was no gradient on pullback across aortic valve.  ANGIOGRAPHIC DATA:  1. The left main is free of critical disease.  2. The left anterior descending artery is totally occluded after a  small diagonal.  3. The internal mammary to the diagonal and distal LAD appears to be  intact, with good runoff into both branches. There is disease  between the 2 branches.  4. The native circumflex is calcified. There is a first marginal that  has about 40% ostial narrowing, but is fairly small in caliber.  The AV circumflex after the takeoff of the second marginal is  previously stented and there is no evidence of any significant  restenosis at this site. The second marginal itself has an  eccentric plaque of about 60% noted in the midportion of that  artery. It has been previously visualized, and compared to  previous study, I do not observe a substantial change. Distally,  the AV circumflex has about 50% eccentric bend leading into the  fairly large third marginal Arroyo.  5. The right coronary artery has a 50-60% mid stenosis. The vessel is  then diffusely diseased distally, and there is competitive filling.  6. The vein graft at its inserts into the distal right covering the PD  and PLA system appears to be widely patent. The stent itself is  patent as well. There is maybe 40% in the continuation Arroyo.  7. The ventriculogram by hand only suggests preserved LV function.  CONCLUSION:  1. Preserved overall left ventricular function.  2. Continued patency of the internal mammary to the diagonal LAD.  3. Continued patency of the saphenous vein graft to the distal right  with continued patency of the stent at its insertion.  4. Continued  patency of the stent to the mid circumflex.  5. Moderate lesions of the OM-2 and distal AV circumflex as described  above.  DISPOSITION: I have carefully reviewed the old films and compared them  to the current studies. The circumflex  OM lesions do not appear to be  substantially progressed. It potentially could be the source of  ischemia, but does not appear to be unstable as such, given the current  anatomy I am inclined to treat him medically at the present time. We  will see how he does first and I will see him back in followup in the  office soon.   04/2014 Carotid US Summary: Findings suggest upper range 1-39% internal carotid artery stenosis bilaterally. Vertebral arteries are patent with antegrade flow.  Other specific details can be found in the table(s) above. Prepared and Electronically Authenticated by   01/2014 MPI Impression  Exercise Capacity: Lexiscan with no exercise.  BP Response: Normal blood pressure response.  Clinical Symptoms: No chest pain.  ECG Impression: No change from baseline pattern of widespread T wave inversion.  Comparison with Prior Nuclear Study: No images to compare  Overall Impression: Low risk stress nuclear study. No evidence of ischemia or scar. No new EKG changes with stress. Normal LV systolic function.  LV Ejection Fraction: 70%. LV Wall Motion: Normal Wall Motion  04/2014 Echo Study Conclusions  - Left ventricle: The cavity size was normal. There was mild focal basal hypertrophy of the septum. Systolic function was normal. The estimated ejection fraction was in the range of 60% to 65%. Wall motion was normal; there were no regional wall motion abnormalities. There was an increased relative contribution of atrial contraction to ventricular filling. Doppler parameters are consistent with abnormal left ventricular relaxation (grade 1 diastolic dysfunction). - Mitral valve: Calcified annulus. There was trivial  regurgitation. - Left atrium: The atrium was mildly dilated. - Right ventricle: The cavity size was moderately dilated. Wall thickness was normal. Systolic function was severely reduced. - Right atrium: The atrium was severely dilated. - Tricuspid valve: There was moderate regurgitation. - Pulmonic valve: There was trivial regurgitation. - Pulmonary arteries: PA peak pressure: 101 mm Hg (S).  Impressions:  - The right ventricular systolic pressure was increased consistent with severe pulmonary hypertension.     Assessment and Plan  1. CAD - Lexiscan MPI 01/2014 negative for ischemia - denies any recet chest pain, continue risk factor modification and secondary prevention  2. Carotid stenosis - mild bilateral disease by recent US, no symptoms - continue to follow clinically  3. Hyperlipidemia - continue current statin  4. Severe pulmonary hypertension with right sided dysfunction - cor pulmonale due to chronic severe COPD with chronic hypoxia, OSA, left isded dysfunction may also contribute some to elevated pulm pressures - continue home O2 - will start metolazone 2.5mg  q week prn swelling/weight gain. Continue to follow electrolytes closely with repeat BMET and Mg in 1 weeks.  - with severe chronic hypoxia not a candidate for vasodilator therapy as this would increase perfusion of hypoventilated areas and increase hypoxia. Main focus is symptoms management with diuretics, may need consideration for paracentesis.   6. COPD - management per pulmonary  7. OSA - continue home CPAP      Antoine PocheJonathan F. Arroyo, M.D.,

## 2015-03-11 ENCOUNTER — Ambulatory Visit: Payer: Medicare Other | Admitting: Cardiology

## 2015-03-25 ENCOUNTER — Encounter: Payer: Self-pay | Admitting: *Deleted

## 2015-03-25 ENCOUNTER — Ambulatory Visit (INDEPENDENT_AMBULATORY_CARE_PROVIDER_SITE_OTHER): Payer: Medicare Other | Admitting: Cardiology

## 2015-03-25 ENCOUNTER — Encounter: Payer: Self-pay | Admitting: Cardiology

## 2015-03-25 VITALS — BP 102/58 | HR 63 | Ht 69.0 in | Wt 199.0 lb

## 2015-03-25 DIAGNOSIS — I270 Primary pulmonary hypertension: Secondary | ICD-10-CM

## 2015-03-25 DIAGNOSIS — R188 Other ascites: Secondary | ICD-10-CM

## 2015-03-25 DIAGNOSIS — I272 Pulmonary hypertension, unspecified: Secondary | ICD-10-CM

## 2015-03-25 DIAGNOSIS — I5032 Chronic diastolic (congestive) heart failure: Secondary | ICD-10-CM

## 2015-03-25 DIAGNOSIS — I2781 Cor pulmonale (chronic): Secondary | ICD-10-CM | POA: Diagnosis not present

## 2015-03-25 DIAGNOSIS — I6523 Occlusion and stenosis of bilateral carotid arteries: Secondary | ICD-10-CM | POA: Diagnosis not present

## 2015-03-25 NOTE — Progress Notes (Signed)
Clinical Summary Brendan Arroyo is a 81 y.o.male seen today for follow up of the following medical problems. This is a focused visit on his history of right sided heart failure/ LV diastolic heart failure. For more detailed medical history refer to previous clinic notes.   1. Chronic diastolic heart failure/Chronic right ventricular systolic dysfunction  - echo 04/2014 LVEF 60-65%, grade I diastolic dysfunction, severe RV dysfunction, moderate TR with PASP 101 - diuretics previously have been limited due to syncope thought related to orthostatic hypotension July 2015.   - last visit had significant edema and weight gain. His pcp had increased his toresemide to  bid, I started him on once weekyl metolazone 2.5mg  prn swelling. Weight has come down 5 lbs, swelling improving but not resolved. He continues to complain mainly of abdominal distension.   2. Pulmonary HTN - echo 04/2014 PASP 101 mmHg, severe RV systolic dysfunction, grade I diastolic dysfunction - likely etiology is related to severe COPD and chronic hypoxia, he is on home oxygen    Past Medical History  Diagnosis Date  . Dyslipidemia   . HTN (hypertension)   . DM (diabetes mellitus)   . Carotid artery disease     a. Duplex 04/2014: suggest upper range 1-39% BICA.  Marland Kitchen CAD (coronary artery disease)     a. s/p very remote angioplasty, CABG 1993. b. H/o DES to dRCA at anastamotic site of VG 2005, DES to OM 2008. c. Low risk nuc 01/2014.  . H/O: gout   . Diverticulitis   . Colon polyps   . Blood transfusion     '93  . GERD (gastroesophageal reflux disease)   . Hyperlipidemia   . Renal insufficiency     a. OP Cr 04/10/14: 1.32.  Marland Kitchen Stricture and stenosis of esophagus 11/06/2011    egd with esoph stricture dilation 11/2011.  [name]  . Esophageal reflux 11/06/2011  . COPD (chronic obstructive pulmonary disease)     a. Oxygen use 2l/m nasally usually nightly and during day as needed.     No Known Allergies   Current  Outpatient Prescriptions  Medication Sig Dispense Refill  . atorvastatin (LIPITOR) 40 MG tablet Take 20 mg by mouth every morning.    . clopidogrel (PLAVIX) 75 MG tablet Take 75 mg by mouth daily.    . Cyanocobalamin (VITAMIN B-12 IJ) Inject 1,000 mcg as directed every 30 (thirty) days.    Marland Kitchen esomeprazole (NEXIUM) 40 MG capsule Take 1 capsule (40 mg total) by mouth daily at 12 noon. (Patient taking differently: Take 40 mg by mouth daily as needed. ) 30 capsule 12  . gabapentin (NEURONTIN) 300 MG capsule Take 300 mg by mouth at bedtime.     Marland Kitchen glipiZIDE (GLUCOTROL XL) 5 MG 24 hr tablet Take 2.5 mg by mouth daily with breakfast.     . guaifenesin (HUMIBID E) 400 MG TABS tablet Take 400 mg by mouth 2 (two) times daily.    Marland Kitchen HYDROcodone-acetaminophen (NORCO/VICODIN) 5-325 MG per tablet Take 1 tablet by mouth every 6 (six) hours as needed for moderate pain.    . indomethacin (INDOCIN) 50 MG capsule Take 50 mg by mouth as needed.     . metolazone (ZAROXOLYN) 2.5 MG tablet TAKE 1 TIME WEEKLY AS NEEDED FOR SHORTNESS OF BREATH OR SWELLING 30 tablet 3  . metoprolol (LOPRESSOR) 50 MG tablet Take 50 mg by mouth 2 (two) times daily.    . Multiple Vitamins-Minerals ([employer] SPECTRAVITE SENIOR) TABS Take 1  tablet by mouth daily.      . nitroGLYCERIN (NITROSTAT) 0.4 MG SL tablet Place 0.4 mg under the tongue every 5 (five) minutes as needed for chest pain.    Marland Kitchen spironolactone (ALDACTONE) 25 MG tablet Take 25 mg by mouth 2 (two) times daily.    Marland Kitchen tiotropium (SPIRIVA) 18 MCG inhalation capsule Place 18 mcg into inhaler and inhale daily.    Marland Kitchen torsemide (DEMADEX) 20 MG tablet Take 40 mg by mouth 3 (three) times daily.      No current facility-administered medications for this visit.     Past Surgical History  Procedure Laterality Date  . Cabg  1993  . Coronary stent placement  July 2005, 2008    taxus stent of the distal right coronary artery   . Elbow surgery      right  . Tonsillectomy    . Back surgery    .  Esophagogastroduodenoscopy  07/31/2012    Procedure: ESOPHAGOGASTRODUODENOSCOPY (EGD);  Surgeon: Meryl Dare, MD,FACG;  Location: [facility] ENDOSCOPY;  Service: Endoscopy;  Laterality: N/A;  pt has food impaction, takes plavix, so no plans to dilate.  . Coronary artery bypass graft    . Esophagogastroduodenoscopy N/A 01/07/2014    Procedure: ESOPHAGOGASTRODUODENOSCOPY (EGD);  Surgeon: Louis Meckel, MD;  Location: [facility] ENDOSCOPY;  Service: Endoscopy;  Laterality: N/A;  . Balloon dilation N/A 01/07/2014    Procedure: BALLOON DILATION;  Surgeon: Louis Meckel, MD;  Location: [facility] ENDOSCOPY;  Service: Endoscopy;  Laterality: N/A;  . Esophagogastroduodenoscopy N/A 03/09/2014    Procedure: ESOPHAGOGASTRODUODENOSCOPY (EGD);  Surgeon: Louis Meckel, MD;  Location: [facility] ENDOSCOPY;  Service: Endoscopy;  Laterality: N/A;  . Balloon dilation N/A 03/09/2014    Procedure: BALLOON DILATION;  Surgeon: Louis Meckel, MD;  Location: [facility] ENDOSCOPY;  Service: Endoscopy;  Laterality: N/A;  . Yag laser application Right 11/30/2014    Procedure: YAG LASER APPLICATION;  Surgeon: Susa Simmonds, MD;  Location: [facility] ORS;  Service: Ophthalmology;  Laterality: Right;     No Known Allergies    Family History  Problem Relation Age of Onset  . Ovarian cancer Mother   . Diabetes Mother   . Diabetes Brother   . Heart disease Maternal Grandfather   . Colon cancer Neg Hx   . Esophageal cancer Neg Hx   . Stomach cancer Neg Hx      Social History Mr. Pajak reports that he quit smoking about 5 years ago. He started smoking about 71 years ago. He has never used smokeless tobacco. Mr. Hugel reports that he drinks about 2.4 oz of alcohol per week.   Review of Systems CONSTITUTIONAL: No weight loss, fever, chills, weakness or fatigue.  HEENT: Eyes: No visual loss, blurred vision, double vision or yellow sclerae.No hearing loss, sneezing, congestion, runny nose or sore throat.  SKIN: No rash or itching.  CARDIOVASCULAR: no  chest pain, no palpitations.  RESPIRATORY: No cough or sputum.  GASTROINTESTINAL: No anorexia, nausea, vomiting or diarrhea. No abdominal pain or blood.  GENITOURINARY: No burning on urination, no polyuria NEUROLOGICAL: No headache, dizziness, syncope, paralysis, ataxia, numbness or tingling in the extremities. No change in bowel or bladder control.  MUSCULOSKELETAL: + leg swelling LYMPHATICS: No enlarged nodes. No history of splenectomy.  PSYCHIATRIC: No history of depression or anxiety.  ENDOCRINOLOGIC: No reports of sweating, cold or heat intolerance. No polyuria or polydipsia.  Marland Kitchen   Physical Examination Filed Vitals:   03/25/15 1129  BP: 102/58  Pulse: 63  Filed Vitals:   03/25/15 1129  Height:  (1.753 m)  Weight: 199 lb (90.266 kg)    Gen: resting comfortably, no acute distress HEENT: no scleral icterus, pupils equal round and reactive, no palptable cervical adenopathy,  CV: RRR, no m/r/g, no JVD Resp: Clear to auscultation bilaterally GI: abdomen is soft, non-tender, non-distended, normal bowel sounds, no hepatosplenomegaly MSK: extremities are warm, 1+ bilateral LE edema Skin: warm, no rash Neuro:  no focal deficits Psych: appropriate affect   Diagnostic Studies Cath Jan 2010 HEMODYNAMIC DATA:  1. The central aortic pressure is 152/61, mean 93.  2. Left ventricular pressure 164/5.  3. There was no gradient on pullback across aortic valve.  ANGIOGRAPHIC DATA:  1. The left main is free of critical disease.  2. The left anterior descending artery is totally occluded after a  small diagonal.  3. The internal mammary to the diagonal and distal LAD appears to be  intact, with good runoff into both branches. There is disease  between the 2 branches.  4. The native circumflex is calcified. There is a first marginal that  has about 40% ostial narrowing, but is fairly small in caliber.  The AV circumflex after the takeoff of the second marginal is   previously stented and there is no evidence of any significant  restenosis at this site. The second marginal itself has an  eccentric plaque of about 60% noted in the midportion of that  artery. It has been previously visualized, and compared to  previous study, I do not observe a substantial change. Distally,  the AV circumflex has about 50% eccentric bend leading into the  fairly large third marginal Ulysse Siemen.  5. The right coronary artery has a 50-60% mid stenosis. The vessel is  then diffusely diseased distally, and there is competitive filling.  6. The vein graft at its inserts into the distal right covering the PD  and PLA system appears to be widely patent. The stent itself is  patent as well. There is maybe 40% in the continuation [place].  7. The ventriculogram by hand only suggests preserved LV function.  CONCLUSION:  1. Preserved overall left ventricular function.  2. Continued patency of the internal mammary to the diagonal LAD.  3. Continued patency of the saphenous vein graft to the distal right  with continued patency of the stent at its insertion.  4. Continued patency of the stent to the mid circumflex.  5. Moderate lesions of the OM-2 and distal AV circumflex as described  above.  DISPOSITION: I have carefully reviewed the old films and compared them  to the current studies. The circumflex OM lesions do not appear to be  substantially progressed. It potentially could be the source of  ischemia, but does not appear to be unstable as such, given the current  anatomy I am inclined to treat him medically at the present time. We  will see how he does first and I will see him back in followup in the  office soon.   04/2014 Carotid US Summary: Findings suggest upper range 1-39% internal carotid artery stenosis bilaterally. Vertebral arteries are patent with antegrade flow.  Other specific details can be found in the table(s) above. Prepared and  Electronically Authenticated by   01/2014 MPI Impression  Exercise Capacity: Lexiscan with no exercise.  BP Response: Normal blood pressure response.  Clinical Symptoms: No chest pain.  ECG Impression: No change from baseline pattern of widespread T wave inversion.  Comparison with Prior Nuclear  Study: No images to compare  Overall Impression: Low risk stress nuclear study. No evidence of ischemia or scar. No new EKG changes with stress. Normal LV systolic function.  LV Ejection Fraction: 70%. LV Wall Motion: Normal Wall Motion  04/2014 Echo Study Conclusions  - Left ventricle: The cavity size was normal. There was mild focal basal hypertrophy of the septum. Systolic function was normal. The estimated ejection fraction was in the range of 60% to 65%. Wall motion was normal; there were no regional wall motion abnormalities. There was an increased relative contribution of atrial contraction to ventricular filling. Doppler parameters are consistent with abnormal left ventricular relaxation (grade 1 diastolic dysfunction). - Mitral valve: Calcified annulus. There was trivial regurgitation. - Left atrium: The atrium was mildly dilated. - Right ventricle: The cavity size was moderately dilated. Wall thickness was normal. Systolic function was severely reduced. - Right atrium: The atrium was severely dilated. - Tricuspid valve: There was moderate regurgitation. - Pulmonic valve: There was trivial regurgitation. - Pulmonary arteries: PA peak pressure: 101 mm Hg (S).  Impressions:  - The right ventricular systolic pressure was increased consistent with severe pulmonary hypertension.       Assessment and Plan   1. Severe pulmonary hypertension with right sided dysfunction/Chronic diastolic heart failure - cor pulmonale due to chronic severe COPD with chronic hypoxia, OSA, left isded dysfunction may also contribute some to elevated pulm pressures - continue home O2 - careful  diuresis given borderline renal function and prior orthostatic symptoms. Weight down 5 lbs on current regimen over the last 3-4 weeks, would continue current regimen - treatment options fairly limited. With severe chronic hypoxia not a candidate for vasodilator therapy as this would increase perfusion of hypoventilated areas and increase hypoxia. Main focus is symptoms management with diuretics,  - he complains of worsening abdominal distension, we will refer him for evaluation for therapeutic paracentesis.      F/u 1 month. Has upcoming labs with pcp, f/u results   Antoine Poche, M.D.

## 2015-03-25 NOTE — Patient Instructions (Signed)
Your physician recommends that you schedule a follow-up appointment in: 1 month with Dr. Wyline Mood  Your physician recommends that you continue on your current medications as directed. Please refer to the Current Medication list given to you today.  You have been referred to GASTROENTEROLOGY   WE WILL REQUEST NOTES FROM DR. BEFAKADU AND DR. NYLAND  Thank you for choosing [facility]!!

## 2015-03-30 ENCOUNTER — Encounter: Payer: Self-pay | Admitting: Internal Medicine

## 2015-04-15 ENCOUNTER — Encounter: Payer: Self-pay | Admitting: Gastroenterology

## 2015-04-15 ENCOUNTER — Other Ambulatory Visit: Payer: Self-pay

## 2015-04-15 ENCOUNTER — Ambulatory Visit (INDEPENDENT_AMBULATORY_CARE_PROVIDER_SITE_OTHER): Payer: Medicare Other | Admitting: Gastroenterology

## 2015-04-15 VITALS — BP 101/55 | HR 46 | Temp 97.8°F | Ht 70.0 in | Wt 192.6 lb

## 2015-04-15 DIAGNOSIS — R1900 Intra-abdominal and pelvic swelling, mass and lump, unspecified site: Secondary | ICD-10-CM | POA: Diagnosis not present

## 2015-04-15 DIAGNOSIS — I6523 Occlusion and stenosis of bilateral carotid arteries: Secondary | ICD-10-CM

## 2015-04-15 DIAGNOSIS — R188 Other ascites: Secondary | ICD-10-CM

## 2015-04-15 NOTE — Patient Instructions (Signed)
We have scheduled you for an ultrasound to see if you have fluid in your belly. This will also check your liver.   Further recommendations to follow!

## 2015-04-15 NOTE — Progress Notes (Signed)
Primary Care Physician:  [name], MD Primary Gastroenterologist:  Has previously seen Dr. Arlyce Dice but easier to come here. Dr. Jena Gauss   Chief Complaint  Patient presents with  . Ascites    HPI:   Brendan Arroyo is a 81 y.o. male presenting today  referred by Dr. Wyline Mood. States chronic abdominal swelling. Has mentioned in to PCP before. Wondered if he could have it drawn off. No history of liver disease. No pain. Gone from 36-44 pant size. No fever/chills. Good appetite. Some reflux recently. Takes Tums. Rare.   Previously has seen Dr. Arlyce Dice but easier to come here. History of multiple dilations in past secondary to esophageal strictures. No concerning lower GI symptoms. Seen routinely by Dr. Wyline Mood secondary to chronic diastolic heart failure.   Past Medical History  Diagnosis Date  . Dyslipidemia   . HTN (hypertension)   . DM (diabetes mellitus)   . Carotid artery disease     a. Duplex 04/2014: suggest upper range 1-39% BICA.  Marland Kitchen CAD (coronary artery disease)     a. s/p very remote angioplasty, CABG 1993. b. H/o DES to dRCA at anastamotic site of VG 2005, DES to OM 2008. c. Low risk nuc 01/2014.  . H/O: gout   . Diverticulitis   . Colon polyps   . Blood transfusion     '93  . GERD (gastroesophageal reflux disease)   . Hyperlipidemia   . Renal insufficiency     a. OP Cr 04/10/14: 1.32.  Marland Kitchen Stricture and stenosis of esophagus 11/06/2011    egd with esoph stricture dilation 11/2011.  [name]  . Esophageal reflux 11/06/2011  . COPD (chronic obstructive pulmonary disease)     a. Oxygen use 2l/m nasally usually nightly and during day as needed.    Past Surgical History  Procedure Laterality Date  . Cabg  1993  . Coronary stent placement  July 2005, 2008    taxus stent of the distal right coronary artery   . Elbow surgery      right  . Tonsillectomy    . Back surgery    . Esophagogastroduodenoscopy  07/31/2012    Procedure:  ESOPHAGOGASTRODUODENOSCOPY (EGD);  Surgeon: Meryl Dare, MD,FACG;  Location: [facility] ENDOSCOPY;  Service: Endoscopy;  Laterality: N/A;  pt has food impaction, takes plavix, so no plans to dilate.  . Coronary artery bypass graft    . Esophagogastroduodenoscopy N/A 01/07/2014    Procedure: ESOPHAGOGASTRODUODENOSCOPY (EGD);  Surgeon: Louis Meckel, MD;  Location: [facility] ENDOSCOPY;  Service: Endoscopy;  Laterality: N/A;  . Balloon dilation N/A 01/07/2014    Procedure: BALLOON DILATION;  Surgeon: Louis Meckel, MD;  Location: [facility] ENDOSCOPY;  Service: Endoscopy;  Laterality: N/A;  . Esophagogastroduodenoscopy N/A 03/09/2014    Procedure: ESOPHAGOGASTRODUODENOSCOPY (EGD);  Surgeon: Louis Meckel, MD;  Location: [facility] ENDOSCOPY;  Service: Endoscopy;  Laterality: N/A;  . Balloon dilation N/A 03/09/2014    Procedure: BALLOON DILATION;  Surgeon: Louis Meckel, MD;  Location: [facility] ENDOSCOPY;  Service: Endoscopy;  Laterality: N/A;  . Yag laser application Right 11/30/2014    Procedure: YAG LASER APPLICATION;  Surgeon: Susa Simmonds, MD;  Location: [facility] ORS;  Service: Ophthalmology;  Laterality: Right;    Current Outpatient Prescriptions  Medication Sig Dispense Refill  . atorvastatin (LIPITOR) 40 MG tablet Take 20 mg by mouth every morning.    . budesonide-formoterol (SYMBICORT) 160-4.5 MCG/ACT inhaler Inhale 2 puffs into the lungs 2 (two) times daily.    Marland Kitchen  clopidogrel (PLAVIX) 75 MG tablet Take 75 mg by mouth daily.    . Cyanocobalamin (VITAMIN B-12 IJ) Inject 1,000 mcg as directed every 30 (thirty) days.    Marland Kitchen. esomeprazole (NEXIUM) 40 MG capsule Take 1 capsule (40 mg total) by mouth daily at 12 noon. (Patient taking differently: Take 40 mg by mouth daily as needed. ) 30 capsule 12  . gabapentin (NEURONTIN) 300 MG capsule Take 150 mg by mouth at bedtime.     Marland Kitchen. glipiZIDE (GLUCOTROL XL) 5 MG 24 hr tablet Take 2.5 mg by mouth daily with breakfast.     . guaifenesin (HUMIBID E) 400 MG TABS tablet Take 400 mg by mouth 2  (two) times daily.    Marland Kitchen. HYDROcodone-acetaminophen (NORCO/VICODIN) 5-325 MG per tablet Take 1 tablet by mouth every 6 (six) hours as needed for moderate pain.    . indomethacin (INDOCIN) 50 MG capsule Take 50 mg by mouth as needed.     . metolazone (ZAROXOLYN) 2.5 MG tablet TAKE 1 TIME WEEKLY AS NEEDED FOR SHORTNESS OF BREATH OR SWELLING 30 tablet 3  . metoprolol (LOPRESSOR) 50 MG tablet Take 50 mg by mouth 2 (two) times daily.    . Multiple Vitamins-Minerals ([employer] SPECTRAVITE SENIOR) TABS Take 1 tablet by mouth daily.      . nitroGLYCERIN (NITROSTAT) 0.4 MG SL tablet Place 0.4 mg under the tongue every 5 (five) minutes as needed for chest pain.    Marland Kitchen. Potassium Chloride ER 20 MEQ TBCR Take 20 mEq by mouth 2 (two) times daily.    Marland Kitchen. spironolactone (ALDACTONE) 25 MG tablet Take 25 mg by mouth 2 (two) times daily.    Marland Kitchen. tiotropium (SPIRIVA) 18 MCG inhalation capsule Place 18 mcg into inhaler and inhale daily.    Marland Kitchen. torsemide (DEMADEX) 20 MG tablet Take 40 mg by mouth 2 (two) times daily.      No current facility-administered medications for this visit.    Allergies as of 04/15/2015  . (No Known Allergies)    Family History  Problem Relation Age of Onset  . Ovarian cancer Mother   . Diabetes Mother   . Diabetes Brother   . Heart disease Maternal Grandfather   . Colon cancer Neg Hx   . Esophageal cancer Neg Hx   . Stomach cancer Neg Hx     History   Social History  . Marital Status: Widowed    Spouse Name: N/A  . Number of Children: 1  . Years of Education: N/A   Occupational History  . retired    Social History Main Topics  . Smoking status: Former Smoker -- 1.00 packs/day for 66 years    Types: Cigarettes    Start date: 02/25/1944    Quit date: 10/02/2009  . Smokeless tobacco: Never Used     Comment: Quit 2011?. has a 40 pack-year history   . Alcohol Use: 2.4 oz/week    4 Shots of liquor per week     Comment: social -occ.  . Drug Use: No  . Sexual Activity: Not on file    Other Topics Concern  . Not on file   Social History Narrative   [place] in [place] and is retired. Tries to eat a heart health diet and exercises regularly.     Review of Systems: Gen: see HPI CV: Denies chest pain, heart palpitations, peripheral edema, syncope.  Resp: +DOE GI: see HPI GU : Denies urinary burning, urinary frequency, urinary hesitancy MS: occasional knee pain  Derm: Denies rash,  itching, dry skin Psych: Denies depression, anxiety, memory loss, and confusion  Heme: Denies bruising, bleeding, and enlarged lymph nodes.  Physical Exam: BP 101/55 mmHg  Pulse 46  Temp(Src) 97.8 F (36.6 C) (Oral)  Ht 5\' 10"  (1.778 m)  Wt 192 lb 9.6 oz (87.363 kg)  BMI 27.64 kg/m2 General:   Alert and oriented. Pleasant and cooperative. Well-nourished and well-developed. Oxygen 3 liters.  Head:  Normocephalic and atraumatic. Eyes:  Without icterus, sclera clear and conjunctiva pink.  Ears:  Normal auditory acuity. Nose:  No deformity, discharge,  or lesions. Mouth:  No deformity or lesions, oral mucosa pink.  Lungs:  Clear to auscultation bilaterally. No wheezes, rales, or rhonchi. No distress.  Heart:  S1, S2 present with 2/6 systolic murmur.  Abdomen:  +BS, soft, rounded, query non-tense ascites Rectal:  Deferred  Msk:  Symmetrical without gross deformities. Normal posture. Extremities:  1+ lower extremity edema Neurologic:  Alert and  oriented x4;  grossly normal neurologically. Psych:  Alert and cooperative. Normal mood and affect.  Lab Results  Component Value Date   ALT 39 05/07/2014   AST 32 05/07/2014   ALKPHOS 133* 05/07/2014   BILITOT 0.8 05/07/2014   Lab Results  Component Value Date   WBC 10.4 05/07/2014   HGB 12.6* 05/07/2014   HCT 38.3* 05/07/2014   MCV 90.1 05/07/2014   PLT 211 05/07/2014

## 2015-04-16 ENCOUNTER — Ambulatory Visit: Payer: Medicare Other | Admitting: Gastroenterology

## 2015-04-19 ENCOUNTER — Ambulatory Visit (HOSPITAL_COMMUNITY)
Admission: RE | Admit: 2015-04-19 | Discharge: 2015-04-19 | Disposition: A | Discharge status: Home / Self Care | Payer: Medicare Other | Source: Ambulatory Visit | Attending: Gastroenterology | Admitting: Gastroenterology

## 2015-04-19 DIAGNOSIS — R1900 Intra-abdominal and pelvic swelling, mass and lump, unspecified site: Secondary | ICD-10-CM

## 2015-04-19 DIAGNOSIS — K8020 Calculus of gallbladder without cholecystitis without obstruction: Secondary | ICD-10-CM | POA: Insufficient documentation

## 2015-04-19 DIAGNOSIS — R188 Other ascites: Secondary | ICD-10-CM | POA: Insufficient documentation

## 2015-04-19 LAB — BODY FLUID CELL COUNT WITH DIFFERENTIAL
EOS FL: 0 %
Lymphs, Fluid: 3 %
Monocyte-Macrophage-Serous Fluid: 94 % — ABNORMAL HIGH (ref 50–90)
NEUTROPHIL FLUID: 3 % (ref 0–25)
WBC FLUID: 238 uL (ref 0–1000)

## 2015-04-19 NOTE — Progress Notes (Signed)
Paracentesis complete no signs of distress. 4000 ml amber colored ascites removed.  

## 2015-04-21 ENCOUNTER — Encounter: Payer: Self-pay | Admitting: Cardiology

## 2015-04-21 ENCOUNTER — Ambulatory Visit (INDEPENDENT_AMBULATORY_CARE_PROVIDER_SITE_OTHER): Payer: Medicare Other | Admitting: Cardiology

## 2015-04-21 ENCOUNTER — Encounter: Payer: Self-pay | Admitting: Gastroenterology

## 2015-04-21 VITALS — BP 97/58 | HR 58 | Ht 69.0 in | Wt 180.1 lb

## 2015-04-21 DIAGNOSIS — I509 Heart failure, unspecified: Secondary | ICD-10-CM | POA: Diagnosis not present

## 2015-04-21 DIAGNOSIS — I6523 Occlusion and stenosis of bilateral carotid arteries: Secondary | ICD-10-CM

## 2015-04-21 MED ORDER — METOPROLOL TARTRATE 25 MG PO TABS: 25.0000 mg | ORAL_TABLET | Freq: Two times a day (BID) | ORAL | Status: DC

## 2015-04-21 NOTE — Assessment & Plan Note (Signed)
81 year old male presenting with concern for ascites, no known prior liver disease. Mild to moderate lower extremity edema. History notable for chronic diastolic heart failure. Will proceed with ultrasound abdomen, +/- paracentesis. Fluid analysis to be completed if fluid removal done. If chronic liver disease noted, query secondary to chronic heart failure. Would also need to rule out other causes as well. Proceed with US/para first, with further recommendations to follow.

## 2015-04-21 NOTE — Patient Instructions (Signed)
Your physician recommends that you schedule a follow-up appointment in: 4 WEEKS WITH DR. BRANCH  Your physician has recommended you make the following change in your medication:   DECREASE LOPRESSOR 25 MG TWICE DAILY  Your physician recommends that you return for lab work BMP/MG  Thank you for choosing [facility]!!

## 2015-04-21 NOTE — Progress Notes (Signed)
Patient ID: Brendan Arroyo, male   DOB: [birth year], 81 y.o.   MRN: [id number]     Clinical Summary Brendan Arroyo is a 81 y.o.male seen today for follow up of the following medical problems. This is a focused visit on his history of right sided heart failure/ LV diastolic heart failure. For more detailed medical history refer to previous clinic notes.   1. Chronic diastolic heart failure/Chronic right ventricular systolic dysfunction  - echo 04/2014 LVEF 60-65%, grade I diastolic dysfunction, severe RV dysfunction, moderate TR with PASP 101 - diuretics previously have been limited due to syncope thought related to orthostatic hypotension July 2015.    - since last visit seen by GI, had paracentesis done with 4L of fluid removal. Liver US with findings concerning for possible cirrhosis.  - breathing much better after paracentesis.   Past Medical History  Diagnosis Date  . Dyslipidemia   . HTN (hypertension)   . DM (diabetes mellitus)   . Carotid artery disease     a. Duplex 04/2014: suggest upper range 1-39% BICA.  Marland Kitchen CAD (coronary artery disease)     a. s/p very remote angioplasty, CABG 1993. b. H/o DES to dRCA at anastamotic site of VG 2005, DES to OM 2008. c. Low risk nuc 01/2014.  . H/O: gout   . Diverticulitis   . Colon polyps   . Blood transfusion     '93  . GERD (gastroesophageal reflux disease)   . Hyperlipidemia   . Renal insufficiency     a. OP Cr 04/10/14: 1.32.  Marland Kitchen Stricture and stenosis of esophagus 11/06/2011    egd with esoph stricture dilation 11/2011.  [name]  . Esophageal reflux 11/06/2011  . COPD (chronic obstructive pulmonary disease)     a. Oxygen use 2l/m nasally usually nightly and during day as needed.     No Known Allergies   Current Outpatient Prescriptions  Medication Sig Dispense Refill  . atorvastatin (LIPITOR) 40 MG tablet Take 20 mg by mouth every morning.    . budesonide-formoterol (SYMBICORT) 160-4.5 MCG/ACT inhaler Inhale 2 puffs into the  lungs 2 (two) times daily.    . clopidogrel (PLAVIX) 75 MG tablet Take 75 mg by mouth daily.    . Cyanocobalamin (VITAMIN B-12 IJ) Inject 1,000 mcg as directed every 30 (thirty) days.    Marland Kitchen esomeprazole (NEXIUM) 40 MG capsule Take 1 capsule (40 mg total) by mouth daily at 12 noon. (Patient taking differently: Take 40 mg by mouth daily as needed. ) 30 capsule 12  . gabapentin (NEURONTIN) 300 MG capsule Take 150 mg by mouth at bedtime.     Marland Kitchen glipiZIDE (GLUCOTROL XL) 5 MG 24 hr tablet Take 2.5 mg by mouth daily with breakfast.     . guaifenesin (HUMIBID E) 400 MG TABS tablet Take 400 mg by mouth 2 (two) times daily.    Marland Kitchen HYDROcodone-acetaminophen (NORCO/VICODIN) 5-325 MG per tablet Take 1 tablet by mouth every 6 (six) hours as needed for moderate pain.    . indomethacin (INDOCIN) 50 MG capsule Take 50 mg by mouth as needed.     . metolazone (ZAROXOLYN) 2.5 MG tablet TAKE 1 TIME WEEKLY AS NEEDED FOR SHORTNESS OF BREATH OR SWELLING 30 tablet 3  . metoprolol (LOPRESSOR) 50 MG tablet Take 50 mg by mouth 2 (two) times daily.    . Multiple Vitamins-Minerals ([employer] SPECTRAVITE SENIOR) TABS Take 1 tablet by mouth daily.      . nitroGLYCERIN (NITROSTAT) 0.4 MG SL tablet  Place 0.4 mg under the tongue every 5 (five) minutes as needed for chest pain.    Marland Kitchen Potassium Chloride ER 20 MEQ TBCR Take 20 mEq by mouth 2 (two) times daily.    Marland Kitchen spironolactone (ALDACTONE) 25 MG tablet Take 25 mg by mouth 2 (two) times daily.    Marland Kitchen tiotropium (SPIRIVA) 18 MCG inhalation capsule Place 18 mcg into inhaler and inhale daily.    Marland Kitchen torsemide (DEMADEX) 20 MG tablet Take 40 mg by mouth 2 (two) times daily.      No current facility-administered medications for this visit.     Past Surgical History  Procedure Laterality Date  . Cabg  1993  . Coronary stent placement  July 2005, 2008    taxus stent of the distal right coronary artery   . Elbow surgery      right  . Tonsillectomy    . Back surgery    .  Esophagogastroduodenoscopy  07/31/2012    Procedure: ESOPHAGOGASTRODUODENOSCOPY (EGD);  Surgeon: Meryl Dare, MD,FACG;  Location: [facility] ENDOSCOPY;  Service: Endoscopy;  Laterality: N/A;  pt has food impaction, takes plavix, so no plans to dilate.  . Coronary artery bypass graft    . Esophagogastroduodenoscopy N/A 01/07/2014    Procedure: ESOPHAGOGASTRODUODENOSCOPY (EGD);  Surgeon: Louis Meckel, MD;  Location: [facility] ENDOSCOPY;  Service: Endoscopy;  Laterality: N/A;  . Balloon dilation N/A 01/07/2014    Procedure: BALLOON DILATION;  Surgeon: Louis Meckel, MD;  Location: [facility] ENDOSCOPY;  Service: Endoscopy;  Laterality: N/A;  . Esophagogastroduodenoscopy N/A 03/09/2014    Procedure: ESOPHAGOGASTRODUODENOSCOPY (EGD);  Surgeon: Louis Meckel, MD;  Location: [facility] ENDOSCOPY;  Service: Endoscopy;  Laterality: N/A;  . Balloon dilation N/A 03/09/2014    Procedure: BALLOON DILATION;  Surgeon: Louis Meckel, MD;  Location: [facility] ENDOSCOPY;  Service: Endoscopy;  Laterality: N/A;  . Yag laser application Right 11/30/2014    Procedure: YAG LASER APPLICATION;  Surgeon: Susa Simmonds, MD;  Location: [facility] ORS;  Service: Ophthalmology;  Laterality: Right;     No Known Allergies    Family History  Problem Relation Age of Onset  . Ovarian cancer Mother   . Diabetes Mother   . Diabetes Brother   . Heart disease Maternal Grandfather   . Colon cancer Neg Hx   . Esophageal cancer Neg Hx   . Stomach cancer Neg Hx      Social History Brendan Arroyo reports that he quit smoking about 5 years ago. His smoking use included Cigarettes. He started smoking about 71 years ago. He has a 66 pack-year smoking history. He has never used smokeless tobacco. Brendan Arroyo reports that he drinks about 2.4 oz of alcohol per week.   Review of Systems CONSTITUTIONAL: No weight loss, fever, chills, weakness or fatigue.  HEENT: Eyes: No visual loss, blurred vision, double vision or yellow sclerae.No hearing loss, sneezing, congestion,  runny nose or sore throat.  SKIN: No rash or itching.  CARDIOVASCULAR: per HPI RESPIRATORY: No cough or sputum.  GASTROINTESTINAL: No anorexia, nausea, vomiting or diarrhea. No abdominal pain or blood.  GENITOURINARY: No burning on urination, no polyuria NEUROLOGICAL: No headache, dizziness, syncope, paralysis, ataxia, numbness or tingling in the extremities. No change in bowel or bladder control.  MUSCULOSKELETAL: No muscle, back pain, joint pain or stiffness.  LYMPHATICS: No enlarged nodes. No history of splenectomy.  PSYCHIATRIC: No history of depression or anxiety.  ENDOCRINOLOGIC: No reports of sweating, cold or heat intolerance. No polyuria or polydipsia.  Marland Kitchen  Physical Examination Filed Vitals:   04/21/15 1056  BP: 97/58  Pulse: 58   Filed Vitals:   04/21/15 1056  Height: 5\' 9"  (1.753 m)  Weight: 180 lb 1.9 oz (81.702 kg)    Gen: resting comfortably, no acute distress HEENT: no scleral icterus, pupils equal round and reactive, no palptable cervical adenopathy,  CV: RRR, no m/r/g, no JVD Resp: Clear to auscultation bilaterally GI: abdomen is soft, non-tender, non-distended, normal bowel sounds, no hepatosplenomegaly MSK: extremities are warm, no edema.  Skin: warm, no rash Neuro:  no focal deficits Psych: appropriate affect   Diagnostic Studies  Cath Jan 2010 HEMODYNAMIC DATA:  1. The central aortic pressure is 152/61, mean 93.  2. Left ventricular pressure 164/5.  3. There was no gradient on pullback across aortic valve.  ANGIOGRAPHIC DATA:  1. The left main is free of critical disease.  2. The left anterior descending artery is totally occluded after a  small diagonal.  3. The internal mammary to the diagonal and distal LAD appears to be  intact, with good runoff into both branches. There is disease  between the 2 branches.  4. The native circumflex is calcified. There is a first marginal that  has about 40% ostial narrowing, but is fairly small  in caliber.  The AV circumflex after the takeoff of the second marginal is  previously stented and there is no evidence of any significant  restenosis at this site. The second marginal itself has an  eccentric plaque of about 60% noted in the midportion of that  artery. It has been previously visualized, and compared to  previous study, I do not observe a substantial change. Distally,  the AV circumflex has about 50% eccentric bend leading into the  fairly large third marginal branch.  5. The right coronary artery has a 50-60% mid stenosis. The vessel is  then diffusely diseased distally, and there is competitive filling.  6. The vein graft at its inserts into the distal right covering the PD  and PLA system appears to be widely patent. The stent itself is  patent as well. There is maybe 40% in the continuation branch.  7. The ventriculogram by hand only suggests preserved LV function.  CONCLUSION:  1. Preserved overall left ventricular function.  2. Continued patency of the internal mammary to the diagonal LAD.  3. Continued patency of the saphenous vein graft to the distal right  with continued patency of the stent at its insertion.  4. Continued patency of the stent to the mid circumflex.  5. Moderate lesions of the OM-2 and distal AV circumflex as described  above.  DISPOSITION: I have carefully reviewed the old films and compared them  to the current studies. The circumflex OM lesions do not appear to be  substantially progressed. It potentially could be the source of  ischemia, but does not appear to be unstable as such, given the current  anatomy I am inclined to treat him medically at the present time. We  will see how he does first and I will see him back in followup in the  office soon.   04/2014 Carotid US Summary: Findings suggest upper range 1-39% internal carotid artery stenosis bilaterally. Vertebral arteries are patent with antegrade  flow.  Other specific details can be found in the table(s) above. Prepared and Electronically Authenticated by   01/2014 MPI Impression  Exercise Capacity: Lexiscan with no exercise.  BP Response: Normal blood pressure response.  Clinical Symptoms: No chest pain.  ECG Impression: No change from baseline pattern of widespread T wave inversion.  Comparison with Prior Nuclear Study: No images to compare  Overall Impression: Low risk stress nuclear study. No evidence of ischemia or scar. No new EKG changes with stress. Normal LV systolic function.  LV Ejection Fraction: 70%. LV Wall Motion: Normal Wall Motion  04/2014 Echo Study Conclusions  - Left ventricle: The cavity size was normal. There was mild focal basal hypertrophy of the septum. Systolic function was normal. The estimated ejection fraction was in the range of 60% to 65%. Wall motion was normal; there were no regional wall motion abnormalities. There was an increased relative contribution of atrial contraction to ventricular filling. Doppler parameters are consistent with abnormal left ventricular relaxation (grade 1 diastolic dysfunction). - Mitral valve: Calcified annulus. There was trivial regurgitation. - Left atrium: The atrium was mildly dilated. - Right ventricle: The cavity size was moderately dilated. Wall thickness was normal. Systolic function was severely reduced. - Right atrium: The atrium was severely dilated. - Tricuspid valve: There was moderate regurgitation. - Pulmonic valve: There was trivial regurgitation. - Pulmonary arteries: PA peak pressure: 101 mm Hg (S).  Impressions:  - The right ventricular systolic pressure was increased consistent with severe pulmonary hypertension.        Assessment and Plan  1. Severe pulmonary hypertension with right sided dysfunction/Chronic diastolic heart failure - cor pulmonale due to chronic severe COPD with chronic hypoxia, OSA, left isded dysfunction  may also contribute some to elevated pulm pressures - large volume paracentesis with significant improvement in symptoms. Will hold this weeks metolazone due to large volume loss with paracentesis.     Fu 4 weeks   Antoine Poche, M.D.

## 2015-04-22 NOTE — Progress Notes (Signed)
CC'ED TO PCP 

## 2015-04-24 LAB — CULTURE, BODY FLUID W GRAM STAIN -BOTTLE: Culture: NO GROWTH

## 2015-04-24 LAB — CULTURE, BODY FLUID-BOTTLE

## 2015-05-04 ENCOUNTER — Other Ambulatory Visit: Payer: Self-pay

## 2015-05-04 DIAGNOSIS — K7460 Unspecified cirrhosis of liver: Secondary | ICD-10-CM

## 2015-05-04 NOTE — Progress Notes (Signed)
Quick Note:  US showed cirrhosis. Paracentesis performed. Fluid analysis negative. Cytology negative.  We need blood work for further evaluation.  1. Hep C antibody, Ferritin, Iron, TIBC, CMP, INR, AMA, ANA, ASMA.  2. Please have patient return for routine follow-up with me for further evaluation. ______

## 2015-05-05 ENCOUNTER — Encounter: Payer: Self-pay | Admitting: Internal Medicine

## 2015-05-05 ENCOUNTER — Encounter: Payer: Self-pay | Admitting: *Deleted

## 2015-05-05 NOTE — Progress Notes (Signed)
APPOINTMENT MADE AND LETTER SENT °

## 2015-05-06 ENCOUNTER — Other Ambulatory Visit: Payer: Self-pay | Admitting: Physician Assistant

## 2015-05-10 ENCOUNTER — Telehealth: Payer: Self-pay | Admitting: Internal Medicine

## 2015-05-10 NOTE — Telephone Encounter (Signed)
Patient called today saying he had fluid drawn off on 04/19/2015 and hasn't heard from [place] what the results were. He said that he has called twice and no one has called him back. I told him that I would send the nurse a phone note to check on that for him, but she was at lunch at the moment. He said that she could reach him at [phone]

## 2015-05-10 NOTE — Telephone Encounter (Signed)
i spoke with the pt. Informed him that Candy and I both had spoken with him on 05/04/15. He remembered the conversation but couldn't remember if we had discussed his fluid results. I went over everything with him, answered his questions and have re-mailed his lab orders to him because he said he hasnt received them yet.

## 2015-05-13 ENCOUNTER — Encounter: Payer: Self-pay | Admitting: *Deleted

## 2015-05-25 ENCOUNTER — Ambulatory Visit: Payer: Medicare Other | Admitting: Cardiology

## 2015-05-26 ENCOUNTER — Telehealth: Payer: Self-pay

## 2015-05-26 NOTE — Telephone Encounter (Signed)
Lab received from [facility] and placed on AS desk

## 2015-05-27 ENCOUNTER — Encounter: Payer: Self-pay | Admitting: Cardiology

## 2015-05-27 ENCOUNTER — Ambulatory Visit (INDEPENDENT_AMBULATORY_CARE_PROVIDER_SITE_OTHER): Payer: Medicare Other | Admitting: Cardiology

## 2015-05-27 VITALS — BP 142/98 | HR 60 | Ht 70.0 in | Wt 184.0 lb

## 2015-05-27 DIAGNOSIS — E785 Hyperlipidemia, unspecified: Secondary | ICD-10-CM

## 2015-05-27 DIAGNOSIS — I2781 Cor pulmonale (chronic): Secondary | ICD-10-CM

## 2015-05-27 DIAGNOSIS — I5032 Chronic diastolic (congestive) heart failure: Secondary | ICD-10-CM | POA: Diagnosis not present

## 2015-05-27 DIAGNOSIS — I2510 Atherosclerotic heart disease of native coronary artery without angina pectoris: Secondary | ICD-10-CM

## 2015-05-27 DIAGNOSIS — I272 Pulmonary hypertension, unspecified: Secondary | ICD-10-CM

## 2015-05-27 DIAGNOSIS — I6523 Occlusion and stenosis of bilateral carotid arteries: Secondary | ICD-10-CM

## 2015-05-27 DIAGNOSIS — I270 Primary pulmonary hypertension: Secondary | ICD-10-CM | POA: Diagnosis not present

## 2015-05-27 NOTE — Patient Instructions (Signed)
Your physician recommends that you schedule a follow-up appointment in: 2 Months with Dr. Wyline Mood  Your physician recommends that you continue on your current medications as directed. Please refer to the Current Medication list given to you today.  Thanks for choosing [facility]!!!

## 2015-05-27 NOTE — Progress Notes (Addendum)
Patient ID: Brendan Arroyo, male   DOB: Feb 19, 1929, 81 y.o.   MRN: [id number]     Clinical Summary Brendan Arroyo is a 82 y.o.male seen today for follow up of the following medical problems.   1. Chronic diastolic heart failure/Chronic right ventricular systolic dysfunction  - echo 04/2014 LVEF 60-65%, grade I diastolic dysfunction, severe RV dysfunction, moderate TR with PASP 101 - diuretics previously have been limited due to syncope thought related to orthostatic hypotension July 2015.  - large vol -  had paracentesis done with 4L of fluid removal. Liver US with findings concerning for possible cirrhosis. Followed by GI  - breathing much better after paracentesis. -  since last visit weight flucturates between 174-178.  - he taking torsemide 20mg  tid, metolazone 2.5mg  once weekly.   2. CAD - remote hx of angioplasty over 20 years ago. CABG in 1993. - last cath Jan 2010 LM patent, LAD occluded, LIMA-LAD patent, LCX patent, OM1 40 ostial, patent AV circumflex stent, OM2 60% and stable, RCA 50-60% mid. Patent SVG-RCA with patent stent at its insertion, LVEF normal.  - from notes he has been continued on longterm plavix as opposed to ASA, he is not sure why. Brendan Arroyo 01/2014 low risk, no evidence of ischemia or scar, LVEF 70%  - since our last visit he denies any significant chest pain. Chronic SOB/DOE which is unchanged.    3. Carotid stenosis - mild bilateral disease on last [place] 04/2014 - denies any neurological symptoms  4 . Hyperlipidemia - compliant with lipitor 20mg  daily   5. COPD - on chronic home oxygen - followed by Dr Orson Aloe pulmonary   6. OSA - compliant with CPAP  7. Cirrhosis - followed by GI, ongoing workup for etiology  8. Hernia - being evaluated by surgery  9 CKD - followed by Dr Fausto Skillern. Cr down from 1.8 to 1.5 from last check.    Past Medical History  Diagnosis Date  . Dyslipidemia   . HTN (hypertension)   . DM (diabetes mellitus)   .  Carotid artery disease     a. Duplex 04/2014: suggest upper range 1-39% BICA.  Marland Kitchen CAD (coronary artery disease)     a. s/p very remote angioplasty, CABG 1993. b. H/o DES to dRCA at anastamotic site of VG 2005, DES to OM 2008. c. Low risk nuc 01/2014.  . H/O: gout   . Diverticulitis   . Colon polyps   . Blood transfusion     '93  . GERD (gastroesophageal reflux disease)   . Hyperlipidemia   . Renal insufficiency     a. OP Cr 04/10/14: 1.32.  Marland Kitchen Stricture and stenosis of esophagus 11/06/2011    egd with esoph stricture dilation 11/2011.  Brendan Heaps MD  . Esophageal reflux 11/06/2011  . COPD (chronic obstructive pulmonary disease)     a. Oxygen use 2l/m nasally usually nightly and during day as needed.     No Known Allergies   Current Outpatient Prescriptions  Medication Sig Dispense Refill  . atorvastatin (LIPITOR) 40 MG tablet Take 20 mg by mouth every morning.    . budesonide-formoterol (SYMBICORT) 160-4.5 MCG/ACT inhaler Inhale 2 puffs into the lungs 2 (two) times daily.    . clopidogrel (PLAVIX) 75 MG tablet Take 75 mg by mouth daily.    . Cyanocobalamin (VITAMIN B-12 IJ) Inject 1,000 mcg as directed every 30 (thirty) days.    Marland Kitchen esomeprazole (NEXIUM) 40 MG capsule Take 1 capsule (40 mg total) by mouth  daily at 12 noon. (Patient taking differently: Take 40 mg by mouth daily as needed. ) 30 capsule 12  . gabapentin (NEURONTIN) 300 MG capsule Take 150 mg by mouth at bedtime.     Marland Kitchen glipiZIDE (GLUCOTROL XL) 5 MG 24 hr tablet Take 2.5 mg by mouth daily with breakfast.     . guaifenesin (HUMIBID E) 400 MG TABS tablet Take 400 mg by mouth 2 (two) times daily.    Marland Kitchen HYDROcodone-acetaminophen (NORCO/VICODIN) 5-325 MG per tablet Take 1 tablet by mouth every 6 (six) hours as needed for moderate pain.    . indomethacin (INDOCIN) 50 MG capsule Take 50 mg by mouth as needed.     . metolazone (ZAROXOLYN) 2.5 MG tablet TAKE 1 TIME WEEKLY AS NEEDED FOR SHORTNESS OF BREATH OR SWELLING 30 tablet 3  .  metoprolol tartrate (LOPRESSOR) 25 MG tablet Take 1 tablet (25 mg total) by mouth 2 (two) times daily. 180 tablet 3  . Multiple Vitamins-Minerals ([employer] SPECTRAVITE SENIOR) TABS Take 1 tablet by mouth daily.      . nitroGLYCERIN (NITROSTAT) 0.4 MG SL tablet Place 0.4 mg under the tongue every 5 (five) minutes as needed for chest pain.    Marland Kitchen Potassium Chloride ER 20 MEQ TBCR Take 20 mEq by mouth 2 (two) times daily.    Marland Kitchen spironolactone (ALDACTONE) 25 MG tablet Take 25 mg by mouth 2 (two) times daily.    Marland Kitchen tiotropium (SPIRIVA) 18 MCG inhalation capsule Place 18 mcg into inhaler and inhale daily.    Marland Kitchen torsemide (DEMADEX) 20 MG tablet Take 20 mg by mouth 2 (two) times daily.      No current facility-administered medications for this visit.     Past Surgical History  Procedure Laterality Date  . Cabg  1993  . Coronary stent placement  July 2005, 2008    taxus stent of the distal right coronary artery   . Elbow surgery      right  . Tonsillectomy    . Back surgery    . Esophagogastroduodenoscopy  07/31/2012    Procedure: ESOPHAGOGASTRODUODENOSCOPY (EGD);  Surgeon: Meryl Dare, MD,FACG;  Location: [facility] ENDOSCOPY;  Service: Endoscopy;  Laterality: N/A;  pt has food impaction, takes plavix, so no plans to dilate.  . Coronary artery bypass graft    . Esophagogastroduodenoscopy N/A 01/07/2014    Dr. Arlyce Dice: peptic esophageal stricture s/p dilation  . Balloon dilation N/A 01/07/2014    Procedure: BALLOON DILATION;  Surgeon: Louis Meckel, MD;  Location: [facility] ENDOSCOPY;  Service: Endoscopy;  Laterality: N/A;  . Esophagogastroduodenoscopy N/A 03/09/2014    Dr. Arlyce Dice: esophageal stricture s/p dilation  . Balloon dilation N/A 03/09/2014    Procedure: BALLOON DILATION;  Surgeon: Louis Meckel, MD;  Location: [facility] ENDOSCOPY;  Service: Endoscopy;  Laterality: N/A;  . Yag laser application Right 11/30/2014    Procedure: YAG LASER APPLICATION;  Surgeon: Susa Simmonds, MD;  Location: [facility] ORS;  Service:  Ophthalmology;  Laterality: Right;     No Known Allergies    Family History  Problem Relation Age of Onset  . Ovarian cancer Mother   . Diabetes Mother   . Diabetes Brother   . Heart disease Maternal Grandfather   . Colon cancer Neg Hx   . Esophageal cancer Neg Hx   . Stomach cancer Neg Hx      Social History Brendan Arroyo reports that he quit smoking about 5 years ago. His smoking use included Cigarettes. He started smoking about 93  years ago. He has a 66 pack-year smoking history. He has never used smokeless tobacco. Brendan Arroyo reports that he drinks about 2.4 oz of alcohol per week.   Review of Systems CONSTITUTIONAL: No weight loss, fever, chills, weakness or fatigue.  HEENT: Eyes: No visual loss, blurred vision, double vision or yellow sclerae.No hearing loss, sneezing, congestion, runny nose or sore throat.  SKIN: No rash or itching.  CARDIOVASCULAR: per HPI RESPIRATORY: No cough or sputum.  GASTROINTESTINAL: No anorexia, nausea, vomiting or diarrhea. No abdominal pain or blood.  GENITOURINARY: No burning on urination, no polyuria NEUROLOGICAL: No headache, dizziness, syncope, paralysis, ataxia, numbness or tingling in the extremities. No change in bowel or bladder control.  MUSCULOSKELETAL: No muscle, back pain, joint pain or stiffness.  LYMPHATICS: No enlarged nodes. No history of splenectomy.  PSYCHIATRIC: No history of depression or anxiety.  ENDOCRINOLOGIC: No reports of sweating, cold or heat intolerance. No polyuria or polydipsia.  Marland Kitchen   Physical Examination Filed Vitals:   05/27/15 0836  BP: 142/98  Pulse: 60   Filed Vitals:   05/27/15 0836  Height: 5\' 10"  (1.778 m)  Weight: 184 lb (83.462 kg)    Gen: resting comfortably, no acute distress HEENT: no scleral icterus, pupils equal round and reactive, no palptable cervical adenopathy,  CV: RRR, 2/6 systolic murmur LLSB, no jvd Resp: Clear to auscultation bilaterally GI: abdomen is soft, non-tender,  non-distended, normal bowel sounds, no hepatosplenomegaly MSK: extremities are warm, no edema.  Skin: warm, no rash Neuro:  no focal deficits Psych: appropriate affect   Diagnostic Studies Cath Jan 2010 HEMODYNAMIC DATA:  1. The central aortic pressure is 152/61, mean 93.  2. Left ventricular pressure 164/5.  3. There was no gradient on pullback across aortic valve.  ANGIOGRAPHIC DATA:  1. The left main is free of critical disease.  2. The left anterior descending artery is totally occluded after a  small diagonal.  3. The internal mammary to the diagonal and distal LAD appears to be  intact, with good runoff into both branches. There is disease  between the 2 branches.  4. The native circumflex is calcified. There is a first marginal that  has about 40% ostial narrowing, but is fairly small in caliber.  The AV circumflex after the takeoff of the second marginal is  previously stented and there is no evidence of any significant  restenosis at this site. The second marginal itself has an  eccentric plaque of about 60% noted in the midportion of that  artery. It has been previously visualized, and compared to  previous study, I do not observe a substantial change. Distally,  the AV circumflex has about 50% eccentric bend leading into the  fairly large third marginal branch.  5. The right coronary artery has a 50-60% mid stenosis. The vessel is  then diffusely diseased distally, and there is competitive filling.  6. The vein graft at its inserts into the distal right covering the PD  and PLA system appears to be widely patent. The stent itself is  patent as well. There is maybe 40% in the continuation branch.  7. The ventriculogram by hand only suggests preserved LV function.  CONCLUSION:  1. Preserved overall left ventricular function.  2. Continued patency of the internal mammary to the diagonal LAD.  3. Continued patency of the saphenous vein graft  to the distal right  with continued patency of the stent at its insertion.  4. Continued patency of the stent to the mid  circumflex.  5. Moderate lesions of the OM-2 and distal AV circumflex as described  above.  DISPOSITION: I have carefully reviewed the old films and compared them  to the current studies. The circumflex OM lesions do not appear to be  substantially progressed. It potentially could be the source of  ischemia, but does not appear to be unstable as such, given the current  anatomy I am inclined to treat him medically at the present time. We  will see how he does first and I will see him back in followup in the  office soon.   04/2014 Carotid US Summary: Findings suggest upper range 1-39% internal carotid artery stenosis bilaterally. Vertebral arteries are patent with antegrade flow.  Other specific details can be found in the table(s) above. Prepared and Electronically Authenticated by   01/2014 MPI Impression  Exercise Capacity: Lexiscan with no exercise.  BP Response: Normal blood pressure response.  Clinical Symptoms: No chest pain.  ECG Impression: No change from baseline pattern of widespread T wave inversion.  Comparison with Prior Nuclear Study: No images to compare  Overall Impression: Low risk stress nuclear study. No evidence of ischemia or scar. No new EKG changes with stress. Normal LV systolic function.  LV Ejection Fraction: 70%. LV Wall Motion: Normal Wall Motion  04/2014 Echo Study Conclusions  - Left ventricle: The cavity size was normal. There was mild focal basal hypertrophy of the septum. Systolic function was normal. The estimated ejection fraction was in the range of 60% to 65%. Wall motion was normal; there were no regional wall motion abnormalities. There was an increased relative contribution of atrial contraction to ventricular filling. Doppler parameters are consistent with abnormal left ventricular relaxation (grade  1 diastolic dysfunction). - Mitral valve: Calcified annulus. There was trivial regurgitation. - Left atrium: The atrium was mildly dilated. - Right ventricle: The cavity size was moderately dilated. Wall thickness was normal. Systolic function was severely reduced. - Right atrium: The atrium was severely dilated. - Tricuspid valve: There was moderate regurgitation. - Pulmonic valve: There was trivial regurgitation. - Pulmonary arteries: PA peak pressure: 101 mm Hg (S).  Impressions:  - The right ventricular systolic pressure was increased consistent with severe pulmonary hypertension.     Assessment and Plan  1. Severe pulmonary hypertension with right sided dysfunction/Chronic diastolic heart failure - cor pulmonale due to chronic severe COPD with chronic hypoxia, OSA, left isded dysfunction may also contribute some to elevated pulm pressures - large volume paracentesis with significant improvement in symptoms.   - continue current diuretics.   2. CAD - Lexiscan MPI 01/2014 negative for ischemia - denies any recet chest pain, continue risk factor modification and secondary prevention  3. Carotid stenosis - mild bilateral disease by recent US, no symptoms - continue to follow clinically  4. Hyperlipidemia - continue current statin  5. COPD - management per pulmonary  6. OSA - continue CPAP   F/u 2 months Brendan Arroyo, M.D.   06/17/15 Addendum Below is a copy of the correspondence sent to Dr Michaell Cowing regarding preoperative evaluation for possible hernia repair.  Dr Michaell Cowing,  Received your message on our mutual patient. From a cardiac standpoint he has multiple medical issues with the main one being his severe right ventricular failure with severe pulmonary HTN mainly due to his O2 dependent COPD (cor pulmonale). I think he would be very high risk for any form of surgery, and that his right ventricle likely would not be able to handle the  hemodynamic changes of  surgery with fluid shifts, stress, and being on a ventilator. With his severe COPD I think he likely would be difficult to get off the vent, however Dr Orson Aloe his pulmonologist would be better to evaluate that. I would recommend avoiding surgery unless its an emergent situation.    Dina Rich MD

## 2015-05-31 ENCOUNTER — Ambulatory Visit (INDEPENDENT_AMBULATORY_CARE_PROVIDER_SITE_OTHER): Payer: Medicare Other | Admitting: Gastroenterology

## 2015-05-31 ENCOUNTER — Encounter: Payer: Self-pay | Admitting: Gastroenterology

## 2015-05-31 VITALS — BP 117/72 | HR 66 | Temp 98.9°F | Ht 70.0 in | Wt 180.8 lb

## 2015-05-31 DIAGNOSIS — K7460 Unspecified cirrhosis of liver: Secondary | ICD-10-CM | POA: Diagnosis not present

## 2015-05-31 DIAGNOSIS — I6523 Occlusion and stenosis of bilateral carotid arteries: Secondary | ICD-10-CM

## 2015-05-31 NOTE — Progress Notes (Addendum)
Referring Provider: Dione Housekeeper, MD Primary Care Physician:  Sherrie Mustache, MD Primary GI: Dr. Gala Romney   Chief Complaint  Patient presents with  . Follow-up    HPI:   Brendan Arroyo is a 81 y.o. male presenting today with a history of ascites, chronic diastolic heart failure, with new findings of cirrhosis on [place] July 2016 and 4 liters removed at time of paracentesis. Fluid analysis negative. Outside labs negative for autoimmune hepatitis, iron mildly low at 48, ferritin normal at 126, INR 1, need Hep C, CBC reports. Likely cirrhosis secondary to heart failure, fatty liver, less likely ETOH. Returns today in close follow-up. EGD last done June 2015. Needs Hep A/B vaccinations.   Has right inguinal hernia, extending into scrotum. Sees surgeon in [place] soon. Feels so much better since getting fluid removed. 192 in July, now 180. Weighing every morning. No GI complaints.   Last colonoscopy in remote past.   Past Medical History  Diagnosis Date  . Dyslipidemia   . HTN (hypertension)   . DM (diabetes mellitus)   . Carotid artery disease     a. Duplex 04/2014: suggest upper range 1-39% BICA.  Marland Kitchen CAD (coronary artery disease)     a. s/p very remote angioplasty, CABG 1993. b. H/o DES to dRCA at anastamotic site of VG 2005, DES to OM 2008. c. Low risk nuc 01/2014.  . H/O: gout   . Diverticulitis   . Colon polyps   . Blood transfusion     '93  . GERD (gastroesophageal reflux disease)   . Hyperlipidemia   . Renal insufficiency     a. OP Cr 04/10/14: 1.32.  Marland Kitchen Stricture and stenosis of esophagus 11/06/2011    egd with esoph stricture dilation 11/2011.  Erskine Emery MD  . Esophageal reflux 11/06/2011  . COPD (chronic obstructive pulmonary disease)     a. Oxygen use 2l/m nasally usually nightly and during day as needed.    Past Surgical History  Procedure Laterality Date  . Cabg  1993  . Coronary stent placement  July 2005, 2008    taxus stent of the distal right  coronary artery   . Elbow surgery      right  . Tonsillectomy    . Back surgery    . Esophagogastroduodenoscopy  07/31/2012    Procedure: ESOPHAGOGASTRODUODENOSCOPY (EGD);  Surgeon: Ladene Artist, MD,FACG;  Location: [facility] ENDOSCOPY;  Service: Endoscopy;  Laterality: N/A;  pt has food impaction, takes plavix, so no plans to dilate.  . Coronary artery bypass graft    . Esophagogastroduodenoscopy N/A 01/07/2014    Dr. Deatra Ina: peptic esophageal stricture s/p dilation  . Balloon dilation N/A 01/07/2014    Procedure: BALLOON DILATION;  Surgeon: Inda Castle, MD;  Location: [facility] ENDOSCOPY;  Service: Endoscopy;  Laterality: N/A;  . Esophagogastroduodenoscopy N/A 03/09/2014    Dr. Deatra Ina: esophageal stricture s/p dilation  . Balloon dilation N/A 03/09/2014    Procedure: BALLOON DILATION;  Surgeon: Inda Castle, MD;  Location: [facility] ENDOSCOPY;  Service: Endoscopy;  Laterality: N/A;  . Yag laser application Right 12/13/9700    Procedure: YAG LASER APPLICATION;  Surgeon: Williams Che, MD;  Location: [facility] ORS;  Service: Ophthalmology;  Laterality: Right;    Current Outpatient Prescriptions  Medication Sig Dispense Refill  . atorvastatin (LIPITOR) 40 MG tablet Take 20 mg by mouth every morning.    . budesonide-formoterol (SYMBICORT) 160-4.5 MCG/ACT inhaler Inhale 2 puffs into the lungs 2 (two) times daily.    Marland Kitchen  cetirizine (ZYRTEC) 10 MG tablet Take 10 mg by mouth daily.    . clopidogrel (PLAVIX) 75 MG tablet Take 75 mg by mouth daily.    . Cyanocobalamin (VITAMIN B-12 IJ) Inject 1,000 mcg as directed every 30 (thirty) days.    Marland Kitchen gabapentin (NEURONTIN) 300 MG capsule Take 300 mg by mouth at bedtime.    Marland Kitchen glipiZIDE (GLUCOTROL XL) 5 MG 24 hr tablet Take 2.5 mg by mouth daily with breakfast.     . guaifenesin (HUMIBID E) 400 MG TABS tablet Take 400 mg by mouth 2 (two) times daily.    Marland Kitchen HYDROcodone-acetaminophen (NORCO/VICODIN) 5-325 MG per tablet Take 1 tablet by mouth every 6 (six) hours as needed for  moderate pain.    . metolazone (ZAROXOLYN) 2.5 MG tablet TAKE 1 TIME WEEKLY AS NEEDED FOR SHORTNESS OF BREATH OR SWELLING 30 tablet 3  . metoprolol tartrate (LOPRESSOR) 25 MG tablet Take 25 mg by mouth 2 (two) times daily.    . Misc. Devices MISC Patient needs portable oxygen canister Dx :CHF    . Multiple Vitamins-Minerals ([employer] SPECTRAVITE SENIOR) TABS Take 1 tablet by mouth daily.      . nitroGLYCERIN (NITROSTAT) 0.4 MG SL tablet Place 0.4 mg under the tongue every 5 (five) minutes as needed for chest pain.    Marland Kitchen Potassium Chloride ER 20 MEQ TBCR Take 20 mEq by mouth 2 (two) times daily.    Marland Kitchen spironolactone (ALDACTONE) 25 MG tablet Take 25 mg by mouth 2 (two) times daily.    Marland Kitchen tiotropium (SPIRIVA) 18 MCG inhalation capsule Place 18 mcg into inhaler and inhale daily.    Marland Kitchen torsemide (DEMADEX) 20 MG tablet Take 20 mg by mouth 3 (three) times daily.    Marland Kitchen allopurinol (ZYLOPRIM) 100 MG tablet Take 200 mg by mouth.     No current facility-administered medications for this visit.    Allergies as of 05/31/2015  . (No Known Allergies)    Family History  Problem Relation Age of Onset  . Ovarian cancer Mother   . Diabetes Mother   . Diabetes Brother   . Heart disease Maternal Grandfather   . Colon cancer Neg Hx   . Esophageal cancer Neg Hx   . Stomach cancer Neg Hx     Social History   Social History  . Marital Status: Widowed    Spouse Name: N/A  . Number of Children: 1  . Years of Education: N/A   Occupational History  . retired    Social History Main Topics  . Smoking status: Former Smoker -- 1.00 packs/day for 66 years    Types: Cigarettes    Start date: 02/25/1944    Quit date: 10/02/2009  . Smokeless tobacco: Never Used     Comment: Quit 2011?. has a 40 pack-year history   . Alcohol Use: 2.4 oz/week    4 Shots of liquor per week     Comment: social -occ.Has had drink of choice as beer in past, some liquor  . Drug Use: No  . Sexual Activity: Not Asked   Other Topics  Concern  . None   Social History Narrative   Lives in [place] and is retired. Tries to eat a heart health diet and exercises regularly.     Review of Systems: As mentioned in HPI.   Physical Exam: BP 117/72 mmHg  Pulse 66  Temp(Src) 98.9 F (37.2 C)  Ht $R'5\' 10"'JU$  (1.778 m)  Wt 180 lb 12.8 oz (82.01 kg)  BMI  25.94 kg/m2 General:   Alert and oriented. No distress noted. Pleasant and cooperative. 2 liters nasal cannula.  Head:  Normocephalic and atraumatic. Eyes:  Conjuctiva clear without scleral icterus. Mouth:  Oral mucosa pink and moist. Good dentition. No lesions. Abdomen:  +BS, soft, non-tender and non-distended. Liver margin palpable just below right costal margin Msk:  Symmetrical without gross deformities. Normal posture. Extremities:  Without edema. Neurologic:  Alert and  oriented x4;  grossly normal neurologically. Psych:  Alert and cooperative. Normal mood and affect.  Outside labs to be scanned. Tbili 1.1, ALT 15, AST 24.8, iron mildly low at 48, Alk phos 108, INR 1, Cr 1.58.

## 2015-05-31 NOTE — Patient Instructions (Signed)
Please have the hepatitis vaccinations completed.  I would like to see you back in 3 months.   Please call if any signs of a tight, distended belly, difficulty getting a deep breath in, or discomfort.

## 2015-06-03 DIAGNOSIS — K7460 Unspecified cirrhosis of liver: Secondary | ICD-10-CM | POA: Insufficient documentation

## 2015-06-03 NOTE — Assessment & Plan Note (Addendum)
81 year old male with new findings of cirrhosis, likely multifactorial in setting of chronic heart failure, likely fatty liver, +/- ETOH. Doing well after paracentesis, with negative fluid analysis. EGD up-to-date as of June 2015. Consider repeat for variceal screening in 2017 or so. Needs Hep A and B vaccinations now. Close follow-up recommended. Return in 3 months. Obtain Hep C antibody and recent CBC.

## 2015-06-04 NOTE — Progress Notes (Signed)
cc'ed to pcp °

## 2015-06-08 ENCOUNTER — Encounter: Payer: Self-pay | Admitting: Surgery

## 2015-06-08 ENCOUNTER — Other Ambulatory Visit: Payer: Self-pay | Admitting: Surgery

## 2015-06-08 NOTE — Progress Notes (Addendum)
Multiple labs from outside facility to be scanned into epic. Some have been scanned, but this is difficult to follow. See documented labs thus far in this office note.   MELD 11.   Mortality rate following surgery, completed by inputting data into the mortality risk calculator for cirrhotics postoperatively (with ASA score of 4), notes the following:  7 days: 4.8% 30 days: 18% 90 days: 28% 1 year: 52%   Patient is currently undergoing evaluation by Dr. Michaell Cowing for hernia repair.

## 2015-06-08 NOTE — Progress Notes (Signed)
Patient ID: Brendan Arroyo, male   DOB: [birth year], 81 y.o.   MRN: [id number]  Brendan Arroyo 06/08/2015 1:49 PM Location: [facility] Patient #: [id number] DOB: October 22, 1928 Widowed / Language: [place] / Race: White Male  History of Present Illness Ardeth Sportsman MD; 06/08/2015 2:37 PM) Patient words: hernia.  The patient is a 81 year old male who presents with an inguinal hernia. Patient sent for consultation by his primary care physician, Dr. Lysbeth Galas. Concern of inguinal hernia. Pleasant gentleman. Oxygen requiring COPD. Coronary disease with heart failure. More right-sided. Was admitted a few months ago with ascites and required tapping. Left-sided heart failure concern for early cirrhosis. Improved after paracentesis. Now chronic diuretics. Also history of coronary disease. Had bypass graft over 20 years ago. He's had some stenting done. The last done in 2008. On chronic Plavix. Diabetic. Some renal insufficiency due to his chronic diuretics. Patient's noticed swelling in his bellybutton. He also noticed increasing swelling in his RIGHT groin. It is gone rapidly larger. Down to his scrotum. It is bothersome to him. He presented to his primary care physician. Because of the rapid increase in size and discomfort, surgical consultation requested. He comes today with his significant other. She can walk about 10-15 minutes on the oxygen. Usually has a bowel movement every day. Never had any abdominal surgeries that he can recall. The hernia is really starting to bother him and he wishes to have it repaired. He notices the bellybutton to start to bother him as well.   Problem List/Past Medical Ardeth Sportsman, MD; 06/08/2015 2:27 PM) CIRRHOSIS, CARDIAC (K76.1) CAD, MULTIPLE VESSEL (I25.10)  Other Problems Ardeth Sportsman, MD; 06/08/2015 2:27 PM) Chronic Obstructive Lung Disease Diabetes Mellitus Gastroesophageal Reflux Disease High blood pressure Home  Oxygen Use Hypercholesterolemia Sleep Apnea  Past Surgical History Gilmer Mor, CMA; 06/08/2015 1:49 PM) Coronary Artery Bypass Graft Tonsillectomy  Diagnostic Studies History Gilmer Mor, CMA; 06/08/2015 1:49 PM) Colonoscopy >10 years ago  Allergies Lamar Laundry Bynum, CMA; 06/08/2015 1:50 PM) No Known Drug Allergies09/03/2015  Medication History (Sonya Bynum, CMA; 06/08/2015 1:55 PM) Atorvastatin Calcium (  Tablet, Oral) Active. Symbicort (160-4.5MCG/ACT Aerosol, Inhalation) Active. ZyrTEC Allergy (  Capsule, Oral) Active. Plavix (  Tablet, Oral) Active. Vitamin B Complex (Oral) Active. Gabapentin (  Capsule, Oral) Active. GlipiZIDE XL (  Tablet ER 24HR, Oral) Active. Metolazone (2.5MG  Tablet, Oral) Active. Metoprolol Tartrate (  Tablet, Oral) Active. Nitrostat (0.4MG  Tab Sublingual, Sublingual) Active. Potassium Bicarbonate ( Tablet Effer, Oral) Active. Spironolactone (  Tablet, Oral) Active. Spiriva HandiHaler ( Capsule, Inhalation) Active. Torsemide (  Tablet, Oral) Active. Allopurinol (  Tablet, Oral) Active. Medications Reconciled  Social History Gilmer Mor, CMA; 06/08/2015 1:49 PM) Alcohol use Occasional alcohol use. Caffeine use Coffee. No drug use Tobacco use Former smoker.  Family History Gilmer Mor, CMA; 06/08/2015 1:49 PM) Alcohol Abuse Father. Diabetes Mellitus Brother, Mother. Kidney Disease Brother.  Review of Systems Lamar Laundry Bynum CMA; 06/08/2015 1:49 PM) General Present- Fatigue. Not Present- Appetite Loss, Chills, Fever, Night Sweats, Weight Gain and Weight Loss. Skin Present- Non-Healing Wounds and Rash. Not Present- Change in Wart/Mole, Dryness, Hives, Jaundice, New Lesions and Ulcer. Respiratory Present- Difficulty Breathing and Snoring. Not Present- Bloody sputum, Chronic Cough and Wheezing. Cardiovascular Present- Swelling of Extremities. Not Present- Chest Pain, Difficulty Breathing Lying  Down, Leg Cramps, Palpitations, Rapid Heart Rate and Shortness of Breath. Gastrointestinal Present- Difficulty Swallowing. Not Present- Abdominal Pain, Bloating, Bloody Stool, Change in Bowel Habits, Chronic diarrhea, Constipation, Excessive gas, Gets full quickly at meals, Hemorrhoids, Indigestion, Nausea, Rectal  Pain and Vomiting. Musculoskeletal Present- Joint Pain and Swelling of Extremities. Not Present- Back Pain, Joint Stiffness, Muscle Pain and Muscle Weakness. Neurological Present- Tingling and Weakness. Not Present- Decreased Memory, Fainting, Headaches, Numbness, Seizures, Tremor and Trouble walking. Hematology Present- Easy Bruising. Not Present- Excessive bleeding, Gland problems, HIV and Persistent Infections.   Vitals (Sonya Bynum CMA; 06/08/2015 1:50 PM) 06/08/2015 1:49 PM Weight: 181 lb Height: 73in Body Surface Area: 2.06 m Body Mass Index: 23.88 kg/m Temp.: 49F(Temporal)  Pulse: 73 (Regular)  BP: 118/70 (Sitting, Left Arm, Standard)    Physical Exam Ardeth Sportsman MD; 06/08/2015 2:37 PM) General Mental Status-Alert. General Appearance-Not in acute distress, Not Sickly. Orientation-Oriented X3. Hydration-Well hydrated. Voice-Normal.  Integumentary Global Assessment Upon inspection and palpation of skin surfaces of the - Axillae: non-tender, no inflammation or ulceration, no drainage. and Distribution of scalp and body hair is normal. General Characteristics Temperature - normal warmth is noted.  Head and Neck Head-normocephalic, atraumatic with no lesions or palpable masses. Face Global Assessment - atraumatic, no absence of expression. Neck Global Assessment - no abnormal movements, no bruit auscultated on the right, no bruit auscultated on the left, no decreased range of motion, non-tender. Trachea-midline. Thyroid Gland Characteristics - non-tender.  Eye Eyeball - Left-Extraocular movements intact, No Nystagmus. Eyeball -  Right-Extraocular movements intact, No Nystagmus. Cornea - Left-No Hazy. Cornea - Right-No Hazy. Sclera/Conjunctiva - Left-No scleral icterus, No Discharge. Sclera/Conjunctiva - Right-No scleral icterus, No Discharge. Pupil - Left-Direct reaction to light normal. Pupil - Right-Direct reaction to light normal.  ENMT Ears Pinna - Left - no drainage observed, no generalized tenderness observed. Right - no drainage observed, no generalized tenderness observed. Nose and Sinuses External Inspection of the Nose - no destructive lesion observed. Inspection of the nares - Left - quiet respiration. Right - quiet respiration. Mouth and Throat Lips - Upper Lip - no fissures observed, no pallor noted. Lower Lip - no fissures observed, no pallor noted. Nasopharynx - no discharge present. Oral Cavity/Oropharynx - Tongue - no dryness observed. Oral Mucosa - no cyanosis observed. Hypopharynx - no evidence of airway distress observed.  Chest and Lung Exam Inspection Movements - Normal and Symmetrical. Accessory muscles - No use of accessory muscles in breathing. Palpation Palpation of the chest reveals - Non-tender. Auscultation Breath sounds - Normal and Clear. Note: Distant heart sounds. Wears oxygen.   Cardiovascular Auscultation Rhythm - Regular. Murmurs & Other Heart Sounds - Auscultation of the heart reveals - No Murmurs and No Systolic Clicks.  Abdomen Inspection Inspection of the abdomen reveals - No Visible peristalsis and No Abnormal pulsations. Umbilicus - No Bleeding, No Urine drainage. Palpation/Percussion Palpation and Percussion of the abdomen reveal - Soft, Non Tender, No Rebound tenderness, No Rigidity (guarding) and No Cutaneous hyperesthesia. Note: Overweight but soft. No fluid wave. Supraumbilical hernia reducible. 1.5 centimeter defect. No Caput [place]. No telangiectasias.   Male Genitourinary Sexual Maturity Tanner 5 - Adult hair pattern and Adult penile  size and shape. Note: Circumcised male. Large inguinal hernia goes down to scrotum. Easily reducible. Most likely direct space defect. Testes are normal. Subtle impulse on LEFT side but no major hernia on that side.   Peripheral Vascular Upper Extremity Inspection - Left - No Cyanotic nailbeds, Not Ischemic. Right - No Cyanotic nailbeds, Not Ischemic.  Neurologic Neurologic evaluation reveals -normal attention span and ability to concentrate, able to name objects and repeat phrases. Appropriate fund of knowledge , normal sensation and normal coordination. Mental Status Affect - not  angry, not paranoid. Cranial Nerves-Normal Bilaterally. Gait-Normal.  Neuropsychiatric Mental status exam performed with findings of-able to articulate well with normal speech/language, rate, volume and coherence, thought content normal with ability to perform basic computations and apply abstract reasoning and no evidence of hallucinations, delusions, obsessions or homicidal/suicidal ideation.  Musculoskeletal Global Assessment Spine, Ribs and Pelvis - no instability, subluxation or laxity. Right Upper Extremity - no instability, subluxation or laxity.  Lymphatic Head & Neck  General Head & Neck Lymphatics: Bilateral - Description - No Localized lymphadenopathy. Axillary  General Axillary Region: Bilateral - Description - No Localized lymphadenopathy. Femoral & Inguinal  Generalized Femoral & Inguinal Lymphatics: Left - Description - No Localized lymphadenopathy. Right - Description - No Localized lymphadenopathy.    Assessment & Plan Ardeth Sportsman MD; 06/08/2015 2:36 PM) RIGHT INGUINAL HERNIA (K40.90) Impression: Large RIGHT inguinal hernia down the scrotum. Possible small LEFT inguinal hernias well.  Standard of care would be laparoscopic bilateral hernia repairs with mesh. I usually like to place bilateral mesh is given the larger hernias. However, given his COPD and oxygen  requirements, may not be able to tolerate GETA anesthesia and may need to do under local with sedation. I'm concerned he won't tolerate that well but options are limited. Overnight stay.  Would like to get some sense of pulmonary clearance to minimize primary care physician and pulmonologist to make sure they feel it is safe for him to undergo anesthesia.  I also would like cardiac clearance to make sure it safe to come off his anticoagulation and make sure he's been optimized first. Does not look like he has poor hepatic function at this time. I would be concerned with that as well. He also has chronic renal insufficiency most likely from his diuretic dependence. However that is not markedly worsened.  If this hernia became incarcerated or strangulated, his operative risk would be much higher. He really wishes to be aggressive and have this fixed since he is actually quite active and independent with an active social & sexual life. He doesn't want this interfering. He understands his risks are markedly increased but wishes to be aggressive and excessive as soon as possible. I again stressed to him the importance of trying to optimize him medically for proceeding with surgery. Current Plans  Pt Education - Patient education: Inguinal and femoral (groin) hernias (The Basics): discussed with patient and provided information.   The anatomy & physiology of the abdominal wall and pelvic floor was discussed. The pathophysiology of hernias in the inguinal and pelvic region was discussed. Natural history risks such as progressive enlargement, pain, incarceration, and strangulation was discussed. Contributors to complications such as smoking, obesity, diabetes, prior surgery, etc were discussed.  I feel the risks of no intervention will lead to serious problems that outweigh the operative risks; therefore, I recommended surgery to reduce and repair the hernia. I explained laparoscopic techniques with possible  need for an open approach. I noted usual use of mesh to patch and/or buttress hernia repair  Risks such as bleeding, infection, abscess, need for further treatment, heart attack, death, and other risks were discussed. I noted a good likelihood this will help address the problem. Goals of post-operative recovery were discussed as well. Possibility that this will not correct all symptoms was explained. I stressed the importance of low-impact activity, aggressive pain control, avoiding constipation, & not pushing through pain to minimize risk of post-operative chronic pain or injury. Possibility of reherniation was discussed. We will work to minimize  complications.  An educational handout further explaining the pathology & treatment options was given as well. Questions were answered. The patient expresses understanding & wishes to proceed with surgery.   I recommended obtaining preoperative cardiac clearance. I am concerned about the health of the patient and the ability to tolerate the operation. Therefore, we will request clearance by cardiology to better assess operative risk & see if a reevaluation, further workup, etc is needed. Also recommendations on how medications such as for anticoagulation and blood pressure should be managed/held/restarted after surgery.   I recommended obtaining preoperative medical clearance. I am concerned about the health of the patient and the ability to tolerate the operation. Therefore, we will request clearance by medicine to better assess operative risk & see if a reevaluation, further workup, etc is needed. Also recommendations on how medications should be managed/held/restarted after surgery.   I recommended obtaining preoperative pulmoanry clearance. I am concerned about the health of the patient and the ability to tolerate the operation. Therefore, we will request clearance by pulmonary better assess operative risk & see if a reevaluation, further workup, etc is  needed. Also recommendations on how medications and therapies should be managed/held/restarted after surgery. Pt Education - [facility] Hernia Post-Op HCI (Qusai Kem): discussed with patient and provided information. Pt Education - [facility] Good Bowel Health (Aletheia Tangredi) Pt Education - [facility] Pain control - tylenol only: discussed with patient and provided information. If you're cleared by your medical and heart doctors, you will be scheduled for surgery - Our schedulers will call you.  You should hear from our office's scheduling department within 5 working days about the location, date, and time of surgery. We try to make accommodations for patient's preferences in scheduling surgery, but sometimes the OR schedule or the surgeon's schedule prevents [place] from making those accommodations.  If you have not heard from our office [phone]) in 5 working days, call the office and ask for your surgeon's nurse.  If you have other questions about your diagnosis, plan, or surgery, call the office and ask for your surgeon's nurse. Written instructions provided Pt Education - Pamphlet Given - Laparoscopic Hernia Repair: discussed with patient and provided information. UMBILICAL HERNIA WITHOUT OBSTRUCTION AND WITHOUT GANGRENE (K42.9) Impression: Not particularly large but noticeable. Most likely trying to primary repair and has only. With his recent ascites and his straining with CBD, hernia risk recurrence is higher. Try to hold off unless cardiopulmonary risks not that bad and would consider lap underlay repair as well.    Signed by Ardeth Sportsman, MD (06/08/2015 2:39 PM)  Ardeth Sportsman, M.D., F.A.C.S. Gastrointestinal and Minimally Invasive Surgery [facility] [address] [place], [place] [address] [phone] Main / Paging

## 2015-06-09 NOTE — Progress Notes (Signed)
Dear Dr. Michaell Cowing:  Brendan Arroyo just received current labs to plug patient's numbers into the Mayo risk stratification model for mortality risk for major surgery in the setting of cirrhosis and other comorbidities.  Please see mortality risk below. Nearly 5% risk of mortality right off the bat with additional risk acquired over the relative short run. This gentleman is likely marginally compensated;  I suspect even a hernia repair could push him over the "edge".  I recommend keeping him out of the OR if at all possible. Would focus on managing his third spaced fluid with salt and fluid restriction, diuretic therapy and paracentesis as appropriate along with utilizing abdominal binders etc. and other  non-operative treatments for his hernias. I hope this information is helpful to you.     Multiple labs from outside facility to be scanned into epic. Some have been scanned, but this is difficult to follow. See documented labs thus far in this office note.   MELD 11.   Mortality rate following surgery, completed by inputting data into the mortality risk calculator for cirrhotics postoperatively (with ASA score of 4), notes the following:  7 days: 4.8% 30 days: 18% 90 days: 28% 1 year: 52%   Patient is currently undergoing evaluation by Dr. Michaell Cowing for hernia repair.

## 2015-06-11 NOTE — Progress Notes (Signed)
Good luck. Let me know if I can help

## 2015-06-11 NOTE — Progress Notes (Signed)
Suspected as such.  Appreciate the feedback.  I will wait to hear from the rest of the specialists.  Not an easy situation.  Teodoro Kil, M.D., F.A.C.S. Gastrointestinal and Minimally Invasive Surgery [facility] [address] [place], [place] [address] [phone] Main / Paging

## 2015-06-17 ENCOUNTER — Telehealth: Payer: Self-pay | Admitting: Cardiology

## 2015-06-17 ENCOUNTER — Telehealth: Payer: Self-pay

## 2015-06-17 NOTE — Telephone Encounter (Signed)
Pt is wanting to have a PARA done. He is some(not to bad) trouble breathing and shortness of breath. He belly is tight. He would like to have this done next week if possible. His call back number [phone]

## 2015-06-17 NOTE — Telephone Encounter (Signed)
Left message to return call 

## 2015-06-17 NOTE — Telephone Encounter (Signed)
Please advise.  Clearance request is on your desk.  (hernia repair surgery)

## 2015-06-17 NOTE — Telephone Encounter (Signed)
Brendan Arroyo called the office today stating that Dr. Karie Soda is waiting on a clearance from Dr. Wyline Mood to do a surgical Procedure.  Brendan Arroyo states that he is in need of having surgery.

## 2015-06-17 NOTE — Telephone Encounter (Signed)
Patient notified.  Will forward Dr. Verna Czech note to Dr. Michaell Cowing.

## 2015-06-17 NOTE — Telephone Encounter (Signed)
Please let patient know that I have messaged Dr Michaell Cowing my evaluation. I think he is very high risk for surgery and would not recommend pursuing unless an emergency due to his severe right sided heart failure and pulmonary hypertension.    Dominga Ferry MD

## 2015-06-18 ENCOUNTER — Other Ambulatory Visit: Payer: Self-pay

## 2015-06-18 DIAGNOSIS — R188 Other ascites: Secondary | ICD-10-CM

## 2015-06-18 NOTE — Telephone Encounter (Signed)
Yes, may do [place] with para. Albumin 25 g IV X 1 if 4 liters or more removed.

## 2015-06-18 NOTE — Telephone Encounter (Signed)
He is set up for 06/28/15 @ 1:00 and he is aware

## 2015-06-28 ENCOUNTER — Ambulatory Visit (HOSPITAL_COMMUNITY)
Admission: RE | Admit: 2015-06-28 | Discharge: 2015-06-28 | Disposition: A | Discharge status: Home / Self Care | Payer: Medicare Other | Source: Ambulatory Visit | Attending: Gastroenterology | Admitting: Gastroenterology

## 2015-06-28 DIAGNOSIS — R188 Other ascites: Secondary | ICD-10-CM | POA: Diagnosis present

## 2015-06-28 NOTE — Procedures (Signed)
PreOperative Dx: ascites Postoperative Dx: ascites Procedure:   US guided paracentesis Radiologist:  Tyron Russell Anesthesia:  10 ml of 1% lidocaine Specimen:  3200 ml of amber colored ascitic fluid EBL:   < 1 ml Complications: None

## 2015-06-28 NOTE — Sedation Documentation (Signed)
Vital signs stable. 

## 2015-06-28 NOTE — Sedation Documentation (Signed)
Patient denies pain and is resting comfortably.  

## 2015-06-28 NOTE — Sedation Documentation (Signed)
MD at bedside. 

## 2015-06-28 NOTE — Sedation Documentation (Signed)
3200cc of amber fluid drained. Pt tolerated without any difficulty.

## 2015-06-28 NOTE — Sedation Documentation (Signed)
Numbing medication instilled by MD 

## 2015-07-28 ENCOUNTER — Ambulatory Visit: Payer: Medicare Other | Admitting: Cardiology

## 2015-07-28 ENCOUNTER — Encounter: Payer: Self-pay | Admitting: Cardiology

## 2015-07-28 NOTE — Progress Notes (Unsigned)
Patient ID: Brendan Arroyo, male   DOB: May 17, 1929, 81 y.o.   MRN: [id number]     Clinical Summary Brendan Arroyo is a 81 y.o.male seen today for follow up of the following medical problems.   1. Chronic diastolic heart failure/Chronic right ventricular systolic dysfunction  - echo 04/2014 LVEF 60-65%, grade I diastolic dysfunction, severe RV dysfunction, moderate TR with PASP 101 - diuretics previously have been limited due to syncope thought related to orthostatic hypotension July 2015.  - large vol - had paracentesis done with 4L of fluid removal. Liver US with findings concerning for possible cirrhosis. Followed by GI  - breathing much better after paracentesis. - since last visit weight flucturates between 174-178.  - he taking torsemide  tid, metolazone 2.5mg  once weekly.   2. CAD - remote hx of angioplasty over 20 years ago. CABG in 1993. - last cath Jan 2010 LM patent, LAD occluded, LIMA-LAD patent, LCX patent, OM1 40 ostial, patent AV circumflex stent, OM2 60% and stable, RCA 50-60% mid. Patent SVG-RCA with patent stent at its insertion, LVEF normal.  - from notes he has been continued on longterm plavix as opposed to ASA, he is not sure why. [name] 01/2014 low risk, no evidence of ischemia or scar, LVEF 70%  - since our last visit he denies any significant chest pain. Chronic SOB/DOE which is unchanged.    3. Carotid stenosis - mild bilateral disease on last [place] 04/2014 - denies any neurological symptoms  4 . Hyperlipidemia - compliant with lipitor  daily   5. COPD - on chronic home oxygen - followed by Dr Orson Aloe pulmonary   6. OSA - compliant with CPAP  7. Cirrhosis - followed by GI, ongoing workup for etiology  8. Hernia - being evaluated by surgery  9 CKD - followed by Dr Fausto Skillern. Cr down from 1.8 to 1.5 from last check. Past Medical History  Diagnosis Date  . Dyslipidemia   . HTN (hypertension)   . DM (diabetes mellitus)   . Carotid  artery disease     a. Duplex 04/2014: suggest upper range 1-39% BICA.  Marland Kitchen CAD (coronary artery disease)     a. s/p very remote angioplasty, CABG 1993. b. H/o DES to dRCA at anastamotic site of VG 2005, DES to OM 2008. c. Low risk nuc 01/2014.  . H/O: gout   . Diverticulitis   . Colon polyps   . Blood transfusion     '93  . GERD (gastroesophageal reflux disease)   . Hyperlipidemia   . Renal insufficiency     a. OP Cr 04/10/14: 1.32.  Marland Kitchen Stricture and stenosis of esophagus 11/06/2011    egd with esoph stricture dilation 11/2011.  [name]  . Esophageal reflux 11/06/2011  . COPD (chronic obstructive pulmonary disease)     a. Oxygen use 2l/m nasally usually nightly and during day as needed.     No Known Allergies   Current Outpatient Prescriptions  Medication Sig Dispense Refill  . allopurinol (ZYLOPRIM) 100 MG tablet Take 200 mg by mouth.    Marland Kitchen atorvastatin (LIPITOR) 40 MG tablet Take 20 mg by mouth every morning.    . budesonide-formoterol (SYMBICORT) 160-4.5 MCG/ACT inhaler Inhale 2 puffs into the lungs 2 (two) times daily.    . cetirizine (ZYRTEC) 10 MG tablet Take 10 mg by mouth daily.    . clopidogrel (PLAVIX) 75 MG tablet Take 75 mg by mouth daily.    . Cyanocobalamin (VITAMIN B-12 IJ) Inject 1,000  mcg as directed every 30 (thirty) days.    Marland Kitchen gabapentin (NEURONTIN) 300 MG capsule Take 300 mg by mouth at bedtime.    Marland Kitchen glipiZIDE (GLUCOTROL XL) 5 MG 24 hr tablet Take 2.5 mg by mouth daily with breakfast.     . guaifenesin (HUMIBID E) 400 MG TABS tablet Take 400 mg by mouth 2 (two) times daily.    Marland Kitchen HYDROcodone-acetaminophen (NORCO/VICODIN) 5-325 MG per tablet Take 1 tablet by mouth every 6 (six) hours as needed for moderate pain.    . metolazone (ZAROXOLYN) 2.5 MG tablet TAKE 1 TIME WEEKLY AS NEEDED FOR SHORTNESS OF BREATH OR SWELLING 30 tablet 3  . metoprolol tartrate (LOPRESSOR) 25 MG tablet Take 25 mg by mouth 2 (two) times daily.    . Misc. Devices MISC Patient needs portable  oxygen canister Dx :CHF    . Multiple Vitamins-Minerals ([employer] SPECTRAVITE SENIOR) TABS Take 1 tablet by mouth daily.      . nitroGLYCERIN (NITROSTAT) 0.4 MG SL tablet Place 0.4 mg under the tongue every 5 (five) minutes as needed for chest pain.    Marland Kitchen Potassium Chloride ER 20 MEQ TBCR Take 20 mEq by mouth 2 (two) times daily.    Marland Kitchen spironolactone (ALDACTONE) 25 MG tablet Take 25 mg by mouth 2 (two) times daily.    Marland Kitchen tiotropium (SPIRIVA) 18 MCG inhalation capsule Place 18 mcg into inhaler and inhale daily.    Marland Kitchen torsemide (DEMADEX) 20 MG tablet Take 20 mg by mouth 3 (three) times daily.     No current facility-administered medications for this visit.     Past Surgical History  Procedure Laterality Date  . Cabg  1993  . Coronary stent placement  July 2005, 2008    taxus stent of the distal right coronary artery   . Elbow surgery      right  . Tonsillectomy    . Back surgery    . Esophagogastroduodenoscopy  07/31/2012    Procedure: ESOPHAGOGASTRODUODENOSCOPY (EGD);  Surgeon: Meryl Dare, MD,FACG;  Location: [facility] END[facility]COPY;  Service: Endoscopy;  Laterality: N/A;  pt has food impaction, takes plavix, so no plans to dilate.  . Coronary artery bypass graft    . Esophagogastroduodenoscopy N/A 01/07/2014    Dr. Arlyce Dice: peptic esophageal stricture s/p dilation  . Balloon dilation N/A 01/07/2014    Procedure: BALLOON DILATION;  Surgeon: Louis Meckel, MD;  Location: [facility] ENDOSCOPY;  Service: Endoscopy;  Laterality: N/A;  . Esophagogastroduodenoscopy N/A 03/09/2014    Dr. Arlyce Dice: esophageal stricture s/p dilation  . Balloon dilation N/A 03/09/2014    Procedure: BALLOON DILATION;  Surgeon: Louis Meckel, MD;  Location: [facility] ENDOSCOPY;  Service: Endoscopy;  Laterality: N/A;  . Yag laser application Right 11/30/2014    Procedure: YAG LASER APPLICATION;  Surgeon: Susa Simmonds, MD;  Location: [facility] ORS;  Service: Ophthalmology;  Laterality: Right;     No Known Allergies    Family History  Problem  Relation Age of Onset  . Ovarian cancer Mother   . Diabetes Mother   . Diabetes Brother   . Heart disease Maternal Grandfather   . Colon cancer Neg Hx   . Esophageal cancer Neg Hx   . Stomach cancer Neg Hx      Social History Brendan Arroyo reports that he quit smoking about 5 years ago. His smoking use included Cigarettes. He started smoking about 71 years ago. He has a 66 pack-year smoking history. He has never used smokeless tobacco. Brendan Arroyo reports that  he drinks about 2.4 oz of alcohol per week.   Review of Systems CONSTITUTIONAL: No weight loss, fever, chills, weakness or fatigue.  HEENT: Eyes: No visual loss, blurred vision, double vision or yellow sclerae.No hearing loss, sneezing, congestion, runny nose or sore throat.  SKIN: No rash or itching.  CARDIOVASCULAR:  RESPIRATORY: No shortness of breath, cough or sputum.  GASTROINTESTINAL: No anorexia, nausea, vomiting or diarrhea. No abdominal pain or blood.  GENITOURINARY: No burning on urination, no polyuria NEUROLOGICAL: No headache, dizziness, syncope, paralysis, ataxia, numbness or tingling in the extremities. No change in bowel or bladder control.  MUSCULOSKELETAL: No muscle, back pain, joint pain or stiffness.  LYMPHATICS: No enlarged nodes. No history of splenectomy.  PSYCHIATRIC: No history of depression or anxiety.  ENDOCRINOLOGIC: No reports of sweating, cold or heat intolerance. No polyuria or polydipsia.  Marland Kitchen   Physical Examination There were no vitals filed for this visit. There were no vitals filed for this visit.  Gen: resting comfortably, no acute distress HEENT: no scleral icterus, pupils equal round and reactive, no palptable cervical adenopathy,  CV Resp: Clear to auscultation bilaterally GI: abdomen is soft, non-tender, non-distended, normal bowel sounds, no hepatosplenomegaly MSK: extremities are warm, no edema.  Skin: warm, no rash Neuro:  no focal deficits Psych: appropriate affect   Diagnostic  Studies Cath Jan 2010 HEMODYNAMIC DATA:  1. The central aortic pressure is 152/61, mean 93.  2. Left ventricular pressure 164/5.  3. There was no gradient on pullback across aortic valve.  ANGIOGRAPHIC DATA:  1. The left main is free of critical disease.  2. The left anterior descending artery is totally occluded after a  small diagonal.  3. The internal mammary to the diagonal and distal LAD appears to be  intact, with good runoff into both branches. There is disease  between the 2 branches.  4. The native circumflex is calcified. There is a first marginal that  has about 40% ostial narrowing, but is fairly small in caliber.  The AV circumflex after the takeoff of the second marginal is  previously stented and there is no evidence of any significant  restenosis at this site. The second marginal itself has an  eccentric plaque of about 60% noted in the midportion of that  artery. It has been previously visualized, and compared to  previous study, I do not observe a substantial change. Distally,  the AV circumflex has about 50% eccentric bend leading into the  fairly large third marginal Brendan Arroyo.  5. The right coronary artery has a 50-60% mid stenosis. The vessel is  then diffusely diseased distally, and there is competitive filling.  6. The vein graft at its inserts into the distal right covering the PD  and PLA system appears to be widely patent. The stent itself is  patent as well. There is maybe 40% in the continuation Sairah Knobloch.  7. The ventriculogram by hand only suggests preserved LV function.  CONCLUSION:  1. Preserved overall left ventricular function.  2. Continued patency of the internal mammary to the diagonal LAD.  3. Continued patency of the saphenous vein graft to the distal right  with continued patency of the stent at its insertion.  4. Continued patency of the stent to the mid circumflex.  5. Moderate lesions of the OM-2 and distal AV  circumflex as described  above.  DISPOSITION: I have carefully reviewed the old films and compared them  to the current studies. The circumflex OM lesions do not appear to be  substantially progressed. It potentially could be the source of  ischemia, but does not appear to be unstable as such, given the current  anatomy I am inclined to treat him medically at the present time. We  will see how he does first and I will see him back in followup in the  office soon.   04/2014 Carotid US Summary: Findings suggest upper range 1-39% internal carotid artery stenosis bilaterally. Vertebral arteries are patent with antegrade flow.  Other specific details can be found in the table(s) above. Prepared and Electronically Authenticated by   01/2014 MPI Impression  Exercise Capacity: Lexiscan with no exercise.  BP Response: Normal blood pressure response.  Clinical Symptoms: No chest pain.  ECG Impression: No change from baseline pattern of widespread T wave inversion.  Comparison with Prior Nuclear Study: No images to compare  Overall Impression: Low risk stress nuclear study. No evidence of ischemia or scar. No new EKG changes with stress. Normal LV systolic function.  LV Ejection Fraction: 70%. LV Wall Motion: Normal Wall Motion  04/2014 Echo Study Conclusions  - Left ventricle: The cavity size was normal. There was mild focal basal hypertrophy of the septum. Systolic function was normal. The estimated ejection fraction was in the range of 60% to 65%. Wall motion was normal; there were no regional wall motion abnormalities. There was an increased relative contribution of atrial contraction to ventricular filling. Doppler parameters are consistent with abnormal left ventricular relaxation (grade 1 diastolic dysfunction). - Mitral valve: Calcified annulus. There was trivial regurgitation. - Left atrium: The atrium was mildly dilated. - Right ventricle: The cavity size was  moderately dilated. Wall thickness was normal. Systolic function was severely reduced. - Right atrium: The atrium was severely dilated. - Tricuspid valve: There was moderate regurgitation. - Pulmonic valve: There was trivial regurgitation. - Pulmonary arteries: PA peak pressure: 101 mm Hg (S).  Impressions:  - The right ventricular systolic pressure was increased consistent with severe pulmonary hypertension.       Assessment and Plan   1. Severe pulmonary hypertension with right sided dysfunction/Chronic diastolic heart failure - cor pulmonale due to chronic severe COPD with chronic hypoxia, OSA, left isded dysfunction may also contribute some to elevated pulm pressures - large volume paracentesis with significant improvement in symptoms.   - continue current diuretics.   2. CAD - Lexiscan MPI 01/2014 negative for ischemia - denies any recet chest pain, continue risk factor modification and secondary prevention  3. Carotid stenosis - mild bilateral disease by recent US, no symptoms - continue to follow clinically  4. Hyperlipidemia - continue current statin  5. COPD - management per pulmonary  6. OSA - continue CPAP     Antoine PocheJonathan F. Adlee Paar, M.D.

## 2015-08-03 ENCOUNTER — Telehealth: Payer: Self-pay | Admitting: Internal Medicine

## 2015-08-03 NOTE — Telephone Encounter (Signed)
[phone]  Patient states that he feels tight in the stomach area and thinks he needs a para.  Does not think he can wait until his fu with anna  to have it drawn    Please advise

## 2015-08-04 ENCOUNTER — Telehealth: Payer: Self-pay | Admitting: Internal Medicine

## 2015-08-04 ENCOUNTER — Other Ambulatory Visit: Payer: Self-pay

## 2015-08-04 DIAGNOSIS — R188 Other ascites: Secondary | ICD-10-CM

## 2015-08-04 NOTE — Telephone Encounter (Signed)
See phone note from 11/2.

## 2015-08-04 NOTE — Telephone Encounter (Signed)
Pt called this morning asking if we had heard anything from provider so he can have fluid drawn off. I told him that a phone note was put in yesterday and we were waiting for recommendations and nurse would be calling him back. Patient agreed.

## 2015-08-04 NOTE — Telephone Encounter (Signed)
Routing to Anna

## 2015-08-04 NOTE — Telephone Encounter (Signed)
Yes, he may have a paracentesis.   Needs 25 g IV albumin X 1 at 4 liters.

## 2015-08-04 NOTE — Telephone Encounter (Signed)
Called pt and LMOM with Para appt 08/09/2015 @ 10:45am

## 2015-08-07 IMAGING — CT CT HEAD W/O CM
2 series · 16 of 30 positions shown, 18 images · non-contrast
Comparison: 10/15/2008 head CT

CLINICAL DATA: 85-year-old male with dizziness, loss of
consciousness, fall and head injury.

EXAM:
CT HEAD WITHOUT CONTRAST
TECHNIQUE: Contiguous axial images were obtained from the base of the skull
through the vertex without intravenous contrast.

[Series 201: head w/o, idose (1) · axial · non-contrast · 0.46mm/px · z∈[+97,+217]mm · 8 of 32 slices shown, 10 images]
[im 4/32  brain]
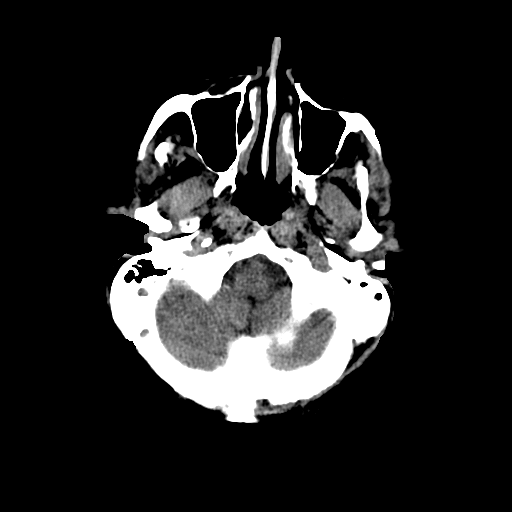
[im 4/32  bone]
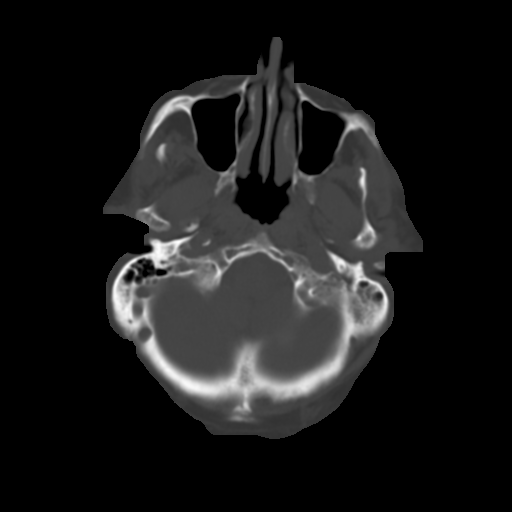
[im 7/32  brain]
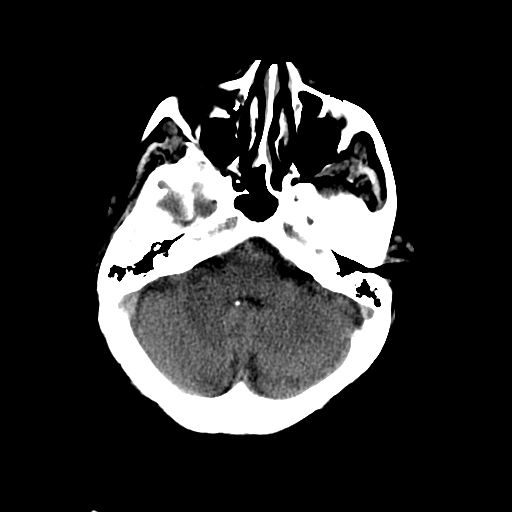
[im 11/32  brain]
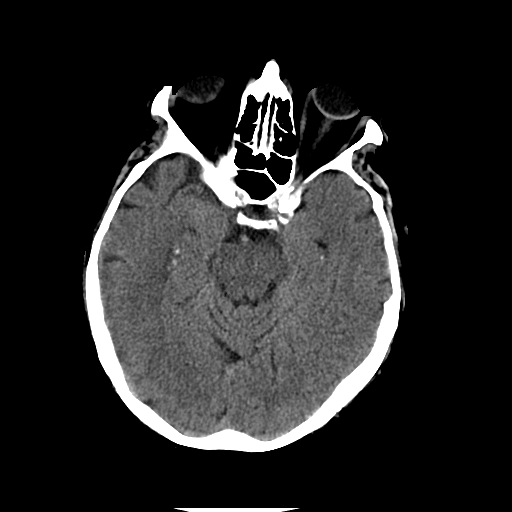
[im 14/32  brain]
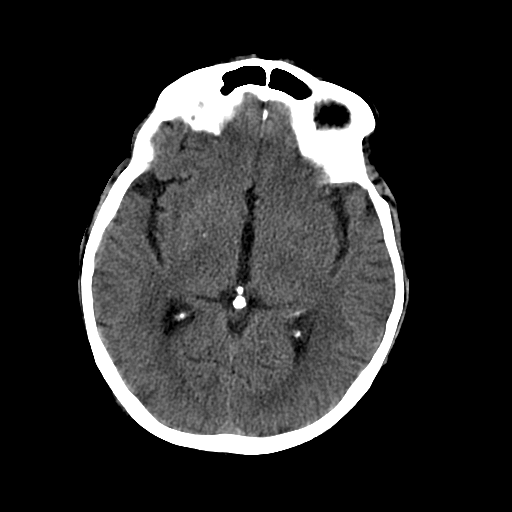
[im 18/32  brain]
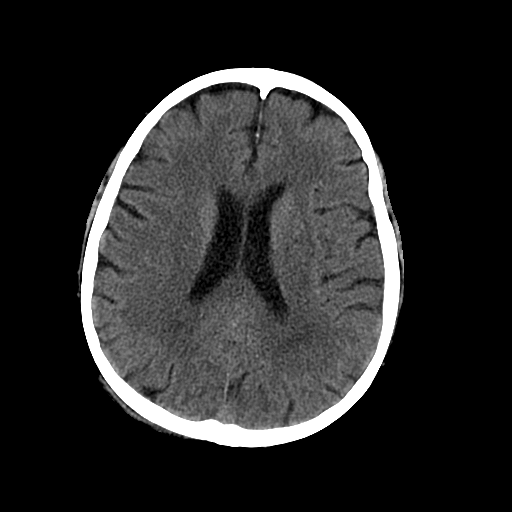
[im 18/32  bone]
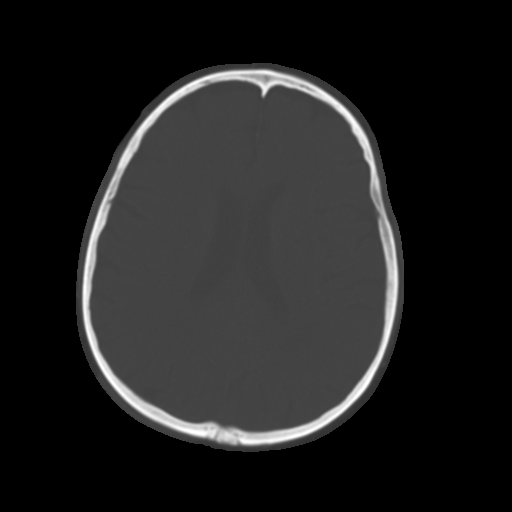
[im 21/32  brain]
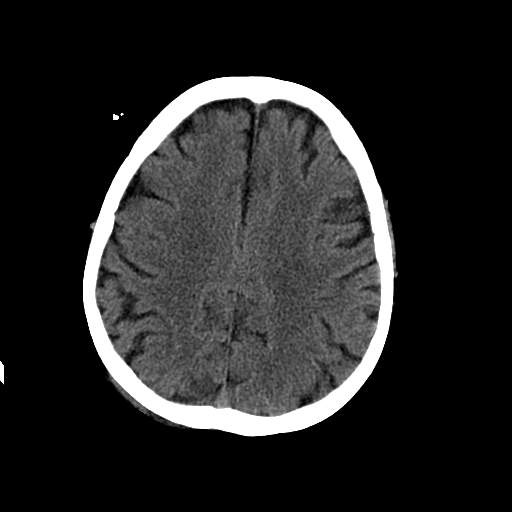
[im 25/32  brain]
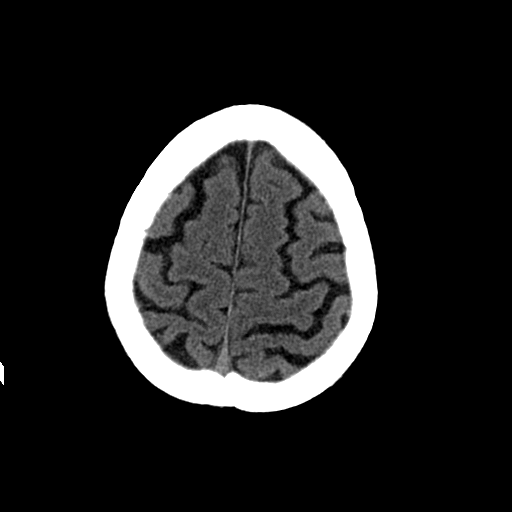
[im 28/32  brain]
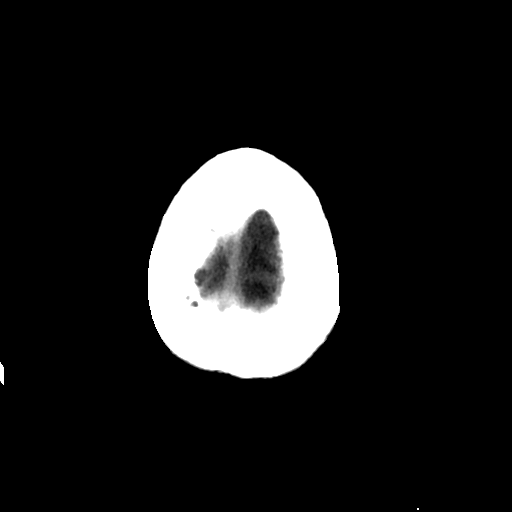

[Series 202: head w/o bone, idose (1) · axial · non-contrast · 0.46mm/px · z∈[+96,+221]mm · 8 of 64 slices shown]
[im 7/64  bone]
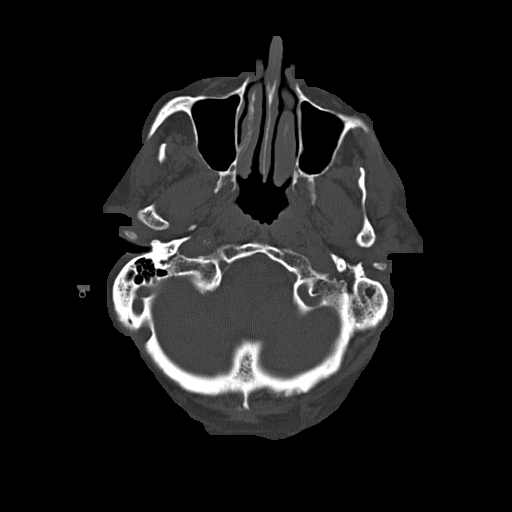
[im 14/64  bone]
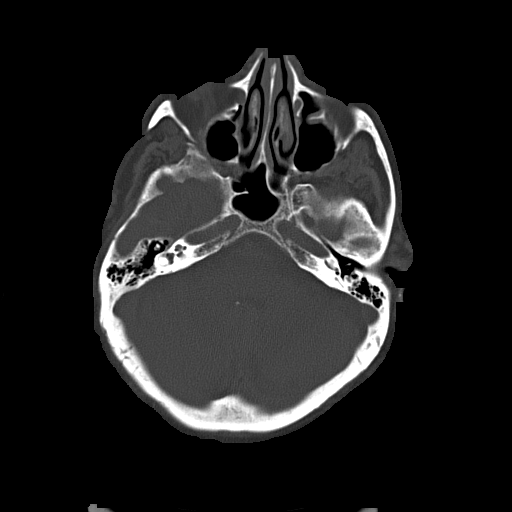
[im 20/64  bone]
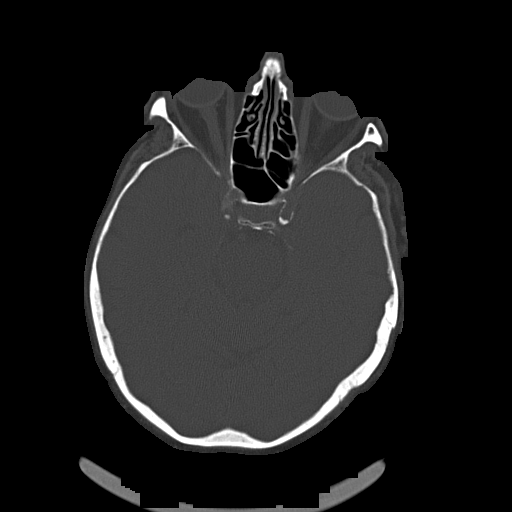
[im 27/64  bone]
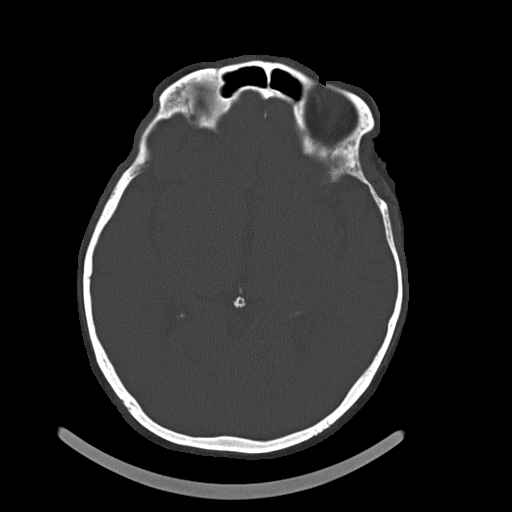
[im 37/64  bone]
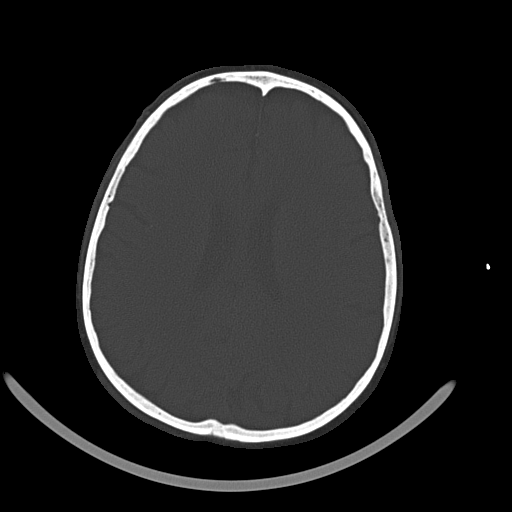
[im 44/64  bone]
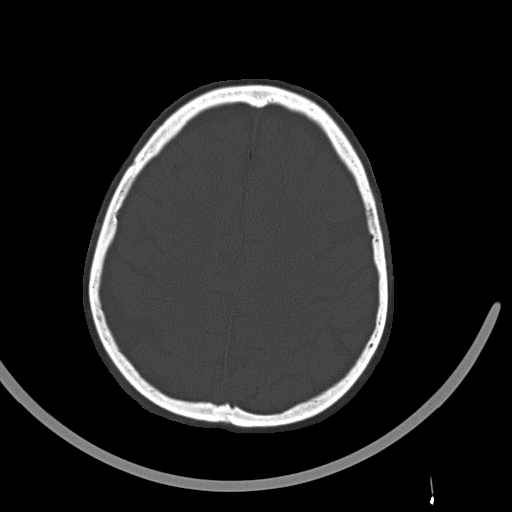
[im 50/64  bone]
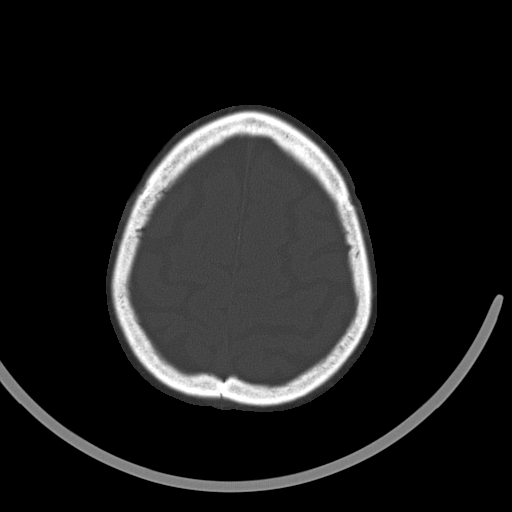
[im 57/64  bone]
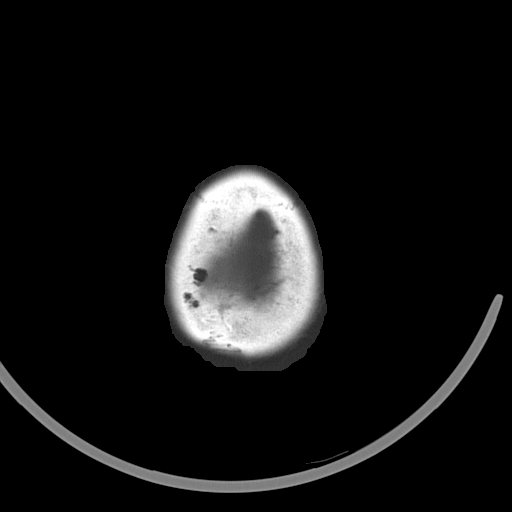

[16 of 30 positions shown; findings below may reference images not displayed]

FINDINGS: Mild generalized cerebral volume loss is noted.

No acute intracranial abnormalities are identified, including mass
lesion or mass effect, hydrocephalus, extra-axial fluid collection,
midline shift, hemorrhage, or acute infarction.

The visualized bony calvarium is unremarkable.
IMPRESSION: No evidence of acute intracranial abnormality.

## 2015-08-07 IMAGING — CR DG CHEST 2V
2 series · 2 of 2 positions shown · non-contrast
Comparison: 12/05/2012

CLINICAL DATA: Syncope, weakness, dizziness and cough.

EXAM:
CHEST  2 VIEW

[w chest pa]
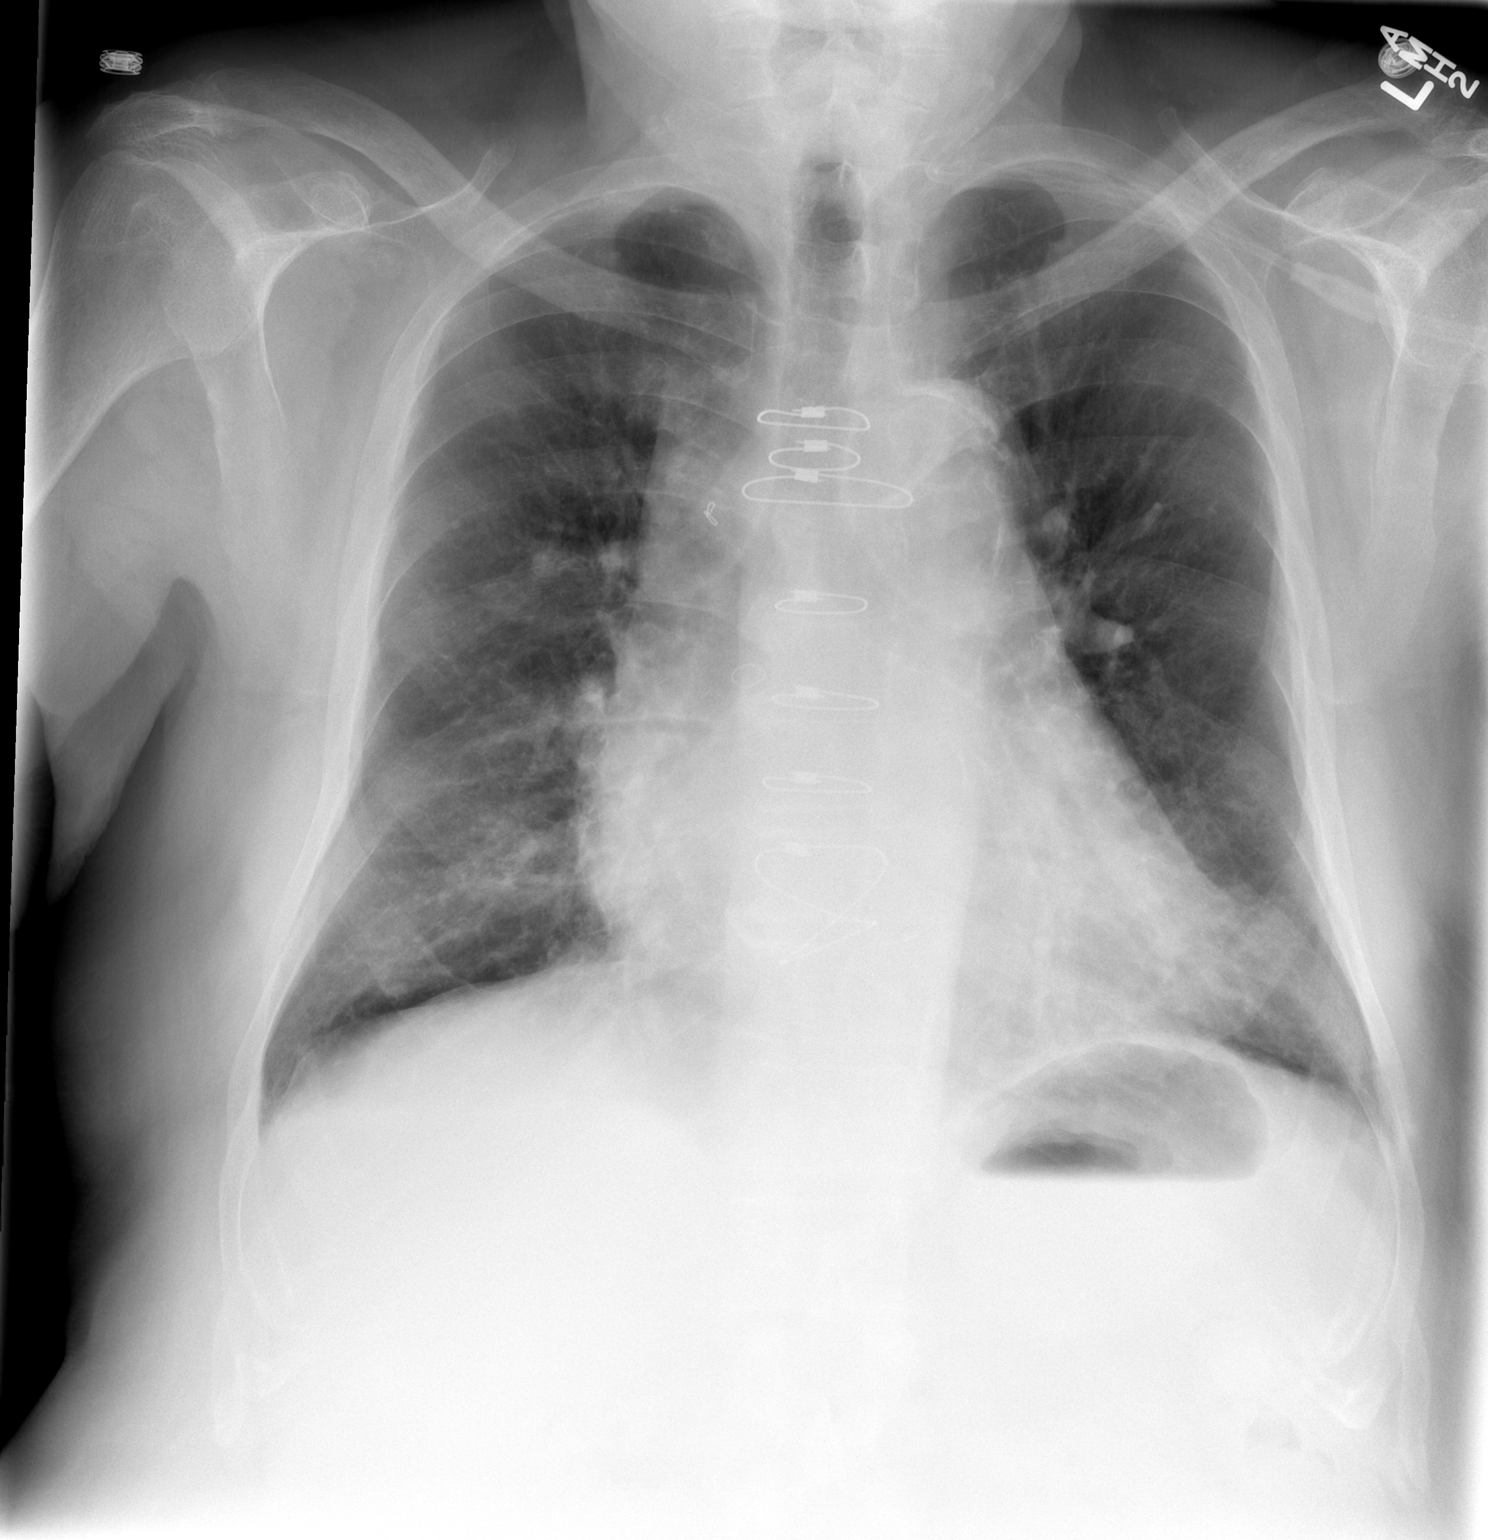

[w chest lat]
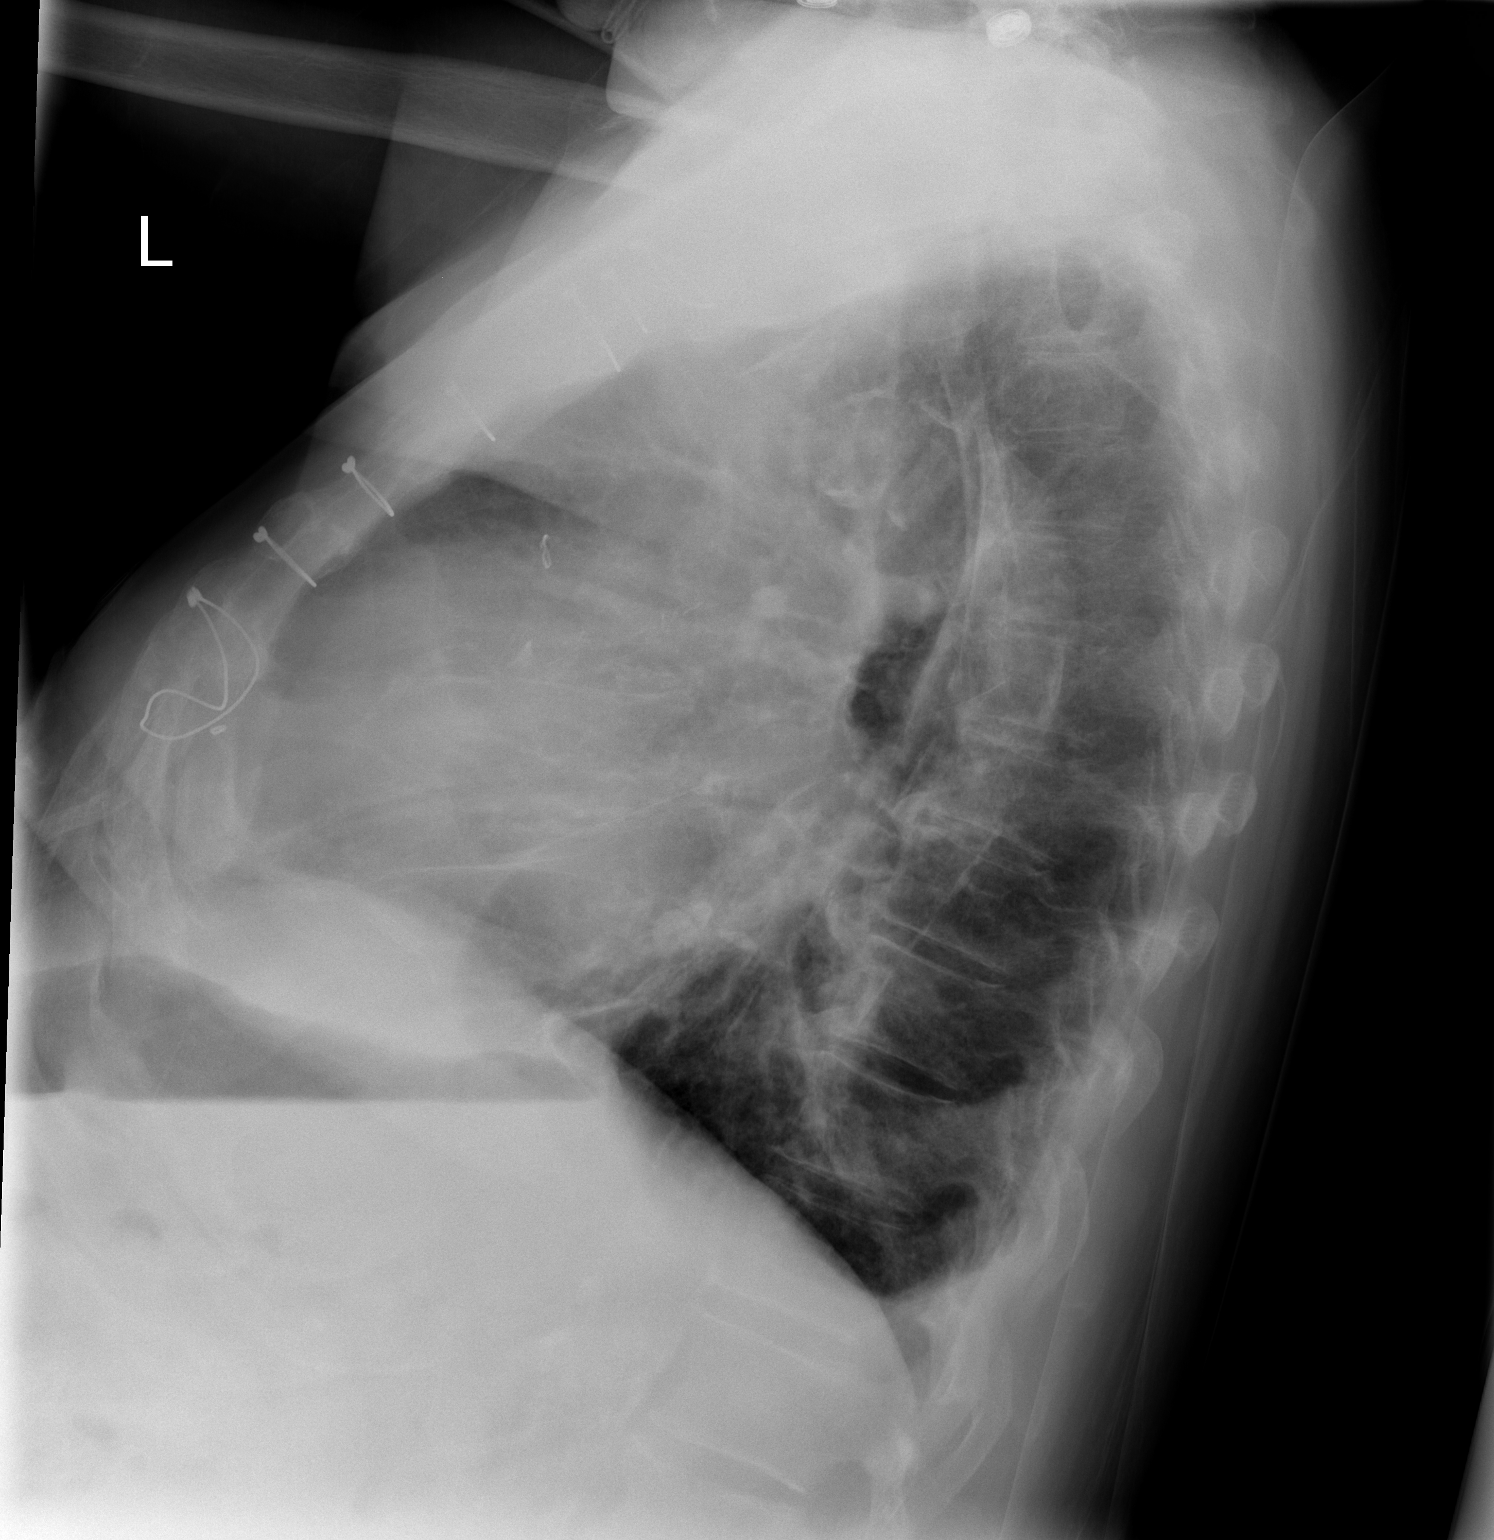

[2 of 2 positions shown; findings below may reference images not displayed]

FINDINGS: The heart is enlarged but stable. There are surgical changes from
bypass surgery. There is tortuosity and calcification of the
thoracic aorta. There are chronic bronchitic type interstitial lung
changes but no definite acute overlying infiltrates or effusions.
IMPRESSION: Cardiac enlargement and chronic lung changes but no acute overlying
pulmonary findings.

## 2015-08-09 ENCOUNTER — Ambulatory Visit (HOSPITAL_COMMUNITY)
Admission: RE | Admit: 2015-08-09 | Discharge: 2015-08-09 | Disposition: A | Discharge status: Home / Self Care | Payer: Medicare Other | Source: Ambulatory Visit | Attending: Gastroenterology | Admitting: Gastroenterology

## 2015-08-09 DIAGNOSIS — R188 Other ascites: Secondary | ICD-10-CM | POA: Insufficient documentation

## 2015-08-09 NOTE — Progress Notes (Signed)
Paracentesis complete no signs of distress. 5000ml amber colored ascites removed.  

## 2015-08-10 ENCOUNTER — Other Ambulatory Visit (HOSPITAL_COMMUNITY): Payer: Medicare Other

## 2015-08-19 ENCOUNTER — Ambulatory Visit (INDEPENDENT_AMBULATORY_CARE_PROVIDER_SITE_OTHER): Payer: Medicare Other | Admitting: Cardiology

## 2015-08-19 ENCOUNTER — Encounter: Payer: Self-pay | Admitting: Cardiology

## 2015-08-19 VITALS — BP 111/71 | HR 64 | Ht 70.0 in | Wt 191.0 lb

## 2015-08-19 DIAGNOSIS — E785 Hyperlipidemia, unspecified: Secondary | ICD-10-CM

## 2015-08-19 DIAGNOSIS — I5032 Chronic diastolic (congestive) heart failure: Secondary | ICD-10-CM

## 2015-08-19 DIAGNOSIS — I2781 Cor pulmonale (chronic): Secondary | ICD-10-CM

## 2015-08-19 DIAGNOSIS — I272 Other secondary pulmonary hypertension: Secondary | ICD-10-CM

## 2015-08-19 DIAGNOSIS — I6523 Occlusion and stenosis of bilateral carotid arteries: Secondary | ICD-10-CM | POA: Diagnosis not present

## 2015-08-19 DIAGNOSIS — I2510 Atherosclerotic heart disease of native coronary artery without angina pectoris: Secondary | ICD-10-CM

## 2015-08-19 NOTE — Progress Notes (Signed)
Patient ID: Brendan Arroyo, male   DOB: Feb 18, 1929, 81 y.o.   MRN: [id number]     Clinical Summary Brendan Arroyo is a 81 y.o.male seen today for follow up of the following medical problems.   1. Chronic diastolic heart failure/Chronic right ventricular systolic dysfunction/Severe pulm HTN - echo 04/2014 LVEF 60-65%, grade I diastolic dysfunction, severe RV dysfunction, moderate TR with PASP 101 - diuretics previously have been limited due to syncope thought related to orthostatic hypotension July 2015.  - he requires occasional paracentesis to remove his ascites, most recently he reports 7 liters were removed. Weight came down to the low 180s and has been stable.  - he is taking torsemide  tid, metolazone 2.5mg  once weekly.     2. CAD - remote hx of angioplasty over 20 years ago. CABG in 1993. - last cath Jan 2010 LM patent, LAD occluded, LIMA-LAD patent, LCX patent, OM1 40 ostial, patent AV circumflex stent, OM2 60% and stable, RCA 50-60% mid. Patent SVG-RCA with patent stent at its insertion, LVEF normal.  - from notes he has been continued on longterm plavix as opposed to ASA, he is not sure why. [name] 01/2014 low risk, no evidence of ischemia or scar, LVEF 70%  - since our last visit he denies any significant chest pain. Chronic SOB/DOE which is unchanged.    3. Carotid stenosis - mild bilateral disease on last [place] 04/2014 - denies any neurological symptoms  4 . Hyperlipidemia - compliant with lipitor  daily   5. COPD - on chronic home oxygen - followed by Dr Orson Aloe pulmonary   6. OSA - compliant with CPAP  7. Cirrhosis - followed by GI, ongoing workup for etiology  8. Hernia - did not go for surgery due to high risk due to multiple advanced comorbidities.   9 CKD - followed by Dr Fausto Skillern. Cr down from 1.8 to 1.5 from last check. Past Medical History  Diagnosis Date  . Dyslipidemia   . HTN (hypertension)   . DM (diabetes mellitus)   . Carotid  artery disease     a. Duplex 04/2014: suggest upper range 1-39% BICA.  Marland Kitchen CAD (coronary artery disease)     a. s/p very remote angioplasty, CABG 1993. b. H/o DES to dRCA at anastamotic site of VG 2005, DES to OM 2008. c. Low risk nuc 01/2014.  . H/O: gout   . Diverticulitis   . Colon polyps   . Blood transfusion     '93  . GERD (gastroesophageal reflux disease)   . Hyperlipidemia   . Renal insufficiency     a. OP Cr 04/10/14: 1.32.  Marland Kitchen Stricture and stenosis of esophagus 11/06/2011    egd with esoph stricture dilation 11/2011.  [name]  . Esophageal reflux 11/06/2011  . COPD (chronic obstructive pulmonary disease)     a. Oxygen use 2l/m nasally usually nightly and during day as needed.     No Known Allergies   Current Outpatient Prescriptions  Medication Sig Dispense Refill  . allopurinol (ZYLOPRIM) 100 MG tablet Take 200 mg by mouth.    Marland Kitchen atorvastatin (LIPITOR) 40 MG tablet Take 20 mg by mouth every morning.    . budesonide-formoterol (SYMBICORT) 160-4.5 MCG/ACT inhaler Inhale 2 puffs into the lungs 2 (two) times daily.    . cetirizine (ZYRTEC) 10 MG tablet Take 10 mg by mouth daily.    . clopidogrel (PLAVIX) 75 MG tablet Take 75 mg by mouth daily.    . Cyanocobalamin (  VITAMIN B-12 IJ) Inject 1,000 mcg as directed every 30 (thirty) days.    Marland Kitchen gabapentin (NEURONTIN) 300 MG capsule Take 300 mg by mouth at bedtime.    Marland Kitchen glipiZIDE (GLUCOTROL XL) 5 MG 24 hr tablet Take 2.5 mg by mouth daily with breakfast.     . guaifenesin (HUMIBID E) 400 MG TABS tablet Take 400 mg by mouth 2 (two) times daily.    Marland Kitchen HYDROcodone-acetaminophen (NORCO/VICODIN) 5-325 MG per tablet Take 1 tablet by mouth every 6 (six) hours as needed for moderate pain.    . metolazone (ZAROXOLYN) 2.5 MG tablet TAKE 1 TIME WEEKLY AS NEEDED FOR SHORTNESS OF BREATH OR SWELLING 30 tablet 3  . metoprolol tartrate (LOPRESSOR) 25 MG tablet Take 25 mg by mouth 2 (two) times daily.    . Misc. Devices MISC Patient needs portable  oxygen canister Dx :CHF    . Multiple Vitamins-Minerals ([employer] SPECTRAVITE SENIOR) TABS Take 1 tablet by mouth daily.      . nitroGLYCERIN (NITROSTAT) 0.4 MG SL tablet Place 0.4 mg under the tongue every 5 (five) minutes as needed for chest pain.    Marland Kitchen Potassium Chloride ER 20 MEQ TBCR Take 20 mEq by mouth 2 (two) times daily.    Marland Kitchen spironolactone (ALDACTONE) 25 MG tablet Take 25 mg by mouth 2 (two) times daily.    Marland Kitchen tiotropium (SPIRIVA) 18 MCG inhalation capsule Place 18 mcg into inhaler and inhale daily.    Marland Kitchen torsemide (DEMADEX) 20 MG tablet Take 20 mg by mouth 3 (three) times daily.     No current facility-administered medications for this visit.     Past Surgical History  Procedure Laterality Date  . Cabg  1993  . Coronary stent placement  July 2005, 2008    taxus stent of the distal right coronary artery   . Elbow surgery      right  . Tonsillectomy    . Back surgery    . Esophagogastroduodenoscopy  07/31/2012    Procedure: ESOPHAGOGASTRODUODENOSCOPY (EGD);  Surgeon: Meryl Dare, MD,FACG;  Location: [facility] ENDOSCOPY;  Service: Endoscopy;  Laterality: N/A;  pt has food impaction, takes plavix, so no plans to dilate.  . Coronary artery bypass graft    . Esophagogastroduodenoscopy N/A 01/07/2014    Dr. Arlyce Dice: peptic esophageal stricture s/p dilation  . Balloon dilation N/A 01/07/2014    Procedure: BALLOON DILATION;  Surgeon: Louis Meckel, MD;  Location: [facility] ENDOSCOPY;  Service: Endoscopy;  Laterality: N/A;  . Esophagogastroduodenoscopy N/A 03/09/2014    Dr. Arlyce Dice: esophageal stricture s/p dilation  . Balloon dilation N/A 03/09/2014    Procedure: BALLOON DILATION;  Surgeon: Louis Meckel, MD;  Location: [facility] ENDOSCOPY;  Service: Endoscopy;  Laterality: N/A;  . Yag laser application Right 11/30/2014    Procedure: YAG LASER APPLICATION;  Surgeon: Susa Simmonds, MD;  Location: [facility] ORS;  Service: Ophthalmology;  Laterality: Right;     No Known Allergies    Family History  Problem  Relation Age of Onset  . Ovarian cancer Mother   . Diabetes Mother   . Diabetes Brother   . Heart disease Maternal Grandfather   . Colon cancer Neg Hx   . Esophageal cancer Neg Hx   . Stomach cancer Neg Hx      Social History Mr. Haithcock reports that he quit smoking about 5 years ago. His smoking use included Cigarettes. He started smoking about 71 years ago. He has a 66 pack-year smoking history. He has never used smokeless  tobacco. [name] reports that he drinks about 2.4 oz of alcohol per week.   Review of Systems CONSTITUTIONAL: No weight loss, fever, chills, weakness or fatigue.  HEENT: Eyes: No visual loss, blurred vision, double vision or yellow sclerae.No hearing loss, sneezing, congestion, runny nose or sore throat.  SKIN: No rash or itching.  CARDIOVASCULAR: per HPI RESPIRATORY: No cough or sputum.  GASTROINTESTINAL: No anorexia, nausea, vomiting or diarrhea. No abdominal pain or blood.  GENITOURINARY: No burning on urination, no polyuria NEUROLOGICAL: No headache, dizziness, syncope, paralysis, ataxia, numbness or tingling in the extremities. No change in bowel or bladder control.  MUSCULOSKELETAL: No muscle, back pain, joint pain or stiffness.  LYMPHATICS: No enlarged nodes. No history of splenectomy.  PSYCHIATRIC: No history of depression or anxiety.  ENDOCRINOLOGIC: No reports of sweating, cold or heat intolerance. No polyuria or polydipsia.  Marland Kitchen.   Physical Examination Filed Vitals:   08/19/15 1016  BP: 111/71  Pulse: 64   Filed Vitals:   08/19/15 1016  Height: 5\' 10"  (1.778 m)  Weight: 191 lb (86.637 kg)    Gen: resting comfortably, no acute distress HEENT: no scleral icterus, pupils equal round and reactive, no palptable cervical adenopathy,  CV: RRR, 3/6 systolic murmur LLSB, no jvd Resp: Clear to auscultation bilaterally GI: abdomen is soft, non-tender, non-distended, normal bowel sounds, no hepatosplenomegaly MSK: extremities are warm, no edema.    Skin: warm, no rash Neuro:  no focal deficits Psych: appropriate affect   Diagnostic Studies  Cath Jan 2010 HEMODYNAMIC DATA:  1. The central aortic pressure is 152/61, mean 93.  2. Left ventricular pressure 164/5.  3. There was no gradient on pullback across aortic valve.  ANGIOGRAPHIC DATA:  1. The left main is free of critical disease.  2. The left anterior descending artery is totally occluded after a  small diagonal.  3. The internal mammary to the diagonal and distal LAD appears to be  intact, with good runoff into both branches. There is disease  between the 2 branches.  4. The native circumflex is calcified. There is a first marginal that  has about 40% ostial narrowing, but is fairly small in caliber.  The AV circumflex after the takeoff of the second marginal is  previously stented and there is no evidence of any significant  restenosis at this site. The second marginal itself has an  eccentric plaque of about 60% noted in the midportion of that  artery. It has been previously visualized, and compared to  previous study, I do not observe a substantial change. Distally,  the AV circumflex has about 50% eccentric bend leading into the  fairly large third marginal Londen Bok.  5. The right coronary artery has a 50-60% mid stenosis. The vessel is  then diffusely diseased distally, and there is competitive filling.  6. The vein graft at its inserts into the distal right covering the PD  and PLA system appears to be widely patent. The stent itself is  patent as well. There is maybe 40% in the continuation Obrian Bulson.  7. The ventriculogram by hand only suggests preserved LV function.  CONCLUSION:  1. Preserved overall left ventricular function.  2. Continued patency of the internal mammary to the diagonal LAD.  3. Continued patency of the saphenous vein graft to the distal right  with continued patency of the stent at its insertion.  4. Continued  patency of the stent to the mid circumflex.  5. Moderate lesions of the OM-2 and distal AV circumflex  as described  above.  DISPOSITION: I have carefully reviewed the old films and compared them  to the current studies. The circumflex OM lesions do not appear to be  substantially progressed. It potentially could be the source of  ischemia, but does not appear to be unstable as such, given the current  anatomy I am inclined to treat him medically at the present time. We  will see how he does first and I will see him back in followup in the  office soon.   04/2014 Carotid US Summary: Findings suggest upper range 1-39% internal carotid artery stenosis bilaterally. Vertebral arteries are patent with antegrade flow.  Other specific details can be found in the table(s) above. Prepared and Electronically Authenticated by   01/2014 MPI Impression  Exercise Capacity: Lexiscan with no exercise.  BP Response: Normal blood pressure response.  Clinical Symptoms: No chest pain.  ECG Impression: No change from baseline pattern of widespread T wave inversion.  Comparison with Prior Nuclear Study: No images to compare  Overall Impression: Low risk stress nuclear study. No evidence of ischemia or scar. No new EKG changes with stress. Normal LV systolic function.  LV Ejection Fraction: 70%. LV Wall Motion: Normal Wall Motion  04/2014 Echo Study Conclusions  - Left ventricle: The cavity size was normal. There was mild focal basal hypertrophy of the septum. Systolic function was normal. The estimated ejection fraction was in the range of 60% to 65%. Wall motion was normal; there were no regional wall motion abnormalities. There was an increased relative contribution of atrial contraction to ventricular filling. Doppler parameters are consistent with abnormal left ventricular relaxation (grade 1 diastolic dysfunction). - Mitral valve: Calcified annulus. There was trivial  regurgitation. - Left atrium: The atrium was mildly dilated. - Right ventricle: The cavity size was moderately dilated. Wall thickness was normal. Systolic function was severely reduced. - Right atrium: The atrium was severely dilated. - Tricuspid valve: There was moderate regurgitation. - Pulmonic valve: There was trivial regurgitation. - Pulmonary arteries: PA peak pressure: 101 mm Hg (S).  Impressions:  - The right ventricular systolic pressure was increased consistent with severe pulmonary hypertension.    Assessment and Plan  1. Severe pulmonary hypertension with right sided dysfunction/Chronic diastolic heart failure - cor pulmonale due to chronic severe COPD with chronic hypoxia, OSA, left isded dysfunction may also contribute some to elevated pulm pressures -has had multiple large volume paracentesis with significant improvement in symptoms.  -we will  continue current diuretics.   2. CAD - Lexiscan MPI 01/2014 negative for ischemia - denies any recet chest pain, continue risk factor modification and secondary prevention  3. Carotid stenosis - mild bilateral disease by recent US, no symptoms - continue to follow clinically  4. Hyperlipidemia - continue current statin  5. COPD - management per pulmonary  6. OSA - continue CPAP    F/u 4 months  Antoine Poche, M.D.

## 2015-08-19 NOTE — Patient Instructions (Signed)
Your physician recommends that you schedule a follow-up appointment in: 4 MONTHS WITH DR BRANCH  Your physician recommends that you continue on your current medications as directed. Please refer to the Current Medication list given to you today.  Thank you for choosing [facility]!!    

## 2015-08-31 ENCOUNTER — Encounter: Payer: Self-pay | Admitting: Gastroenterology

## 2015-08-31 ENCOUNTER — Ambulatory Visit (INDEPENDENT_AMBULATORY_CARE_PROVIDER_SITE_OTHER): Payer: Medicare Other | Admitting: Gastroenterology

## 2015-08-31 VITALS — BP 102/62 | HR 65 | Temp 97.3°F | Ht 69.0 in | Wt 188.4 lb

## 2015-08-31 DIAGNOSIS — K7460 Unspecified cirrhosis of liver: Secondary | ICD-10-CM | POA: Diagnosis not present

## 2015-08-31 DIAGNOSIS — I6523 Occlusion and stenosis of bilateral carotid arteries: Secondary | ICD-10-CM | POA: Diagnosis not present

## 2015-08-31 NOTE — Progress Notes (Signed)
Referring Provider: Joette Catching, MD Primary Care Physician:  [name], MD Primary GI: Dr. Jena Gauss   Chief Complaint  Patient presents with  . Cirrhosis    HPI:   Brendan Arroyo is a 81 y.o. male presenting today with a history of ascites, chronic diastolic heart failure, with new findings of cirrhosis on [place] July 2016. Has had multiple paracentesis. Most recently 11/7 with 5 liters removed. Outside labs negative for autoimmune hepatitis, iron mildly low at 48, ferritin normal at 126, INR 1, need Hep C antibody results. EGD last done June 2015.   States he feels better since recent paracentesis. Diuretics managed by cardiology. Significant cardiac history. Keeps daily weights at home. No abdominal pain. No confusion. Hasn't had Hep A or B vaccination completed. Sees Dr. Lysbeth Galas next week.   Past Medical History  Diagnosis Date  . Dyslipidemia   . HTN (hypertension)   . DM (diabetes mellitus) (HCC)   . Carotid artery disease (HCC)     a. Duplex 04/2014: suggest upper range 1-39% BICA.  Marland Kitchen CAD (coronary artery disease)     a. s/p very remote angioplasty, CABG 1993. b. H/o DES to dRCA at anastamotic site of VG 2005, DES to OM 2008. c. Low risk nuc 01/2014.  . H/O: gout   . Diverticulitis   . Colon polyps   . Blood transfusion     '93  . GERD (gastroesophageal reflux disease)   . Hyperlipidemia   . Renal insufficiency     a. OP Cr 04/10/14: 1.32.  Marland Kitchen Stricture and stenosis of esophagus 11/06/2011    egd with esoph stricture dilation 11/2011.  [name]  . Esophageal reflux 11/06/2011  . COPD (chronic obstructive pulmonary disease) (HCC)     a. Oxygen use 2l/m nasally usually nightly and during day as needed.    Past Surgical History  Procedure Laterality Date  . Cabg  1993  . Coronary stent placement  July 2005, 2008    taxus stent of the distal right coronary artery   . Elbow surgery      right  . Tonsillectomy    . Back surgery    .  Esophagogastroduodenoscopy  07/31/2012    Procedure: ESOPHAGOGASTRODUODENOSCOPY (EGD);  Surgeon: Meryl Dare, MD,FACG;  Location[facility] ENDOSCOPY;  Service: Endoscopy;  Laterality: N/A;  pt has food impaction, takes plavix, so no plans to dilate.  . Coronary artery bypass graft    . Esophagogastroduodenoscopy N/A 01/07/2014    Dr. Arlyce Dice: peptic esophageal stricture s/p dilation  . Balloon dilation N/A 01/07/2014    Procedure: BALLOON DILATION;  Surgeon: Louis Meckel, MD;  Location: [facility] ENDOSCOPY;  Service: Endoscopy;  Laterality: N/A;  . Esophagogastroduodenoscopy N/A 03/09/2014    Dr. Arlyce Dice: esophageal stricture s/p dilation  . Balloon dilation N/A 03/09/2014    Procedure: BALLOON DILATION;  Surgeon: Louis Meckel, MD;  Location: [facility] ENDOSCOPY;  Service: Endoscopy;  Laterality: N/A;  . Yag laser application Right 11/30/2014    Procedure: YAG LASER APPLICATION;  Surgeon: Susa Simmonds, MD;  Location: [facility] ORS;  Service: Ophthalmology;  Laterality: Right;    Current Outpatient Prescriptions  Medication Sig Dispense Refill  . atorvastatin (LIPITOR) 40 MG tablet Take 20 mg by mouth every morning.    . budesonide-formoterol (SYMBICORT) 80-4.5 MCG/ACT inhaler Inhale 2 puffs into the lungs 2 (two) times daily.    . cetirizine (ZYRTEC) 10 MG tablet Take 10 mg by mouth daily.    . clopidogrel (  PLAVIX) 75 MG tablet Take 75 mg by mouth daily.    . Cyanocobalamin (VITAMIN B-12 IJ) Inject 1,000 mcg as directed every 30 (thirty) days.    Marland Kitchen. gabapentin (NEURONTIN) 300 MG capsule Take 300 mg by mouth at bedtime.    Marland Kitchen. glipiZIDE (GLUCOTROL XL) 5 MG 24 hr tablet Take 2.5 mg by mouth daily with breakfast.     . guaifenesin (HUMIBID E) 400 MG TABS tablet Take 400 mg by mouth 2 (two) times daily.    Marland Kitchen. HYDROcodone-acetaminophen (NORCO/VICODIN) 5-325 MG per tablet Take 1 tablet by mouth every 6 (six) hours as needed for moderate pain.    . metolazone (ZAROXOLYN) 2.5 MG tablet TAKE 1 TIME WEEKLY AS NEEDED FOR  SHORTNESS OF BREATH OR SWELLING 30 tablet 3  . metoprolol tartrate (LOPRESSOR) 25 MG tablet Take 25 mg by mouth 2 (two) times daily.    . Misc. Devices MISC Patient needs portable oxygen canister Dx :CHF    . Multiple Vitamins-Minerals ([employer] SPECTRAVITE SENIOR) TABS Take 1 tablet by mouth daily.      . nitroGLYCERIN (NITROSTAT) 0.4 MG SL tablet Place 0.4 mg under the tongue every 5 (five) minutes as needed for chest pain.    Marland Kitchen. Potassium Chloride ER 20 MEQ TBCR Take 20 mEq by mouth 2 (two) times daily.    Marland Kitchen. spironolactone (ALDACTONE) 25 MG tablet Take 25 mg by mouth 2 (two) times daily.    Marland Kitchen. tiotropium (SPIRIVA) 18 MCG inhalation capsule Place 18 mcg into inhaler and inhale daily.    Marland Kitchen. torsemide (DEMADEX) 20 MG tablet Take 20 mg by mouth 3 (three) times daily.     No current facility-administered medications for this visit.    Allergies as of 08/31/2015  . (No Known Allergies)    Family History  Problem Relation Age of Onset  . Ovarian cancer Mother   . Diabetes Mother   . Diabetes Brother   . Heart disease Maternal Grandfather   . Colon cancer Neg Hx   . Esophageal cancer Neg Hx   . Stomach cancer Neg Hx     Social History   Social History  . Marital Status: Widowed    Spouse Name: N/A  . Number of Children: 1  . Years of Education: N/A   Occupational History  . retired    Social History Main Topics  . Smoking status: Former Smoker -- 1.00 packs/day for 66 years    Types: Cigarettes    Start date: 02/25/1944    Quit date: 10/02/2009  . Smokeless tobacco: Never Used     Comment: Quit 2011?. has a 40 pack-year history   . Alcohol Use: 2.4 oz/week    4 Shots of liquor per week     Comment: social -occ.Has had drink of choice as beer in past, some liquor  . Drug Use: No  . Sexual Activity: Not Asked   Other Topics Concern  . None   Social History Narrative   [place] in [place] and is retired. Tries to eat a heart health diet and exercises regularly.     Review  of Systems: As mentioned in HPI   Physical Exam: BP 102/62 mmHg  Pulse 65  Temp(Src) 97.3 F (36.3 C) (Oral)  Ht 5\' 9"  (1.753 m)  Wt 188 lb 6.4 oz (85.458 kg)  BMI 27.81 kg/m2 General:   Alert and oriented. 2 liters [place].  Head:  Normocephalic and atraumatic. Abdomen:  +BS, soft, obese, no ascites. Small umbilical hernia. non-tender. Liver  margin palpable below right costal margin Extremities:  Without edema. Neurologic:  Alert and  oriented x4;  grossly normal neurologically. Psych:  Alert and cooperative. Normal mood and affect.

## 2015-08-31 NOTE — Assessment & Plan Note (Addendum)
81 year old male with cirrhosis, likely multifactorial in setting of chronic heart failure, fatty liver, history of ETOH abuse. Need for serial paracentesis noted. Diuretics managed by cardiology. Consider repeat EGD for variceal screening in 2017 or so. Hep A and B vaccinations to be completed with PCP next week hopefully. Obtain Hep C antibody. Return in 6 months.

## 2015-08-31 NOTE — Patient Instructions (Signed)
Please have blood work done.   We will see you in 6 months!  Let me know if you need any fluid removed from your belly in the meantime.

## 2015-09-01 NOTE — Progress Notes (Signed)
cc'ed to pcp °

## 2015-09-24 ENCOUNTER — Inpatient Hospital Stay (HOSPITAL_COMMUNITY)
Admission: EM | Admit: 2015-09-24 | Discharge: 2015-09-29 | DRG: 291 | Disposition: A | Discharge status: Skilled Nursing Facility | Payer: Medicare Other | Attending: Internal Medicine | Admitting: Internal Medicine

## 2015-09-24 ENCOUNTER — Inpatient Hospital Stay (HOSPITAL_COMMUNITY): Payer: Medicare Other

## 2015-09-24 ENCOUNTER — Encounter (HOSPITAL_COMMUNITY): Payer: Self-pay | Admitting: Neurology

## 2015-09-24 ENCOUNTER — Emergency Department (HOSPITAL_COMMUNITY): Payer: Medicare Other

## 2015-09-24 ENCOUNTER — Observation Stay (HOSPITAL_BASED_OUTPATIENT_CLINIC_OR_DEPARTMENT_OTHER): Payer: Medicare Other

## 2015-09-24 DIAGNOSIS — K5900 Constipation, unspecified: Secondary | ICD-10-CM | POA: Diagnosis not present

## 2015-09-24 DIAGNOSIS — N179 Acute kidney failure, unspecified: Secondary | ICD-10-CM

## 2015-09-24 DIAGNOSIS — I5032 Chronic diastolic (congestive) heart failure: Secondary | ICD-10-CM

## 2015-09-24 DIAGNOSIS — Z951 Presence of aortocoronary bypass graft: Secondary | ICD-10-CM

## 2015-09-24 DIAGNOSIS — N189 Chronic kidney disease, unspecified: Secondary | ICD-10-CM

## 2015-09-24 DIAGNOSIS — I25810 Atherosclerosis of coronary artery bypass graft(s) without angina pectoris: Secondary | ICD-10-CM | POA: Diagnosis not present

## 2015-09-24 DIAGNOSIS — R531 Weakness: Secondary | ICD-10-CM | POA: Diagnosis not present

## 2015-09-24 DIAGNOSIS — K7460 Unspecified cirrhosis of liver: Secondary | ICD-10-CM | POA: Diagnosis present

## 2015-09-24 DIAGNOSIS — G4733 Obstructive sleep apnea (adult) (pediatric): Secondary | ICD-10-CM | POA: Diagnosis present

## 2015-09-24 DIAGNOSIS — Z23 Encounter for immunization: Secondary | ICD-10-CM | POA: Diagnosis present

## 2015-09-24 DIAGNOSIS — I272 Other secondary pulmonary hypertension: Secondary | ICD-10-CM | POA: Diagnosis present

## 2015-09-24 DIAGNOSIS — N19 Unspecified kidney failure: Secondary | ICD-10-CM

## 2015-09-24 DIAGNOSIS — R188 Other ascites: Secondary | ICD-10-CM

## 2015-09-24 DIAGNOSIS — I130 Hypertensive heart and chronic kidney disease with heart failure and stage 1 through stage 4 chronic kidney disease, or unspecified chronic kidney disease: Secondary | ICD-10-CM | POA: Diagnosis not present

## 2015-09-24 DIAGNOSIS — I270 Primary pulmonary hypertension: Secondary | ICD-10-CM | POA: Diagnosis not present

## 2015-09-24 DIAGNOSIS — Z955 Presence of coronary angioplasty implant and graft: Secondary | ICD-10-CM

## 2015-09-24 DIAGNOSIS — J449 Chronic obstructive pulmonary disease, unspecified: Secondary | ICD-10-CM | POA: Diagnosis present

## 2015-09-24 DIAGNOSIS — I071 Rheumatic tricuspid insufficiency: Secondary | ICD-10-CM | POA: Diagnosis present

## 2015-09-24 DIAGNOSIS — I10 Essential (primary) hypertension: Secondary | ICD-10-CM | POA: Diagnosis present

## 2015-09-24 DIAGNOSIS — N184 Chronic kidney disease, stage 4 (severe): Secondary | ICD-10-CM | POA: Diagnosis present

## 2015-09-24 DIAGNOSIS — I951 Orthostatic hypotension: Secondary | ICD-10-CM | POA: Diagnosis present

## 2015-09-24 DIAGNOSIS — E785 Hyperlipidemia, unspecified: Secondary | ICD-10-CM | POA: Insufficient documentation

## 2015-09-24 DIAGNOSIS — Z7902 Long term (current) use of antithrombotics/antiplatelets: Secondary | ICD-10-CM

## 2015-09-24 DIAGNOSIS — E1122 Type 2 diabetes mellitus with diabetic chronic kidney disease: Secondary | ICD-10-CM | POA: Diagnosis present

## 2015-09-24 DIAGNOSIS — J9611 Chronic respiratory failure with hypoxia: Secondary | ICD-10-CM | POA: Diagnosis present

## 2015-09-24 DIAGNOSIS — I5033 Acute on chronic diastolic (congestive) heart failure: Secondary | ICD-10-CM | POA: Diagnosis present

## 2015-09-24 DIAGNOSIS — E875 Hyperkalemia: Secondary | ICD-10-CM

## 2015-09-24 DIAGNOSIS — E11649 Type 2 diabetes mellitus with hypoglycemia without coma: Secondary | ICD-10-CM | POA: Diagnosis present

## 2015-09-24 DIAGNOSIS — I519 Heart disease, unspecified: Secondary | ICD-10-CM

## 2015-09-24 DIAGNOSIS — E871 Hypo-osmolality and hyponatremia: Secondary | ICD-10-CM

## 2015-09-24 DIAGNOSIS — Z7984 Long term (current) use of oral hypoglycemic drugs: Secondary | ICD-10-CM

## 2015-09-24 DIAGNOSIS — H9190 Unspecified hearing loss, unspecified ear: Secondary | ICD-10-CM | POA: Diagnosis present

## 2015-09-24 DIAGNOSIS — Z9981 Dependence on supplemental oxygen: Secondary | ICD-10-CM

## 2015-09-24 DIAGNOSIS — I4892 Unspecified atrial flutter: Secondary | ICD-10-CM

## 2015-09-24 DIAGNOSIS — D649 Anemia, unspecified: Secondary | ICD-10-CM | POA: Diagnosis present

## 2015-09-24 DIAGNOSIS — I2510 Atherosclerotic heart disease of native coronary artery without angina pectoris: Secondary | ICD-10-CM | POA: Diagnosis present

## 2015-09-24 DIAGNOSIS — Z87891 Personal history of nicotine dependence: Secondary | ICD-10-CM

## 2015-09-24 DIAGNOSIS — E119 Type 2 diabetes mellitus without complications: Secondary | ICD-10-CM

## 2015-09-24 DIAGNOSIS — I5189 Other ill-defined heart diseases: Secondary | ICD-10-CM | POA: Diagnosis present

## 2015-09-24 DIAGNOSIS — T370X5A Adverse effect of sulfonamides, initial encounter: Secondary | ICD-10-CM | POA: Diagnosis present

## 2015-09-24 HISTORY — DX: Other ascites: R18.8

## 2015-09-24 HISTORY — DX: Right heart failure, unspecified: I50.810

## 2015-09-24 HISTORY — DX: Chronic diastolic (congestive) heart failure: I50.32

## 2015-09-24 HISTORY — DX: Pulmonary hypertension, unspecified: I27.20

## 2015-09-24 HISTORY — DX: Chronic respiratory failure, unspecified whether with hypoxia or hypercapnia: J96.10

## 2015-09-24 HISTORY — DX: Unspecified cirrhosis of liver: K74.60

## 2015-09-24 LAB — COMPREHENSIVE METABOLIC PANEL
ALBUMIN: 3.1 g/dL — AB (ref 3.5–5.0)
ALK PHOS: 93 U/L (ref 38–126)
ALT: 18 U/L (ref 17–63)
ANION GAP: 8 (ref 5–15)
AST: 31 U/L (ref 15–41)
BUN: 68 mg/dL — AB (ref 6–20)
CALCIUM: 8.8 mg/dL — AB (ref 8.9–10.3)
CO2: 23 mmol/L (ref 22–32)
Chloride: 90 mmol/L — ABNORMAL LOW (ref 101–111)
Creatinine, Ser: 3.21 mg/dL — ABNORMAL HIGH (ref 0.61–1.24)
GFR calc Af Amer: 19 mL/min — ABNORMAL LOW (ref >60)
GFR calc non Af Amer: 16 mL/min — ABNORMAL LOW (ref >60)
GLUCOSE: 103 mg/dL — AB (ref 65–99)
Potassium: 6.1 mmol/L (ref 3.5–5.1)
SODIUM: 121 mmol/L — AB (ref 135–145)
Total Bilirubin: 1.6 mg/dL — ABNORMAL HIGH (ref 0.3–1.2)
Total Protein: 6.1 g/dL — ABNORMAL LOW (ref 6.5–8.1)

## 2015-09-24 LAB — PROTIME-INR
INR: 1.17 (ref 0.00–1.49)
Prothrombin Time: 15 seconds (ref 11.6–15.2)

## 2015-09-24 LAB — CBG MONITORING, ED: Glucose-Capillary: 137 mg/dL — ABNORMAL HIGH (ref 65–99)

## 2015-09-24 LAB — CBC WITH DIFFERENTIAL/PLATELET
BASOS ABS: 0 10*3/uL (ref 0.0–0.1)
Basophils Relative: 1 %
Eosinophils Absolute: 0.5 10*3/uL (ref 0.0–0.7)
Eosinophils Relative: 6 %
HCT: 35.1 % — ABNORMAL LOW (ref 39.0–52.0)
HEMOGLOBIN: 12.1 g/dL — AB (ref 13.0–17.0)
LYMPHS ABS: 1.8 10*3/uL (ref 0.7–4.0)
LYMPHS PCT: 22 %
MCH: 29.5 pg (ref 26.0–34.0)
MCHC: 34.5 g/dL (ref 30.0–36.0)
MCV: 85.6 fL (ref 78.0–100.0)
MONO ABS: 1 10*3/uL (ref 0.1–1.0)
Monocytes Relative: 12 %
NEUTROS ABS: 5.1 10*3/uL (ref 1.7–7.7)
Neutrophils Relative %: 59 %
Platelets: 252 10*3/uL (ref 150–400)
RBC: 4.1 MIL/uL — AB (ref 4.22–5.81)
RDW: 14.4 % (ref 11.5–15.5)
WBC: 8.5 10*3/uL (ref 4.0–10.5)

## 2015-09-24 LAB — GLUCOSE, CAPILLARY
GLUCOSE-CAPILLARY: 79 mg/dL (ref 65–99)
GLUCOSE-CAPILLARY: 148 mg/dL — AB (ref 65–99)
GLUCOSE-CAPILLARY: 157 mg/dL — AB (ref 65–99)

## 2015-09-24 LAB — BRAIN NATRIURETIC PEPTIDE: B NATRIURETIC PEPTIDE 5: 1503.5 pg/mL — AB (ref 0.0–100.0)

## 2015-09-24 LAB — PROTEIN / CREATININE RATIO, URINE
Creatinine, Urine: 60.62 mg/dL
PROTEIN CREATININE RATIO: 0.12 mg/mg{creat} (ref 0.00–0.15)
Total Protein, Urine: 7 mg/dL

## 2015-09-24 LAB — BASIC METABOLIC PANEL
Anion gap: 8 (ref 5–15)
BUN: 64 mg/dL — ABNORMAL HIGH (ref 6–20)
CHLORIDE: 91 mmol/L — AB (ref 101–111)
CO2: 24 mmol/L (ref 22–32)
CREATININE: 2.92 mg/dL — AB (ref 0.61–1.24)
Calcium: 8.2 mg/dL — ABNORMAL LOW (ref 8.9–10.3)
GFR, EST AFRICAN AMERICAN: 21 mL/min — AB (ref >60)
GFR, EST NON AFRICAN AMERICAN: 18 mL/min — AB (ref >60)
Glucose, Bld: 183 mg/dL — ABNORMAL HIGH (ref 65–99)
POTASSIUM: 5.2 mmol/L — AB (ref 3.5–5.1)
SODIUM: 123 mmol/L — AB (ref 135–145)

## 2015-09-24 LAB — URINALYSIS, ROUTINE W REFLEX MICROSCOPIC
Bilirubin Urine: NEGATIVE
GLUCOSE, UA: NEGATIVE mg/dL
Hgb urine dipstick: NEGATIVE
KETONES UR: NEGATIVE mg/dL
LEUKOCYTES UA: NEGATIVE
Nitrite: NEGATIVE
PH: 5.5 (ref 5.0–8.0)
Protein, ur: NEGATIVE mg/dL
Specific Gravity, Urine: 1.01 (ref 1.005–1.030)

## 2015-09-24 LAB — MAGNESIUM: MAGNESIUM: 2.7 mg/dL — AB (ref 1.7–2.4)

## 2015-09-24 LAB — OSMOLALITY, URINE: OSMOLALITY UR: 243 mosm/kg — AB (ref 300–900)

## 2015-09-24 MED ORDER — INSULIN ASPART 100 UNIT/ML IV SOLN
10.0000 [IU] | Freq: Once | INTRAVENOUS | Status: AC
  Administered 2015-09-24: 10 [IU] via INTRAVENOUS
  Filled 2015-09-24: qty 1

## 2015-09-24 MED ORDER — ONDANSETRON HCL 4 MG/2ML IJ SOLN: 4.0000 mg | Freq: Four times a day (QID) | INTRAMUSCULAR | Status: DC | PRN

## 2015-09-24 MED ORDER — ACETAMINOPHEN 325 MG PO TABS
650.0000 mg | ORAL_TABLET | ORAL | Status: DC | PRN
  Administered 2015-09-24 – 2015-09-25 (×2): 650 mg via ORAL
  Filled 2015-09-24 (×2): qty 2

## 2015-09-24 MED ORDER — ATORVASTATIN CALCIUM 20 MG PO TABS
20.0000 mg | ORAL_TABLET | Freq: Every morning | ORAL | Status: DC
  Administered 2015-09-24 – 2015-09-29 (×6): 20 mg via ORAL
  Filled 2015-09-24 (×6): qty 1

## 2015-09-24 MED ORDER — INSULIN ASPART 100 UNIT/ML ~~LOC~~ SOLN
0.0000 [IU] | Freq: Every day | SUBCUTANEOUS | Status: DC
  Administered 2015-09-26 – 2015-09-28 (×2): 2 [IU] via SUBCUTANEOUS

## 2015-09-24 MED ORDER — SODIUM POLYSTYRENE SULFONATE 15 GM/60ML PO SUSP
30.0000 g | Freq: Once | ORAL | Status: AC
  Administered 2015-09-24: 30 g via ORAL
  Filled 2015-09-24: qty 120

## 2015-09-24 MED ORDER — LORATADINE 10 MG PO TABS
10.0000 mg | ORAL_TABLET | Freq: Every day | ORAL | Status: DC
  Administered 2015-09-24 – 2015-09-29 (×6): 10 mg via ORAL
  Filled 2015-09-24 (×6): qty 1

## 2015-09-24 MED ORDER — METOPROLOL TARTRATE 12.5 MG HALF TABLET
12.5000 mg | ORAL_TABLET | Freq: Two times a day (BID) | ORAL | Status: DC
  Administered 2015-09-24 – 2015-09-29 (×10): 12.5 mg via ORAL
  Filled 2015-09-24 (×10): qty 1

## 2015-09-24 MED ORDER — BUDESONIDE-FORMOTEROL FUMARATE 80-4.5 MCG/ACT IN AERO
2.0000 | INHALATION_SPRAY | Freq: Two times a day (BID) | RESPIRATORY_TRACT | Status: DC
  Administered 2015-09-25 – 2015-09-29 (×9): 2 via RESPIRATORY_TRACT
  Filled 2015-09-24: qty 6.9

## 2015-09-24 MED ORDER — SODIUM CHLORIDE 0.9 % IV SOLN: INTRAVENOUS | Status: DC

## 2015-09-24 MED ORDER — SODIUM CHLORIDE 0.9 % IV BOLUS (SEPSIS)
500.0000 mL | Freq: Once | INTRAVENOUS | Status: AC
  Administered 2015-09-24: 500 mL via INTRAVENOUS

## 2015-09-24 MED ORDER — SODIUM CHLORIDE 0.9 % IJ SOLN
3.0000 mL | Freq: Two times a day (BID) | INTRAMUSCULAR | Status: DC
  Administered 2015-09-24 – 2015-09-28 (×9): 3 mL via INTRAVENOUS

## 2015-09-24 MED ORDER — HEPARIN SODIUM (PORCINE) 5000 UNIT/ML IJ SOLN
5000.0000 [IU] | Freq: Three times a day (TID) | INTRAMUSCULAR | Status: DC
  Administered 2015-09-24 – 2015-09-29 (×13): 5000 [IU] via SUBCUTANEOUS
  Filled 2015-09-24 (×11): qty 1

## 2015-09-24 MED ORDER — SODIUM CHLORIDE 0.9 % IV SOLN: 250.0000 mL | INTRAVENOUS | Status: DC | PRN

## 2015-09-24 MED ORDER — TIOTROPIUM BROMIDE MONOHYDRATE 18 MCG IN CAPS
18.0000 ug | ORAL_CAPSULE | Freq: Every day | RESPIRATORY_TRACT | Status: DC
  Administered 2015-09-25 – 2015-09-29 (×5): 18 ug via RESPIRATORY_TRACT
  Filled 2015-09-24 (×2): qty 5

## 2015-09-24 MED ORDER — DEXTROSE 10 % IV SOLN
Freq: Once | INTRAVENOUS | Status: AC
  Administered 2015-09-24: 12:00:00 via INTRAVENOUS

## 2015-09-24 MED ORDER — PNEUMOCOCCAL VAC POLYVALENT 25 MCG/0.5ML IJ INJ
0.5000 mL | INJECTION | INTRAMUSCULAR | Status: AC
  Administered 2015-09-25: 0.5 mL via INTRAMUSCULAR
  Filled 2015-09-24: qty 0.5

## 2015-09-24 MED ORDER — MAGNESIUM SULFATE 2 GM/50ML IV SOLN: 2.0000 g | Freq: Once | INTRAVENOUS | Status: DC

## 2015-09-24 MED ORDER — ADULT MULTIVITAMIN W/MINERALS CH
1.0000 | ORAL_TABLET | Freq: Every day | ORAL | Status: DC
  Administered 2015-09-24 – 2015-09-29 (×6): 1 via ORAL
  Filled 2015-09-24 (×6): qty 1

## 2015-09-24 MED ORDER — DEXTROSE 50 % IV SOLN
1.0000 | Freq: Once | INTRAVENOUS | Status: AC
  Administered 2015-09-24: 50 mL via INTRAVENOUS
  Filled 2015-09-24: qty 50

## 2015-09-24 MED ORDER — CLOPIDOGREL BISULFATE 75 MG PO TABS
75.0000 mg | ORAL_TABLET | Freq: Every day | ORAL | Status: DC
  Administered 2015-09-24 – 2015-09-29 (×6): 75 mg via ORAL
  Filled 2015-09-24 (×6): qty 1

## 2015-09-24 MED ORDER — GABAPENTIN 300 MG PO CAPS
300.0000 mg | ORAL_CAPSULE | Freq: Every day | ORAL | Status: DC
  Administered 2015-09-24: 300 mg via ORAL
  Filled 2015-09-24: qty 1

## 2015-09-24 MED ORDER — INSULIN ASPART 100 UNIT/ML ~~LOC~~ SOLN
0.0000 [IU] | Freq: Three times a day (TID) | SUBCUTANEOUS | Status: DC
  Administered 2015-09-24: 1 [IU] via SUBCUTANEOUS
  Administered 2015-09-25: 2 [IU] via SUBCUTANEOUS
  Administered 2015-09-25 – 2015-09-26 (×2): 3 [IU] via SUBCUTANEOUS
  Administered 2015-09-26: 1 [IU] via SUBCUTANEOUS
  Administered 2015-09-26: 2 [IU] via SUBCUTANEOUS
  Administered 2015-09-27 – 2015-09-28 (×4): 3 [IU] via SUBCUTANEOUS
  Administered 2015-09-28: 2 [IU] via SUBCUTANEOUS
  Administered 2015-09-29: 3 [IU] via SUBCUTANEOUS
  Administered 2015-09-29: 1 [IU] via SUBCUTANEOUS

## 2015-09-24 MED ORDER — SODIUM CHLORIDE 0.9 % IJ SOLN: 3.0000 mL | INTRAMUSCULAR | Status: DC | PRN

## 2015-09-24 NOTE — Progress Notes (Signed)
Admission note:   Arrival Method: Via stretcher from ED. Mental Status: A&OX4, sometimes forgetful. Telemetry: Placed on box #19. Verified with Carita PianAllyson Kirkman, RN.  Skin: Intact. Ecchymotic areas on bilat arms. Right lower shins partially reddened.  BLE +3-4 pitting edema. Tubes: N/A. IV: RAC PIV. Pain: Denies.  Family: Daughter at bedside. Living Situation: From home alone. Safety Measures: Bed alarm on middle setting, call bell within reach, instructed to call for assistance. 6E Orientation: Oriented to unit and surroundings.  Leanna BattlesEckelmann, Harmony Sandell Eileen, RN.

## 2015-09-24 NOTE — Consult Note (Signed)
[facility] Renal Consultation Note  Requesting MD: Regalado Indication for Consultation: elevated creatinine   HPI:  Brendan Arroyo is a 81 y.o. male with multiple medical issues to include DM, HTN, CAD, cirrhosis, COPD, severe pulmonary HTN and severe right sided CHF and likely CKD- last known creatinine in our system is 1.13 but that was 16 mos ago.  He is currently seeing Dr. Hinda Lenis- a nephrologist in [place] so I suspect that his more recent baseline kidney function has not been that good. Apparently over the last several weeks the patient has not been doing well.  There is a family member at bedside helping to answer questions.  She notes that about 10 days ago he was found to have a UTI and placed on bactrim- somewhere along the line he was found to have hyponatremia at which time his aldactone and demedex were placed on hold-  I guess his labs were getting worse so he was sent to hospital for eval.  Creatinine 3.21, BUN 68, potassium of 6.1 and sodium of 121.  He took his last bactrim yesterday.  He was on potassium replacement as well.  He is HOH - most of history is obtained from family- he is in no distress.  Volume wise if anything is overloaded- does not seem dry   CREATININE, SER  Date/Time Value Ref Range Status  09/24/2015 10:20 AM 3.21* 0.61 - 1.24 mg/dL Final  05/09/2014 11:03 AM 1.13 0.50 - 1.35 mg/dL Final  05/09/2014 03:53 AM 1.26 0.50 - 1.35 mg/dL Final  05/08/2014 09:20 AM 1.37* 0.50 - 1.35 mg/dL Final  05/07/2014 11:34 PM 1.42* 0.50 - 1.35 mg/dL Final  04/29/2014 04:40 AM 1.76* 0.50 - 1.35 mg/dL Final  04/28/2014 12:15 PM 1.94* 0.50 - 1.35 mg/dL Final  04/28/2014 07:13 AM 2.11* 0.50 - 1.35 mg/dL Final  04/28/2014 03:37 AM 2.46* 0.50 - 1.35 mg/dL Final  04/26/2014 05:00 PM 2.48* 0.50 - 1.35 mg/dL Final  04/26/2014 01:45 PM 2.47* 0.50 - 1.35 mg/dL Final  04/26/2014 12:41 PM 2.80* 0.50 - 1.35 mg/dL Final  07/31/2012 05:25 AM 1.14 0.50 - 1.35 mg/dL Final   07/31/2012 01:44 AM 1.24 0.50 - 1.35 mg/dL Final  05/16/2009 11:31 PM 1.0 0.4 - 1.5 mg/dL Final  05/16/2009 11:15 PM 1.02 0.4 - 1.5 mg/dL Final  10/17/2008 03:50 AM 0.92 0.4 - 1.5 mg/dL Final  10/16/2008 06:40 AM 0.98 0.4 - 1.5 mg/dL Final  10/15/2008 02:43 PM 1.20 0.4 - 1.5 mg/dL Final  08/23/2007 03:32 AM 1.04  Final  08/22/2007 03:25 AM 1.08  Final  08/20/2007 09:08 PM 1.06  Final  08/20/2007 08:29 PM 1.2  Final     PMHx:   Past Medical History  Diagnosis Date  . Dyslipidemia   . HTN (hypertension)   . DM (diabetes mellitus) ([place])   . Carotid artery disease ([place])     a. Duplex 04/2014: suggest upper range 1-39% BICA.  Marland Kitchen CAD (coronary artery disease)     a. s/p very remote angioplasty, CABG 1993. b. H/o DES to dRCA at anastamotic site of VG 2005, DES to OM 2008. c. Low risk nuc 01/2014.  . H/O: gout   . Diverticulitis   . Colon polyps   . Blood transfusion     '93  . GERD (gastroesophageal reflux disease)   . Hyperlipidemia   . Renal insufficiency     a. OP Cr 04/10/14: 1.32.  Marland Kitchen Stricture and stenosis of esophagus 11/06/2011    egd with esoph stricture  dilation 11/2011.  Erskine Emery MD  . Esophageal reflux 11/06/2011  . COPD (chronic obstructive pulmonary disease) ([place])     a. Oxygen use 2l/m nasally usually nightly and during day as needed.    Past Surgical History  Procedure Laterality Date  . Cabg  1993  . Coronary stent placement  July 2005, 2008    taxus stent of the distal right coronary artery   . Elbow surgery      right  . Tonsillectomy    . Back surgery    . Esophagogastroduodenoscopy  07/31/2012    Procedure: ESOPHAGOGASTRODUODENOSCOPY (EGD);  Surgeon: Ladene Artist, MD,FACG;  Location: [facility] ENDOSCOPY;  Service: Endoscopy;  Laterality: N/A;  pt has food impaction, takes plavix, so no plans to dilate.  . Coronary artery bypass graft    . Esophagogastroduodenoscopy N/A 01/07/2014    Dr. Deatra Ina: peptic esophageal stricture s/p dilation  . Balloon dilation N/A  01/07/2014    Procedure: BALLOON DILATION;  Surgeon: Inda Castle, MD;  Location: [facility] ENDOSCOPY;  Service: Endoscopy;  Laterality: N/A;  . Esophagogastroduodenoscopy N/A 03/09/2014    Dr. Deatra Ina: esophageal stricture s/p dilation  . Balloon dilation N/A 03/09/2014    Procedure: BALLOON DILATION;  Surgeon: Inda Castle, MD;  Location: [facility] ENDOSCOPY;  Service: Endoscopy;  Laterality: N/A;  . Yag laser application Right 04/26/3663    Procedure: YAG LASER APPLICATION;  Surgeon: Williams Che, MD;  Location: [facility] ORS;  Service: Ophthalmology;  Laterality: Right;    Family Hx:  Family History  Problem Relation Age of Onset  . Ovarian cancer Mother   . Diabetes Mother   . Diabetes Brother   . Heart disease Maternal Grandfather   . Colon cancer Neg Hx   . Esophageal cancer Neg Hx   . Stomach cancer Neg Hx     Social History:  reports that he quit smoking about 5 years ago. His smoking use included Cigarettes. He started smoking about 71 years ago. He has a 66 pack-year smoking history. He has never used smokeless tobacco. He reports that he drinks about 2.4 oz of alcohol per week. He reports that he does not use illicit drugs.  Allergies: No Known Allergies  Medications: Prior to Admission medications   Medication Sig Start Date End Date Taking? Authorizing Provider  atorvastatin (LIPITOR) 40 MG tablet Take 20 mg by mouth every morning.   Yes Historical Provider, MD  budesonide-formoterol (SYMBICORT) 80-4.5 MCG/ACT inhaler Inhale 2 puffs into the lungs 2 (two) times daily.   Yes Historical Provider, MD  cetirizine (ZYRTEC) 10 MG tablet Take 10 mg by mouth daily.   Yes Historical Provider, MD  clopidogrel (PLAVIX) 75 MG tablet Take 75 mg by mouth daily.   Yes Historical Provider, MD  Cyanocobalamin (VITAMIN B-12 IJ) Inject 1,000 mcg as directed every 30 (thirty) days.   Yes Historical Provider, MD  gabapentin (NEURONTIN) 300 MG capsule Take 300 mg by mouth at bedtime.   Yes Historical  Provider, MD  glipiZIDE (GLUCOTROL XL) 5 MG 24 hr tablet Take 2.5 mg by mouth daily with breakfast.    Yes Historical Provider, MD  guaifenesin (HUMIBID E) 400 MG TABS tablet Take 400 mg by mouth 2 (two) times daily.   Yes Historical Provider, MD  HYDROcodone-acetaminophen (NORCO/VICODIN) 5-325 MG per tablet Take 1 tablet by mouth every 6 (six) hours as needed for moderate pain.   Yes Historical Provider, MD  metolazone (ZAROXOLYN) 2.5 MG tablet TAKE 1 TIME WEEKLY AS NEEDED FOR  SHORTNESS OF BREATH OR SWELLING Patient taking differently: Take 2.5 mg by mouth every 7 (seven) days. TAKE 1 TIME WEEKLY AS NEEDED FOR SHORTNESS OF BREATH OR SWELLING 02/25/15  Yes Arnoldo Lenis, MD  metoprolol tartrate (LOPRESSOR) 25 MG tablet Take 12.5 mg by mouth 2 (two) times daily.    Yes Historical Provider, MD  Misc. Devices MISC Patient needs portable oxygen canister Dx :CHF 12/10/14  Yes Historical Provider, MD  Multiple Vitamins-Minerals ([employer] SPECTRAVITE SENIOR) TABS Take 1 tablet by mouth daily.     Yes Historical Provider, MD  Potassium Chloride ER 20 MEQ TBCR Take 20 mEq by mouth 2 (two) times daily.   Yes Historical Provider, MD  spironolactone (ALDACTONE) 25 MG tablet Take 25 mg by mouth 2 (two) times daily. 02/18/15 02/18/16 Yes Historical Provider, MD  sulfamethoxazole-trimethoprim (BACTRIM DS,SEPTRA DS) 800-160 MG tablet Take 1 tablet by mouth 2 (two) times daily. Patient has only been taking once daily   Yes Historical Provider, MD  tiotropium (SPIRIVA) 18 MCG inhalation capsule Place 18 mcg into inhaler and inhale daily.   Yes Historical Provider, MD  torsemide (DEMADEX) 20 MG tablet Take 20 mg by mouth 3 (three) times daily.   Yes Historical Provider, MD  nitroGLYCERIN (NITROSTAT) 0.4 MG SL tablet Place 0.4 mg under the tongue every 5 (five) minutes as needed for chest pain.    Historical Provider, MD    I have reviewed the patient's current medications.  Labs:  Results for orders placed or performed  during the hospital encounter of 09/24/15 (from the past 48 hour(s))  CBC with Differential     Status: Abnormal   Collection Time: 09/24/15 10:20 AM  Result Value Ref Range   WBC 8.5 4.0 - 10.5 K/uL   RBC 4.10 (L) 4.22 - 5.81 MIL/uL   Hemoglobin 12.1 (L) 13.0 - 17.0 g/dL   HCT 35.1 (L) 39.0 - 52.0 %   MCV 85.6 78.0 - 100.0 fL   MCH 29.5 26.0 - 34.0 pg   MCHC 34.5 30.0 - 36.0 g/dL   RDW 14.4 11.5 - 15.5 %   Platelets 252 150 - 400 K/uL   Neutrophils Relative % 59 %   Neutro Abs 5.1 1.7 - 7.7 K/uL   Lymphocytes Relative 22 %   Lymphs Abs 1.8 0.7 - 4.0 K/uL   Monocytes Relative 12 %   Monocytes Absolute 1.0 0.1 - 1.0 K/uL   Eosinophils Relative 6 %   Eosinophils Absolute 0.5 0.0 - 0.7 K/uL   Basophils Relative 1 %   Basophils Absolute 0.0 0.0 - 0.1 K/uL  Comprehensive metabolic panel     Status: Abnormal   Collection Time: 09/24/15 10:20 AM  Result Value Ref Range   Sodium 121 (L) 135 - 145 mmol/L   Potassium 6.1 (HH) 3.5 - 5.1 mmol/L    Comment: NO VISIBLE HEMOLYSIS CRITICAL RESULT CALLED TO, READ BACK BY AND VERIFIED WITH: S.SLACK,RN 09/24/15 1105 BY BSLADE    Chloride 90 (L) 101 - 111 mmol/L   CO2 23 22 - 32 mmol/L   Glucose, Bld 103 (H) 65 - 99 mg/dL   BUN 68 (H) 6 - 20 mg/dL   Creatinine, Ser 3.21 (H) 0.61 - 1.24 mg/dL   Calcium 8.8 (L) 8.9 - 10.3 mg/dL   Total Protein 6.1 (L) 6.5 - 8.1 g/dL   Albumin 3.1 (L) 3.5 - 5.0 g/dL   AST 31 15 - 41 U/L   ALT 18 17 - 63 U/L   Alkaline  Phosphatase 93 38 - 126 U/L   Total Bilirubin 1.6 (H) 0.3 - 1.2 mg/dL   GFR calc non Af Amer 16 (L) >60 mL/min   GFR calc Af Amer 19 (L) >60 mL/min    Comment: (NOTE) The eGFR has been calculated using the CKD EPI equation. This calculation has not been validated in all clinical situations. eGFR's persistently <60 mL/min signify possible Chronic Kidney Disease.    Anion gap 8 5 - 15  Magnesium     Status: Abnormal   Collection Time: 09/24/15 10:20 AM  Result Value Ref Range   Magnesium  2.7 (H) 1.7 - 2.4 mg/dL  Protime-INR     Status: None   Collection Time: 09/24/15 10:20 AM  Result Value Ref Range   Prothrombin Time 15.0 11.6 - 15.2 seconds   INR 1.17 0.00 - 1.49  Brain natriuretic peptide     Status: Abnormal   Collection Time: 09/24/15 10:20 AM  Result Value Ref Range   B Natriuretic Peptide 1503.5 (H) 0.0 - 100.0 pg/mL  Urinalysis, Routine w reflex microscopic (not at [facility])     Status: Abnormal   Collection Time: 09/24/15 10:40 AM  Result Value Ref Range   Color, Urine YELLOW YELLOW   APPearance CLOUDY (A) CLEAR   Specific Gravity, Urine 1.010 1.005 - 1.030   pH 5.5 5.0 - 8.0   Glucose, UA NEGATIVE NEGATIVE mg/dL   Hgb urine dipstick NEGATIVE NEGATIVE   Bilirubin Urine NEGATIVE NEGATIVE   Ketones, ur NEGATIVE NEGATIVE mg/dL   Protein, ur NEGATIVE NEGATIVE mg/dL   Nitrite NEGATIVE NEGATIVE   Leukocytes, UA NEGATIVE NEGATIVE    Comment: MICROSCOPIC NOT DONE ON URINES WITH NEGATIVE PROTEIN, BLOOD, LEUKOCYTES, NITRITE, OR GLUCOSE <1000 mg/dL.  Protein / creatinine ratio, urine     Status: None   Collection Time: 09/24/15 10:40 AM  Result Value Ref Range   Creatinine, Urine 60.62 mg/dL   Total Protein, Urine 7 mg/dL    Comment: NO NORMAL RANGE ESTABLISHED FOR THIS TEST   Protein Creatinine Ratio 0.12 0.00 - 0.15 mg/mg[Cre]  CBG monitoring, ED     Status: Abnormal   Collection Time: 09/24/15 12:34 PM  Result Value Ref Range   Glucose-Capillary 137 (H) 65 - 99 mg/dL   Comment 1 Notify RN    Comment 2 Procedure Error    Comment 3 Document in Chart      ROS:  A comprehensive review of systems was negative except for: Constitutional: positive for fatigue and malaise Cardiovascular: positive for dyspnea and syncope Musculoskeletal: positive for arthralgias  Physical Exam: Filed Vitals:   09/24/15 1315 09/24/15 1400  BP: 118/70 115/90  Pulse: 60 60  Temp:    Resp: 18 11     General: elderly appearing- HOH- some confusion HEENT: PERRLA, EOMI Neck:  no JVD Heart: bradycardia- on beta vocker Lungs: mostly clear Abdomen: distended, non tender Extremities: pitting edema- but pt and family say is normal for him Skin: warm and dry Neuro: a little confused -? Baseline- apparently drives  Assessment/Plan: 81 year old WM with multiple medical issues including CKD but unknown recent baseline- now presents with presumed worsening of renal function, hyperkalemia and hyponatremia 1.Renal- presumed worsening renal function with hyperkalemia- could this all be due to bactrim ?- family says he started it 10 days ago and took last dose yesterday.  Holding bactrim- - U/A is pretty bland so not suggestive of anything ominous.  Will follow UOP and daily creatinine levels 2. Hypertension/volume  -  difficult to tell.  Has edema so if anything is overloaded.  Due to some syncope and the low sodium and family reports that weight has not increased with no diuretics- the feeling is that he may be intravascularly depleted- giving short course of IVF to see- diuretics currently on hold but would not let him get too overloaded- will follow closely  3. Hyperkalemia- was on bactrim and may have been taking potassium supps- holding both- given medical treatment with follow up K  4. Hyponatremia- do not know what this is due to or what his baseline sodium is- checking a urine osm- attempt some IVF and see 5. Anemia  - not an issue which argues for maybe some hemoconcentration.   Thank you for consult, will follow with you    Naiah Donahoe A 09/24/2015, 3:01 PM

## 2015-09-24 NOTE — ED Provider Notes (Signed)
CSN: [id number]     Arrival date & time 09/24/15  4098 History   First MD Initiated Contact with Patient 09/24/15 1002     Chief Complaint  Patient presents with  . Abnormal Lab     (Consider location/radiation/quality/duration/timing/severity/associated sxs/prior Treatment) HPI   Blood pressure 110/91, pulse 66, temperature 97.5 F (36.4 C), temperature source Oral, resp. rate 20, SpO2 94 %.  Brendan Arroyo is a 81 y.o. male past medical history significant for non-insulin-dependent diabetes, hypertension, CAD, renal insufficiency, cirrhosis and COPD (on 3 L of oxygen time since by PCP Dr. Lysbeth Galas for evaluation of elevated creatinine. Patient is accompanied by his daughter who supplies most of the history: States patient has had 2 falls in the last week, seemed slightly more confused than normal but is oriented 3 and is living independently. Patient was taken off all of his diuretics proximately 7 days ago to try to correct the hyponatremia. He was seen yesterday and restarted on torsemide, his creatinine was noted to be elevated. Patient states that he has no complaints, states that he weighs daily, he was 191 pounds this morning which is not atypical for him. States that he hasn't had any significant recent weight gain: States he gains about one day loses at the next. No increasing peripheral edema, chest pain, nausea, vomiting. As per daughter he is slightly slower to respond than normal but overall very sharp. Endorses head trauma on fall on Tuesday. Patient is taking Plavix. Intermittently has therapeutic peritoneal taps, last one was in October. Slightly increasing abdominal girth with no tension or pain or shortness of breath. States that he is coughing less than normal. Pt states he had positive MRSA in urine noted last week, they prescribed Bactrim which she's been taking it regularly and intermittently.  PCP [place]   Past Medical History  Diagnosis Date  . Dyslipidemia   . HTN  (hypertension)   . DM (diabetes mellitus) (HCC)   . Carotid artery disease (HCC)     a. Duplex 04/2014: suggest upper range 1-39% BICA.  Marland Kitchen CAD (coronary artery disease)     a. s/p very remote angioplasty, CABG 1993. b. H/o DES to dRCA at anastamotic site of VG 2005, DES to OM 2008. c. Low risk nuc 01/2014.  . H/O: gout   . Diverticulitis   . Colon polyps   . Blood transfusion     '93  . GERD (gastroesophageal reflux disease)   . Hyperlipidemia   . Renal insufficiency     a. OP Cr 04/10/14: 1.32.  Marland Kitchen Stricture and stenosis of esophagus 11/06/2011    egd with esoph stricture dilation 11/2011.  [name]  . Esophageal reflux 11/06/2011  . COPD (chronic obstructive pulmonary disease) (HCC)     a. Oxygen use 2l/m nasally usually nightly and during day as needed.   Past Surgical History  Procedure Laterality Date  . Cabg  1993  . Coronary stent placement  July 2005, 2008    taxus stent of the distal right coronary artery   . Elbow surgery      right  . Tonsillectomy    . Back surgery    . Esophagogastroduodenoscopy  07/31/2012    Procedure: ESOPHAGOGASTRODUODENOSCOPY (EGD);  Surgeon: Meryl Dare, MD,FACG;  Location: [facility] ENDOSCOPY;  Service: Endoscopy;  Laterality: N/A;  pt has food impaction, takes plavix, so no plans to dilate.  . Coronary artery bypass graft    . Esophagogastroduodenoscopy N/A 01/07/2014    Dr. Arlyce Dice:  peptic esophageal stricture s/p dilation  . Balloon dilation N/A 01/07/2014    Procedure: BALLOON DILATION;  Surgeon: Louis Meckelobert D Kaplan, MD;  Location: [facility] ENDOSCOPY;  Service: Endoscopy;  Laterality: N/A;  . Esophagogastroduodenoscopy N/A 03/09/2014    Dr. [name]: esophageal stricture s/p dilation  . Balloon dilation N/A 03/09/2014    Procedure: BALLOON DILATION;  Surgeon: Louis Meckelobert D Kaplan, MD;  Location: [facility] ENDOSCOPY;  Service: Endoscopy;  Laterality: N/A;  . Yag laser application Right 11/30/2014    Procedure: YAG LASER APPLICATION;  Surgeon: Susa Simmondsarroll F Haines, MD;   Location: [facility] ORS;  Service: Ophthalmology;  Laterality: Right;   Family History  Problem Relation Age of Onset  . Ovarian cancer Mother   . Diabetes Mother   . Diabetes Brother   . Heart disease Maternal Grandfather   . Colon cancer Neg Hx   . Esophageal cancer Neg Hx   . Stomach cancer Neg Hx    Social History  Substance Use Topics  . Smoking status: Former Smoker -- 1.00 packs/day for 66 years    Types: Cigarettes    Start date: 02/25/1944    Quit date: 10/02/2009  . Smokeless tobacco: Never Used     Comment: Quit 2011?. has a 40 pack-year history   . Alcohol Use: 2.4 oz/week    4 Shots of liquor per week     Comment: social -occ.Has had drink of choice as beer in past, some liquor    Review of Systems  10 systems reviewed and found to be negative, except as noted in the HPI.  Allergies  Review of patient's allergies indicates no known allergies.  Home Medications   Prior to Admission medications   Medication Sig Start Date End Date Taking? Authorizing Provider  atorvastatin (LIPITOR) 40 MG tablet Take 20 mg by mouth every morning.   Yes Historical Provider, MD  budesonide-formoterol (SYMBICORT) 80-4.5 MCG/ACT inhaler Inhale 2 puffs into the lungs 2 (two) times daily.   Yes Historical Provider, MD  cetirizine (ZYRTEC) 10 MG tablet Take 10 mg by mouth daily.   Yes Historical Provider, MD  clopidogrel (PLAVIX) 75 MG tablet Take 75 mg by mouth daily.   Yes Historical Provider, MD  Cyanocobalamin (VITAMIN B-12 IJ) Inject 1,000 mcg as directed every 30 (thirty) days.   Yes Historical Provider, MD  gabapentin (NEURONTIN) 300 MG capsule Take 300 mg by mouth at bedtime.   Yes Historical Provider, MD  glipiZIDE (GLUCOTROL XL) 5 MG 24 hr tablet Take 2.5 mg by mouth daily with breakfast.    Yes Historical Provider, MD  guaifenesin (HUMIBID E) 400 MG TABS tablet Take 400 mg by mouth 2 (two) times daily.   Yes Historical Provider, MD  HYDROcodone-acetaminophen (NORCO/VICODIN) 5-325  MG per tablet Take 1 tablet by mouth every 6 (six) hours as needed for moderate pain.   Yes Historical Provider, MD  metolazone (ZAROXOLYN) 2.5 MG tablet TAKE 1 TIME WEEKLY AS NEEDED FOR SHORTNESS OF BREATH OR SWELLING Patient taking differently: Take 2.5 mg by mouth every 7 (seven) days. TAKE 1 TIME WEEKLY AS NEEDED FOR SHORTNESS OF BREATH OR SWELLING 02/25/15  Yes Antoine PocheJonathan F Branch, MD  metoprolol tartrate (LOPRESSOR) 25 MG tablet Take 12.5 mg by mouth 2 (two) times daily.    Yes Historical Provider, MD  Misc. Devices MISC Patient needs portable oxygen canister Dx :CHF 12/10/14  Yes Historical Provider, MD  Multiple Vitamins-Minerals ([employer] SPECTRAVITE SENIOR) TABS Take 1 tablet by mouth daily.     Yes Historical Provider,  MD  Potassium Chloride ER 20 MEQ TBCR Take 20 mEq by mouth 2 (two) times daily.   Yes Historical Provider, MD  spironolactone (ALDACTONE) 25 MG tablet Take 25 mg by mouth 2 (two) times daily. 02/18/15 02/18/16 Yes Historical Provider, MD  sulfamethoxazole-trimethoprim (BACTRIM DS,SEPTRA DS) 800-160 MG tablet Take 1 tablet by mouth 2 (two) times daily. Patient has only been taking once daily   Yes Historical Provider, MD  tiotropium (SPIRIVA) 18 MCG inhalation capsule Place 18 mcg into inhaler and inhale daily.   Yes Historical Provider, MD  torsemide (DEMADEX) 20 MG tablet Take 20 mg by mouth 3 (three) times daily.   Yes Historical Provider, MD  nitroGLYCERIN (NITROSTAT) 0.4 MG SL tablet Place 0.4 mg under the tongue every 5 (five) minutes as needed for chest pain.    Historical Provider, MD   BP 118/70 mmHg  Pulse 60  Temp(Src) 97.5 F (36.4 C) (Oral)  Resp 18  SpO2 100% Physical Exam  Constitutional: He is oriented to person, place, and time. He appears well-developed and well-nourished. No distress.  HENT:  Head: Normocephalic and atraumatic.  Mouth/Throat: Oropharynx is clear and moist.  Eyes: Conjunctivae and EOM are normal. Pupils are equal, round, and reactive to light.   Neck: Normal range of motion.  Cardiovascular: Normal rate, regular rhythm and intact distal pulses.   Pulmonary/Chest: Effort normal. No respiratory distress. He has wheezes. He has no rales. He exhibits no tenderness.  Abdominal: Soft. Bowel sounds are normal. He exhibits distension. He exhibits no mass. There is no tenderness. There is no rebound and no guarding.  Abdomen distended but soft with positive fluid wave, no tension, no tenderness  Musculoskeletal: Normal range of motion. He exhibits edema.  3+ pitting edema to bilateral proximal shins  Neurological: He is alert and oriented to person, place, and time.  Follows commands, Clear, goal oriented speech, Strength is 5 out of 5x4 extremities. Sensation is grossly intact.   Skin: He is not diaphoretic.  Psychiatric: He has a normal mood and affect.  Nursing note and vitals reviewed.   ED Course  Procedures (including critical care time) Labs Review Labs Reviewed  CBC WITH DIFFERENTIAL/PLATELET - Abnormal; Notable for the following:    RBC 4.10 (*)    Hemoglobin 12.1 (*)    HCT 35.1 (*)    All other components within normal limits  COMPREHENSIVE METABOLIC PANEL - Abnormal; Notable for the following:    Sodium 121 (*)    Potassium 6.1 (*)    Chloride 90 (*)    Glucose, Bld 103 (*)    BUN 68 (*)    Creatinine, Ser 3.21 (*)    Calcium 8.8 (*)    Total Protein 6.1 (*)    Albumin 3.1 (*)    Total Bilirubin 1.6 (*)    GFR calc non Af Amer 16 (*)    GFR calc Af Amer 19 (*)    All other components within normal limits  URINALYSIS, ROUTINE W REFLEX MICROSCOPIC (NOT AT [facility]) - Abnormal; Notable for the following:    APPearance CLOUDY (*)    All other components within normal limits  MAGNESIUM - Abnormal; Notable for the following:    Magnesium 2.7 (*)    All other components within normal limits  CBG MONITORING, ED - Abnormal; Notable for the following:    Glucose-Capillary 137 (*)    All other components within normal  limits  PROTEIN / CREATININE RATIO, URINE  PROTIME-INR  BRAIN NATRIURETIC PEPTIDE  Imaging Review Ct Head Wo Contrast  09/24/2015  CLINICAL DATA:  81 year old male fell backwards several days ago. Head trauma. Initial encounter. EXAM: CT HEAD WITHOUT CONTRAST TECHNIQUE: Contiguous axial images were obtained from the base of the skull through the vertex without intravenous contrast. COMPARISON:  Head CT 08/05/2014. FINDINGS: No scalp hematoma identified. Stable orbits soft tissues. No acute osseous abnormality identified. There are small foci of gas in venous structures at the skullbase (including cavernous sinus) and occasionally visible in the right face. No subcutaneous emphysema. Visualized paranasal sinuses and mastoids are clear. Calcified atherosclerosis at the skull base. Cerebral volume is within normal limits for age. No midline shift, ventriculomegaly, mass effect, evidence of mass lesion, intracranial hemorrhage or evidence of cortically based acute infarction. Gray-white matter differentiation is within normal limits throughout the brain. No suspicious intracranial vascular hyperdensity. IMPRESSION: 1. Normal for age non contrast appearance of the brain. No acute traumatic injury identified. 2. Small volume of intravenous gas at the skullbase likely due to recent IV access. Electronically Signed   By: Odessa Fleming M.D.   On: 09/24/2015 11:57   I have personally reviewed and evaluated these images and lab results as part of my medical decision-making.   EKG Interpretation   Date/Time:  Friday September 24 2015 10:34:18 EST Ventricular Rate:  57 PR Interval:    QRS Duration: 109 QT Interval:  430 QTC Calculation: 419 R Axis:   129 Text Interpretation:  rhythm indeterminate Probable RVH w/ secondary repol  abnormality Nonspecific T abnormalities, lateral leads abnormal t waves  noted in prior Confirmed by Bebe Shaggy  MD, DONALD ([id number]) on 09/24/2015  10:45:20 AM      MDM   Final  diagnoses:  Acute on chronic renal failure (HCC)  Hyperkalemia  Hyponatremia    Filed Vitals:   09/24/15 1230 09/24/15 1245 09/24/15 1300 09/24/15 1315  BP: 104/62 123/59 110/63 118/70  Pulse: 61 59 59 60  Temp:      TempSrc:      Resp: SpO2: 100% 100% 100% 100%    Medications  0.9 %  sodium chloride infusion (not administered)  sodium chloride 0.9 % bolus 500 mL (not administered)  dextrose 10 % infusion ( Intravenous New Bag/Given 09/24/15 1134)  dextrose 50 % solution 50 mL (50 mLs Intravenous Given 09/24/15 1123)  insulin aspart (novoLOG) injection 10 Units (10 Units Intravenous Given 09/24/15 1126)  sodium polystyrene (KAYEXALATE) 15 GM/60ML suspension 30 g (30 g Oral Given 09/24/15 1127)    Brendan Arroyo is 81 y.o. male presentingelevated creatinine, hyponatremia noted last week and several falls with slightly more confusion than normal.  Pt generally alert and oriented, neuro exam nonfocal. File with mild head trauma in the last week and on Plavix. Does not appear acutely encephalopathic. Creatinine elevated today at 3.21. GFR of 19. Hyperkalemic to 6.1 however, there is no EKG changes. Patient will be given Kayexalate insulin/glucose. He is also hyponatremic at 1.21. Normal CBG.   Head CT negative.  Case discussed with Dr. Kathrene Bongo of nephrology, she will consult on the floor.  Unassigned admission, case discussed with NP Rennis Harding w/Dr. Sunnie Nielsen to tele bed.   This is a shared visit with the attending physician who personally evaluated the patient and agrees with the care plan.     Wynetta Emery, PA-C 09/24/15 1353  Zadie Rhine, MD 09/24/15 [phone]

## 2015-09-24 NOTE — Consult Note (Signed)
CARDIOLOGY CONSULT NOTE   Patient ID: DREAM NODAL MRN: [id number] DOB/AGE: 79-Nov-1930 79 y.o.  Admit date: 09/24/2015  Primary Physician   [name], MD Primary Cardiologist   Dr. Wyline Mood Reason for Consultation   Volume management  Referring Physician Russella Dar ANP  HPI: Brendan Arroyo is a 81 y.o. male with a history of hypertension, hyperlipidemia, diabetes, CAD status post CABG, carotid artery disease, severe pulmonary hypertension with right-sided heart failure and chronic diastolic heart failure, syncope, COPD with chronic hypoxia on 3 L oxygen 24/7, OSA on CPAP who presented for evaluation of electrolyte abnormality.  Diuretics previously have been limited due to syncope thought related to orthostatic hypotension July 2015. He requires occasional paracentesis to remove his ascites as well as multiple large-volume paracentesis.  Last echo echo 04/2014 LVEF 60-65%, grade I diastolic dysfunction, severe RV dysfunction, moderate TR with PASP 101.   Hx of CABG in 1993. Last cath Jan 2010 LM patent, LAD occluded, LIMA-LAD patent, LCX patent, OM1 40 ostial, patent AV circumflex stent, OM2 60% and stable, RCA 50-60% mid. Patent SVG-RCA with patent stent at its insertion, LVEF normal. He has been continued on longterm plavix as opposed to ASA, he is not sure why.   He was doing well on cardiac stand point when last seen by Dr. Wyline Mood 08/19/15. At that time he was taking torsemide 20 mg 3 times a day, metolazone as needed weekly, Spironolactone 25 mg twice a day.  Per daughter who provided most of the history stated that patient's oral diuretic was held by his PCP due to hyponatremia about 7-10 days ago. He was recently seems more confused. Yesterday again he is sore by his PCP and torsemide resumed due to elevated creatinine. His average weight is around 191 LB. The patient fall last Tuesday 09/20/12. He was standing and felt dizzy and slightly against the wall. No loss  of consciousness. He uses 2 pillows every night. Shortness of breath stable. Denies chest pain, palpitations, orthopnea or PND. Patient has a chronic lower extremity edema as well. Due to elevated creatinine patient advised to come to the ED for further evaluation.  In ED patient has a severe electrolyte abnormality, sodium 121, potassium 6.1, chloride 90, BUN 68, creatinine 3.21, GFR 19, magnesium 2.7, BNP 1503.5, hemoglobin 12.1. Urinalysis normal. CT scan of head without acute abnormality. Chest x-ray with cardiomegaly. EKG sinus rhythm with nonspecific T-wave abnormality. Patient given Kayexalate and started on IV fluid. Cardiology is consulted for further management. Nephrology consult pending.  Past Medical History  Diagnosis Date  . Dyslipidemia   . HTN (hypertension)   . DM (diabetes mellitus) (HCC)   . Carotid artery disease (HCC)     a. Duplex 04/2014: suggest upper range 1-39% BICA.  Marland Kitchen CAD (coronary artery disease)     a. s/p very remote angioplasty, CABG 1993. b. H/o DES to dRCA at anastamotic site of VG 2005, DES to OM 2008. c. Low risk nuc 01/2014.  . H/O: gout   . Diverticulitis   . Colon polyps   . Blood transfusion     '93  . GERD (gastroesophageal reflux disease)   . Hyperlipidemia   . Renal insufficiency     a. OP Cr 04/10/14: 1.32.  Marland Kitchen Stricture and stenosis of esophagus 11/06/2011    egd with esoph stricture dilation 11/2011.  [name]  . Esophageal reflux 11/06/2011  . COPD (chronic obstructive pulmonary disease) (HCC)     a. Oxygen use 2l/m nasally  usually nightly and during day as needed.     Past Surgical History  Procedure Laterality Date  . Cabg  1993  . Coronary stent placement  July 2005, 2008    taxus stent of the distal right coronary artery   . Elbow surgery      right  . Tonsillectomy    . Back surgery    . Esophagogastroduodenoscopy  07/31/2012    Procedure: ESOPHAGOGASTRODUODENOSCOPY (EGD);  Surgeon: Meryl Dare, MD,FACG;  Location: [facility]  ENDOSCOPY;  Service: Endoscopy;  Laterality: N/A;  pt has food impaction, takes plavix, so no plans to dilate.  . Coronary artery bypass graft    . Esophagogastroduodenoscopy N/A 01/07/2014    Dr. Arlyce Dice: peptic esophageal stricture s/p dilation  . Balloon dilation N/A 01/07/2014    Procedure: BALLOON DILATION;  Surgeon: Louis Meckel, MD;  Location: [facility] ENDOSCOPY;  Service: Endoscopy;  Laterality: N/A;  . Esophagogastroduodenoscopy N/A 03/09/2014    Dr. Arlyce Dice: esophageal stricture s/p dilation  . Balloon dilation N/A 03/09/2014    Procedure: BALLOON DILATION;  Surgeon: Louis Meckel, MD;  Location: [facility] ENDOSCOPY;  Service: Endoscopy;  Laterality: N/A;  . Yag laser application Right 11/30/2014    Procedure: YAG LASER APPLICATION;  Surgeon: Susa Simmonds, MD;  Location: [facility] ORS;  Service: Ophthalmology;  Laterality: Right;    No Known Allergies  I have reviewed the patient's current medications   . sodium chloride    . sodium chloride       Prior to Admission medications   Medication Sig Start Date End Date Taking? Authorizing Provider  atorvastatin (LIPITOR) 40 MG tablet Take 20 mg by mouth every morning.   Yes Historical Provider, MD  budesonide-formoterol (SYMBICORT) 80-4.5 MCG/ACT inhaler Inhale 2 puffs into the lungs 2 (two) times daily.   Yes Historical Provider, MD  cetirizine (ZYRTEC) 10 MG tablet Take 10 mg by mouth daily.   Yes Historical Provider, MD  clopidogrel (PLAVIX) 75 MG tablet Take 75 mg by mouth daily.   Yes Historical Provider, MD  Cyanocobalamin (VITAMIN B-12 IJ) Inject 1,000 mcg as directed every 30 (thirty) days.   Yes Historical Provider, MD  gabapentin (NEURONTIN) 300 MG capsule Take 300 mg by mouth at bedtime.   Yes Historical Provider, MD  glipiZIDE (GLUCOTROL XL) 5 MG 24 hr tablet Take 2.5 mg by mouth daily with breakfast.    Yes Historical Provider, MD  guaifenesin (HUMIBID E) 400 MG TABS tablet Take 400 mg by mouth 2 (two) times daily.   Yes Historical  Provider, MD  HYDROcodone-acetaminophen (NORCO/VICODIN) 5-325 MG per tablet Take 1 tablet by mouth every 6 (six) hours as needed for moderate pain.   Yes Historical Provider, MD  metolazone (ZAROXOLYN) 2.5 MG tablet TAKE 1 TIME WEEKLY AS NEEDED FOR SHORTNESS OF BREATH OR SWELLING Patient taking differently: Take 2.5 mg by mouth every 7 (seven) days. TAKE 1 TIME WEEKLY AS NEEDED FOR SHORTNESS OF BREATH OR SWELLING 02/25/15  Yes Antoine Poche, MD  metoprolol tartrate (LOPRESSOR) 25 MG tablet Take 12.5 mg by mouth 2 (two) times daily.    Yes Historical Provider, MD  Misc. Devices MISC Patient needs portable oxygen canister Dx :CHF 12/10/14  Yes Historical Provider, MD  Multiple Vitamins-Minerals ([employer] SPECTRAVITE SENIOR) TABS Take 1 tablet by mouth daily.     Yes Historical Provider, MD  Potassium Chloride ER 20 MEQ TBCR Take 20 mEq by mouth 2 (two) times daily.   Yes Historical Provider, MD  spironolactone (ALDACTONE)  25 MG tablet Take 25 mg by mouth 2 (two) times daily. 02/18/15 02/18/16 Yes Historical Provider, MD  sulfamethoxazole-trimethoprim (BACTRIM DS,SEPTRA DS) 800-160 MG tablet Take 1 tablet by mouth 2 (two) times daily. Patient has only been taking once daily   Yes Historical Provider, MD  tiotropium (SPIRIVA) 18 MCG inhalation capsule Place 18 mcg into inhaler and inhale daily.   Yes Historical Provider, MD  torsemide (DEMADEX) 20 MG tablet Take 20 mg by mouth 3 (three) times daily.   Yes Historical Provider, MD  nitroGLYCERIN (NITROSTAT) 0.4 MG SL tablet Place 0.4 mg under the tongue every 5 (five) minutes as needed for chest pain.    Historical Provider, MD     Social History   Social History  . Marital Status: Widowed    Spouse Name: N/A  . Number of Children: 1  . Years of Education: N/A   Occupational History  . retired    Social History Main Topics  . Smoking status: Former Smoker -- 1.00 packs/day for 66 years    Types: Cigarettes    Start date: 02/25/1944    Quit date:  10/02/2009  . Smokeless tobacco: Never Used     Comment: Quit 2011?. has a 40 pack-year history   . Alcohol Use: 2.4 oz/week    4 Shots of liquor per week     Comment: social -occ.Has had drink of choice as beer in past, some liquor  . Drug Use: No  . Sexual Activity: Not on file   Other Topics Concern  . Not on file   Social History Narrative   [place] and is [place]. Tries to eat a heart health diet and exercises regularly.     No family status information on file.   Family History  Problem Relation Age of Onset  . Ovarian cancer Mother   . Diabetes Mother   . Diabetes Brother   . Heart disease Maternal Grandfather   . Colon cancer Neg Hx   . Esophageal cancer Neg Hx   . Stomach cancer Neg Hx        ROS:  Full 14 point review of systems complete and found to be negative unless listed above.  Physical Exam: Blood pressure 118/70, pulse 60, temperature 97.5 F (36.4 C), temperature source Oral, resp. rate 18, SpO2 100 %.  General: Well developed, well nourished, male in no acute distress Head: Eyes PERRLA, No xanthomas. Normocephalic and atraumatic, oropharynx without edema or exudate.  Lungs: Resp regular and unlabored. Faint bibasilar crackles.  Heart: RRR no s3, s4 with 3/6 systolic  murmurs Neck: No carotid bruits. No lymphadenopathy. No  JVD. Abdomen: Bowel sounds present, abdomen soft mildly distended Msk:  No spine or cva tenderness. No weakness, no joint deformities or effusions. Extremities: No clubbing, cyanosis. DP 1+  and equal bilaterally. 1+ BL LE edema with erythema, no warmth.  Neuro: Alert and oriented X 3. No focal deficits noted. Psych:  Good affect, responds appropriately Skin: No rashes or lesions noted.  Labs:   Lab Results  Component Value Date   WBC 8.5 09/24/2015   HGB 12.1* 09/24/2015   HCT 35.1* 09/24/2015   MCV 85.6 09/24/2015   PLT 252 09/24/2015    Recent Labs  09/24/15 1020  INR 1.17    Recent Labs Lab  09/24/15 1020  NA 121*  K 6.1*  CL 90*  CO2 23  BUN 68*  CREATININE 3.21*  CALCIUM 8.8*  PROT 6.1*  BILITOT 1.6*  ALKPHOS  93  ALT 18  AST 31  GLUCOSE 103*  ALBUMIN 3.1*   MAGNESIUM  Date Value Ref Range Status  09/24/2015 2.7* 1.7 - 2.4 mg/dL Final   Echo: 1/61/0960 LV EF: 60% -  65%  ------------------------------------------------------------------- Indications:   Syncope 780.2.  ------------------------------------------------------------------- History:  Risk factors: Hypertension. Diabetes mellitus. Dyslipidemia.  ------------------------------------------------------------------- Study Conclusions  - Left ventricle: The cavity size was normal. There was mild focal basal hypertrophy of the septum. Systolic function was normal. The estimated ejection fraction was in the range of 60% to 65%. Wall motion was normal; there were no regional wall motion abnormalities. There was an increased relative contribution of atrial contraction to ventricular filling. Doppler parameters are consistent with abnormal left ventricular relaxation (grade 1 diastolic dysfunction). - Mitral valve: Calcified annulus. There was trivial regurgitation. - Left atrium: The atrium was mildly dilated. - Right ventricle: The cavity size was moderately dilated. Wall thickness was normal. Systolic function was severely reduced. - Right atrium: The atrium was severely dilated. - Tricuspid valve: There was moderate regurgitation. - Pulmonic valve: There was trivial regurgitation. - Pulmonary arteries: PA peak pressure: 101 mm Hg (S).  Impressions:  - The right ventricular systolic pressure was increased consistent with severe pulmonary hypertension.   Cath Jan 2010 HEMODYNAMIC DATA:  1. The central aortic pressure is 152/61, mean 93.  2. Left ventricular pressure 164/5.  3. There was no gradient on pullback across aortic valve.  ANGIOGRAPHIC DATA:  1. The  left main is free of critical disease.  2. The left anterior descending artery is totally occluded after a  small diagonal.  3. The internal mammary to the diagonal and distal LAD appears to be  intact, with good runoff into both branches. There is disease  between the 2 branches.  4. The native circumflex is calcified. There is a first marginal that  has about 40% ostial narrowing, but is fairly small in caliber.  The AV circumflex after the takeoff of the second marginal is  previously stented and there is no evidence of any significant  restenosis at this site. The second marginal itself has an  eccentric plaque of about 60% noted in the midportion of that  artery. It has been previously visualized, and compared to  previous study, I do not observe a substantial change. Distally,  the AV circumflex has about 50% eccentric bend leading into the  fairly large third marginal branch.  5. The right coronary artery has a 50-60% mid stenosis. The vessel is  then diffusely diseased distally, and there is competitive filling.  6. The vein graft at its inserts into the distal right covering the PD  and PLA system appears to be widely patent. The stent itself is  patent as well. There is maybe 40% in the continuation branch.  7. The ventriculogram by hand only suggests preserved LV function.  CONCLUSION:  1. Preserved overall left ventricular function.  2. Continued patency of the internal mammary to the diagonal LAD.  3. Continued patency of the saphenous vein graft to the distal right  with continued patency of the stent at its insertion.  4. Continued patency of the stent to the mid circumflex.  5. Moderate lesions of the OM-2 and distal AV circumflex as described  above.  DISPOSITION: I have carefully reviewed the old films and compared them  to the current studies. The circumflex OM lesions do not appear to be  substantially progressed. It potentially  could be the source of  ischemia, but does not appear to be unstable as such, given the current  anatomy I am inclined to treat him medically at the present time. We  will see how he does first and I will see him back in followup in the  office soon.   04/2014 Carotid US Summary: Findings suggest upper range 1-39% internal carotid artery stenosis bilaterally. Vertebral arteries are patent with antegrade flow.  Other specific details can be found in the table(s) above. Prepared and Electronically Authenticated by   01/2014 MPI Impression  Exercise Capacity: Lexiscan with no exercise.  BP Response: Normal blood pressure response.  Clinical Symptoms: No chest pain.  ECG Impression: No change from baseline pattern of widespread T wave inversion.  Comparison with Prior Nuclear Study: No images to compare  Overall Impression: Low risk stress nuclear study. No evidence of ischemia or scar. No new EKG changes with stress. Normal LV systolic function.  LV Ejection Fraction: 70%. LV Wall Motion: Normal Wall Motion   ECG:  Vent. rate 57 BPM PR interval * ms QRS duration 109 ms QT/QTc 430/419 ms P-R-T axes -1 129 -82  Radiology:  Ct Head Wo Contrast  09/24/2015  CLINICAL DATA:  81 year old male fell backwards several days ago. Head trauma. Initial encounter. EXAM: CT HEAD WITHOUT CONTRAST TECHNIQUE: Contiguous axial images were obtained from the base of the skull through the vertex without intravenous contrast. COMPARISON:  Head CT 08/05/2014. FINDINGS: No scalp hematoma identified. Stable orbits soft tissues. No acute osseous abnormality identified. There are small foci of gas in venous structures at the skullbase (including cavernous sinus) and occasionally visible in the right face. No subcutaneous emphysema. Visualized paranasal sinuses and mastoids are clear. Calcified atherosclerosis at the skull base. Cerebral volume is within normal limits for age. No midline shift,  ventriculomegaly, mass effect, evidence of mass lesion, intracranial hemorrhage or evidence of cortically based acute infarction. Gray-white matter differentiation is within normal limits throughout the brain. No suspicious intracranial vascular hyperdensity. IMPRESSION: 1. Normal for age non contrast appearance of the brain. No acute traumatic injury identified. 2. Small volume of intravenous gas at the skullbase likely due to recent IV access. Electronically Signed   By: Odessa Fleming M.D.   On: 09/24/2015 11:57    ASSESSMENT AND PLAN:     1. Chronic diastolic heart failure/Chronic right ventricular systolic dysfunction/Severe pulm HTN - echo 04/2014 LVEF 60-65%, grade I diastolic dysfunction, severe RV dysfunction, moderate TR with PASP 101 - Patient was off his diuretic for the past 7-10 days, however restarted on torsemide yesterday his PCP.  - He does have a mild volume overloaded on exam. Follow closely while on IV hydration due to multiple electrolyte abnormality. BNP of 1503.5 - Repeat echocardiogram to evaluate left and right ventricular function.  2. CAD s/p CABG - last cath Jan 2010 LM patent, LAD occluded, LIMA-LAD patent, LCX patent, OM1 40 ostial, patent AV circumflex stent, OM2 60% and stable, RCA 50-60% mid. Patent SVG-RCA with patent stent at its insertion, LVEF normal.  - Lexiscan 01/2014 low risk, no evidence of ischemia or scar, LVEF 70% - No anginal pain. He has been taking Plavix, not on aspirin, unknown reason.  3. Acute on chronic kidney disease - With multiple electrolyte abnormality and recent enough diuretic. Pending nephrology consult. - Urine without proteinuria.  4. Peripheral electrolyte abnormalities including hyperkalemia, hyponatremia, elevated creatinine with BUN - Given Kayexalate. Now on gentle IV hydration.  5. Fall - likely due to electrolyte abnormality. No loss  of consciousness this time.  previously syncope thought related to orthostatic hypotension July  2015.  6. HL - Continue statin  7. OSA - On CPAP   8. DM 9. Hepatic cirrhosis 10. Chronic respiratory failure with hypoxia and COPD - on 3 L oxygen 24/7 at home   Signed: [place], [place] 09/24/2015, 2:08 PM Pager [phone]  Co-Sign MD Patient seen and examined and history reviewed. Agree with above findings and plan. 81 yo WM with history of Chronic diastolic and right heart failure with severe pulmonary HTN. Maintained on torsemide and aldactone with prn metolazone. Last paracentesis in October. Reports stable weight, abdominal girth, and edema. Over last 2 weeks has declined with increased confusion, lethargy, and one episode of near syncope. Labs checked 10 days ago by Dr. Lysbeth Galas. Diuretics held. Repeat labs drawn yesterday showed worsening renal function, hyperkalemia, and hyponatremia. He was directed to come to ED. No recent N/V, diarrhea, or decreased fluid intake.  On exam he has mild JVD, lungs with scant crackles. Gr 2-3/6 systolic murmur. Abdomen is protuberant but soft. 1+ edema. Ecg shows NSR with diffuse TWA. Increased TW inversion inferiorly compared to prior. BUN and creatinine elevated significantly from baseline. Potassium 6.1 and sodium 122.    The patient appears to be intravascularly depleted with ARF and electrolyte abnormalities. Agree with hydration with NS. Kayexalate given. Diuretics will be held. Continue other cardiac meds. Will repeat Echo to compare with July 2015. Renal consult in progress.   Marijane Trower [place], MDFACC 09/24/2015 3:18 PM

## 2015-09-24 NOTE — H&P (Signed)
[facility] History and Physical                                                                                    Brendan Arroyo, is a 81 y.o. male  MRN: [id number]   DOB - 1928-12-15  Admit Date - 09/24/2015  Outpatient Primary MD for the patient is Brendan Hector, MD  Referring MD: Bebe Shaggy / ER  Consulting M.D: Kathrene Bongo / Jeralyn Ruths; [name] / Cardiology  PMH: Past Medical History  Diagnosis Date  . Dyslipidemia   . HTN (hypertension)   . DM (diabetes mellitus) (HCC)   . Carotid artery disease (HCC)     a. Duplex 04/2014: suggest upper range 1-39% BICA.  Marland Kitchen CAD (coronary artery disease)     a. s/p very remote angioplasty, CABG 1993. b. H/o DES to dRCA at anastamotic site of VG 2005, DES to OM 2008. c. Low risk nuc 01/2014.  . H/O: gout   . Diverticulitis   . Colon polyps   . Blood transfusion     '93  . GERD (gastroesophageal reflux disease)   . Hyperlipidemia   . Renal insufficiency     a. OP Cr 04/10/14: 1.32.  Marland Kitchen Stricture and stenosis of esophagus 11/06/2011    egd with esoph stricture dilation 11/2011.  Brendan Heaps MD  . Esophageal reflux 11/06/2011  . COPD (chronic obstructive pulmonary disease) (HCC)     a. Oxygen use 2l/m nasally usually nightly and during day as needed.      PSH: Past Surgical History  Procedure Laterality Date  . Cabg  1993  . Coronary stent placement  July 2005, 2008    taxus stent of the distal right coronary artery   . Elbow surgery      right  . Tonsillectomy    . Back surgery    . Esophagogastroduodenoscopy  07/31/2012    Procedure: ESOPHAGOGASTRODUODENOSCOPY (EGD);  Surgeon: Meryl Dare, MD,FACG;  Location: [facility] ENDOSCOPY;  Service: Endoscopy;  Laterality: N/A;  pt has food impaction, takes plavix, so no plans to dilate.  . Coronary artery bypass graft    . Esophagogastroduodenoscopy N/A 01/07/2014    Dr. Arlyce Dice: peptic esophageal stricture s/p dilation  . Balloon dilation N/A 01/07/2014    Procedure: BALLOON DILATION;   Surgeon: Louis Meckel, MD;  Location: [facility] ENDOSCOPY;  Service: Endoscopy;  Laterality: N/A;  . Esophagogastroduodenoscopy N/A 03/09/2014    Dr. Arlyce Dice: esophageal stricture s/p dilation  . Balloon dilation N/A 03/09/2014    Procedure: BALLOON DILATION;  Surgeon: Louis Meckel, MD;  Location: [facility] ENDOSCOPY;  Service: Endoscopy;  Laterality: N/A;  . Yag laser application Right 11/30/2014    Procedure: YAG LASER APPLICATION;  Surgeon: Susa Simmonds, MD;  Location: [facility] ORS;  Service: Ophthalmology;  Laterality: Right;     CC:  Chief Complaint  Patient presents with  . Abnormal Lab     HPI: 81 year old male patient with history of diabetes on 08 days, hypertension, CAD, chronic kidney disease stage III, cirrhosis related to fatty liver disease and remote alcohol use, COPD on chronic 3 L of oxygen, chronic diastolic heart failure in setting of severe pulmonary hypertension and  severe RV systolic dysfunction. Patient has had at least one week of progressive weakness and fatigue. He followed up with his primary care doctor and routine laboratory data one week ago revealed hyponatremia so his Demadex and spironolactone were placed on hold. He was reevaluated on 12/22 and labs were obtained and plans were to resume diuretics if blood work had improved. Laboratory data resulted today and showed worsening renal function with hypokalemia so patient was sent to the ER for further evaluation. Of note patient had a mechanical fall 2 weeks ago without loss of consciousness and no apparent presyncopal episode. Patient has chronic and slightly worsened bilateral lower extremity edema noting patient does sit a majority of the time with legs dependent and forgets to elevate them. He has not had any cough, shortness of breath or orthopnea. No fevers or chills abdominal pain or diarrhea.  ER Evaluation and treatment: CT head without acute injury EKG with very low voltage P waves but the appearance of sinus rhythm with  bradycardic rate ventricular rate 57 bpm, QTC 419 ms, incomplete right bundle branch block with appearance of right ventricular hypertrophy and no ischemic changes Na 121, K+ 6.1, BUN 68 and creatinine 3.21 with baseline 31 and 1.13 BNP 1503 with no previous baseline for comparison Hgb 12.1 Urinalysis cloudy without proteinuria and specific gravity 1.010  Protein creatinine ratio normal at 0.12 10% dextrose 50 mL 1 Regular insulin 10 units 1 Kayexalate 30 g 1  Review of Systems   In addition to the HPI above,  No Fever-chills, myalgias  No Headache, changes with Vision or hearing, new weakness, tingling, numbness in any extremity, No problems swallowing food or Liquids, indigestion/reflux No Chest pain, Cough or Shortness of Breath, palpitations, orthopnea or DOE No Abdominal pain, N/V; no melena or hematochezia, no dark tarry stools, Bowel movements are regular, No dysuria, hematuria or flank pain No new skin rashes, lesions, masses or bruises, No new joints pains-aches No polyuria, polydypsia or polyphagia,  *A full 10 point Review of Systems was done, except as stated above, all other Review of Systems were negative.  Social History Social History  Substance Use Topics  . Smoking status: Former Smoker -- 1.00 packs/day for 66 years    Types: Cigarettes    Start date: 02/25/1944    Quit date: 10/02/2009  . Smokeless tobacco: Never Used     Comment: Quit 2011?. has a 40 pack-year history   . Alcohol Use: 2.4 oz/week    4 Shots of liquor per week     Comment: social -occ.Has had drink of choice as beer in past, some liquor    Resides at: Private residence  Lives with: Alone  Ambulatory status: Rolling walker or cane   Family History Family History  Problem Relation Age of Onset  . Ovarian cancer Mother   . Diabetes Mother   . Diabetes Brother   . Heart disease Maternal Grandfather   . Colon cancer Neg Hx   . Esophageal cancer Neg Hx   . Stomach cancer Neg Hx        Prior to Admission medications   Medication Sig Start Date End Date Taking? Authorizing Provider  atorvastatin (LIPITOR) 40 MG tablet Take 20 mg by mouth every morning.   Yes Historical Provider, MD  budesonide-formoterol (SYMBICORT) 80-4.5 MCG/ACT inhaler Inhale 2 puffs into the lungs 2 (two) times daily.   Yes Historical Provider, MD  cetirizine (ZYRTEC) 10 MG tablet Take 10 mg by mouth daily.   Yes  Historical Provider, MD  clopidogrel (PLAVIX) 75 MG tablet Take 75 mg by mouth daily.   Yes Historical Provider, MD  Cyanocobalamin (VITAMIN B-12 IJ) Inject 1,000 mcg as directed every 30 (thirty) days.   Yes Historical Provider, MD  gabapentin (NEURONTIN) 300 MG capsule Take 300 mg by mouth at bedtime.   Yes Historical Provider, MD  glipiZIDE (GLUCOTROL XL) 5 MG 24 hr tablet Take 2.5 mg by mouth daily with breakfast.    Yes Historical Provider, MD  guaifenesin (HUMIBID E) 400 MG TABS tablet Take 400 mg by mouth 2 (two) times daily.   Yes Historical Provider, MD  HYDROcodone-acetaminophen (NORCO/VICODIN) 5-325 MG per tablet Take 1 tablet by mouth every 6 (six) hours as needed for moderate pain.   Yes Historical Provider, MD  metolazone (ZAROXOLYN) 2.5 MG tablet TAKE 1 TIME WEEKLY AS NEEDED FOR SHORTNESS OF BREATH OR SWELLING Patient taking differently: Take 2.5 mg by mouth every 7 (seven) days. TAKE 1 TIME WEEKLY AS NEEDED FOR SHORTNESS OF BREATH OR SWELLING 02/25/15  Yes Antoine Poche, MD  metoprolol tartrate (LOPRESSOR) 25 MG tablet Take 12.5 mg by mouth 2 (two) times daily.    Yes Historical Provider, MD  Misc. Devices MISC Patient needs portable oxygen canister Dx :CHF 12/10/14  Yes Historical Provider, MD  Multiple Vitamins-Minerals ([employer] SPECTRAVITE SENIOR) TABS Take 1 tablet by mouth daily.     Yes Historical Provider, MD  Potassium Chloride ER 20 MEQ TBCR Take 20 mEq by mouth 2 (two) times daily.   Yes Historical Provider, MD  spironolactone (ALDACTONE) 25 MG tablet Take 25 mg by mouth  2 (two) times daily. 02/18/15 02/18/16 Yes Historical Provider, MD  sulfamethoxazole-trimethoprim (BACTRIM DS,SEPTRA DS) 800-160 MG tablet Take 1 tablet by mouth 2 (two) times daily. Patient has only been taking once daily   Yes Historical Provider, MD  tiotropium (SPIRIVA) 18 MCG inhalation capsule Place 18 mcg into inhaler and inhale daily.   Yes Historical Provider, MD  torsemide (DEMADEX) 20 MG tablet Take 20 mg by mouth 3 (three) times daily.   Yes Historical Provider, MD  nitroGLYCERIN (NITROSTAT) 0.4 MG SL tablet Place 0.4 mg under the tongue every 5 (five) minutes as needed for chest pain.    Historical Provider, MD    No Known Allergies  Physical Exam  Vitals  Blood pressure 118/70, pulse 60, temperature 97.5 F (36.4 C), temperature source Oral, resp. rate 18, SpO2 100 %.   General:  In no acute distress, appears chronically ill but otherwise healthy and well nourished  Psych:  Normal affect, Denies Suicidal or Homicidal ideations, Awake Alert, Oriented X 3. Speech and thought patterns are clear and appropriate  Neuro:   No focal neurological deficits, CN II through XII intact, Strength 5/5 all 4 extremities, Sensation intact all 4 extremities.  ENT:  Ears and Eyes appear Normal, Conjunctivae clear, PER. Moist oral mucosa without erythema or exudates.  Neck:  Supple, No lymphadenopathy appreciated  Respiratory:  Symmetrical chest wall movement, Good air movement bilaterally, a few fine expiratory crackles. 3 L oxygen  Cardiac:  RRR, No Murmurs, 3+ soft bilateral LE edema noted, no JVD, No carotid bruits, peripheral pulses palpable at 2+  Abdomen:  Positive bowel sounds, Soft, Non tender, Non distended,  No masses appreciated, no obvious hepatosplenomegaly  Skin:  No Cyanosis, Normal Skin Turgor, No Skin Rash or Bruise. Bilateral lower extremity stasis dermatitis skin changes  Extremities: Symmetrical without obvious trauma or injury,  no effusions.  Data  Review  CBC  Recent Labs Lab 09/24/15 1020  WBC 8.5  HGB 12.1*  HCT 35.1*  PLT 252  MCV 85.6  MCH 29.5  MCHC 34.5  RDW 14.4  LYMPHSABS 1.8  MONOABS 1.0  EOSABS 0.5  BASOSABS 0.0    Chemistries   Recent Labs Lab 09/24/15 1020  NA 121*  K 6.1*  CL 90*  CO2 23  GLUCOSE 103*  BUN 68*  CREATININE 3.21*  CALCIUM 8.8*  MG 2.7*  AST 31  ALT 18  ALKPHOS 93  BILITOT 1.6*    CrCl cannot be calculated (Unknown ideal weight.).  No results for input(s): TSH, T4TOTAL, T3FREE, THYROIDAB in the last 72 hours.  Invalid input(s): FREET3  Coagulation profile  Recent Labs Lab 09/24/15 1020  INR 1.17    No results for input(s): DDIMER in the last 72 hours.  Cardiac Enzymes No results for input(s): CKMB, TROPONINI, MYOGLOBIN in the last 168 hours.  Invalid input(s): CK  Invalid input(s): POCBNP  Urinalysis    Component Value Date/Time   COLORURINE YELLOW 09/24/2015 1040   APPEARANCEUR CLOUDY* 09/24/2015 1040   LABSPEC 1.010 09/24/2015 1040   PHURINE 5.5 09/24/2015 1040   GLUCOSEU NEGATIVE 09/24/2015 1040   HGBUR NEGATIVE 09/24/2015 1040   BILIRUBINUR NEGATIVE 09/24/2015 1040   KETONESUR NEGATIVE 09/24/2015 1040   PROTEINUR NEGATIVE 09/24/2015 1040   UROBILINOGEN 0.2 05/08/2014 0223   NITRITE NEGATIVE 09/24/2015 1040   LEUKOCYTESUR NEGATIVE 09/24/2015 1040    Imaging results:   Dg Chest 2 View  09/24/2015  CLINICAL DATA:  Fluid overload EXAM: CHEST  2 VIEW COMPARISON:  09/05/2014 FINDINGS: Cardiomediastinal silhouette is stable. Status post median sternotomy. Mild hyperinflation again noted. No acute infiltrate or pulmonary edema. Stable old right rib fracture. Osteopenia and mild degenerative changes thoracic spine. Atherosclerotic calcifications of thoracic aorta. IMPRESSION: No active disease. Cardiomegaly again noted. Status post median sternotomy. Atherosclerotic calcifications of thoracic aorta. Electronically Signed   By: Natasha Mead M.D.   On:  09/24/2015 14:09   Ct Head Wo Contrast  09/24/2015  CLINICAL DATA:  81 year old male fell backwards several days ago. Head trauma. Initial encounter. EXAM: CT HEAD WITHOUT CONTRAST TECHNIQUE: Contiguous axial images were obtained from the base of the skull through the vertex without intravenous contrast. COMPARISON:  Head CT 08/05/2014. FINDINGS: No scalp hematoma identified. Stable orbits soft tissues. No acute osseous abnormality identified. There are small foci of gas in venous structures at the skullbase (including cavernous sinus) and occasionally visible in the right face. No subcutaneous emphysema. Visualized paranasal sinuses and mastoids are clear. Calcified atherosclerosis at the skull base. Cerebral volume is within normal limits for age. No midline shift, ventriculomegaly, mass effect, evidence of mass lesion, intracranial hemorrhage or evidence of cortically based acute infarction. Gray-white matter differentiation is within normal limits throughout the brain. No suspicious intracranial vascular hyperdensity. IMPRESSION: 1. Normal for age non contrast appearance of the brain. No acute traumatic injury identified. 2. Small volume of intravenous gas at the skullbase likely due to recent IV access. Electronically Signed   By: Odessa Fleming M.D.   On: 09/24/2015 11:57     EKG: (Independently reviewed)  very low voltage P waves but the appearance of sinus rhythm with bradycardic rate ventricular rate 57 bpm, QTC 419 ms, incomplete right bundle branch block with appearance of right ventricular hypertrophy and no ischemic changes   Assessment & Plan  Principal Problem:   Acute renal failure on CKD stage 3, GFR 30-59 ml/min/ Hyperkalemia -  Telemetry inpatient -Nephrology consulted by EDP -UA without proteinuria so doubt acute nephrotic syndrome -Suspect mediated by recent diuretics especially worsened by potassium sparing spironolactone -Patient appears to have been on Septra recently as well -Hold  diuretics and gentle IV fluid hydration 12 hours with a one-time 500 mL normal saline bolus -Labs in a.m.  Active Problems: Chronic respiratory failure with hypoxia / COPD -Appears compensated -Continue preadmission Spiriiva as well as Symbicort      Pulmonary HTN (severe-101 mmHg) /Right ventricular systolic dysfunction/Tricuspid regurgitation/Chronic diastolic heart failure, NYHA class 1  -Per outpatient cardiology documentation diuretics previously have been limited due to syncope thought related to orthostatic hypotension -Previously on Demadex 20 mg 3 times a day with metolazone weekly +/- is also on spironolactone -Cardiology consultation requested -Last echocardiogram 2015 so repeat this admission -Diuretics currently on hold as above -Based on clinical exam and chest x-ray currently appears compensated    Diabetes mellitus, type 2  -Per daughter oral hypoglycemic agent recently discontinued. (Hypoglycemia/ckd) -Hgb A1c was 6.6 in July    HTN  -Blood pressure controlled -cont beta blocker    Hepatic cirrhosis  -Currently no ascites -Last elective paracentesis in October was 7 L removed    HLD -Continue statin    CAD -cont statin, beta blocker    Normocytic anemia -Hemoglobin stable and at baseline of 12    DVT Prophylaxis: Subcutaneous heparin  Family Communication: Daughter at bedside    Code Status:  Full code  Condition:  Stable  Discharge disposition: Anticipate discharge back to previous home environment when medically stable  Time spent in minutes : 60      Britney Newstrom L. ANP on 09/24/2015 at 2:16 PM  You may contact me by going to [email or web address] - password [email or web address]  I am available from 7a-7p but please confirm I am on the schedule by going to Amion as above.   After 7p please contact night coverage person covering me after hours  [facility]

## 2015-09-24 NOTE — Progress Notes (Addendum)
1905 - Dr. Sunnie Nielsenegalado returned page. States to monitor for urine output in the next 1-2 hours and then page the oncoming MD if the patient is still having a problem.  Leanna BattlesEckelmann, Aylla Huffine Eileen, RN.  Foley catheter placed at 1700 per Dr. Jon GillsGoldsborough's orders. Patient has a very large hernia in his groin and a swollen scrotum. Foley catheter was inserted without difficulty, no resistance, and urine was present in the tubing before inflating balloon. Patient stated feeling pressure/like he had to urinate immediately after insertion. He agreed to wait a little while to see if the discomfort went away. After about 45 mins, pt notified me and stated he needed it to be removed and that he was still very uncomfortable. Foley catheter was removed at this time. Paged Dr. Kathrene BongoGoldsborough (the ordering provider), but then realized she was no longer on call. Paged Dr. Lowell GuitarPowell twice without receiving a return phone call. Paged Dr. Sunnie Nielsenegalado at [phone] with still no return phone call. Patient states the discomfort has partially resolved after having the catheter removed and receiving prn tylenol. Will continue to monitor and await MD call back/orders.  Leanna BattlesEckelmann, Julieanne Hadsall Eileen, RN.

## 2015-09-24 NOTE — ED Notes (Signed)
Pt reports sent here from PCP for elevated creatinine and concern for kidney failure. Pt is on oxygen 3 L all the time.

## 2015-09-24 NOTE — Progress Notes (Signed)
Cardiology and nephrology notes reviewed. Echocardiogram has resulted with findings concerning for volume overload (diastolic flattening and systolic flattening consistent with RV volume and pressure overload). Patient now with severe tricuspid regurgitation and consistently elevated pulmonary hypertension and continued severe reduction in  RV systolic function. We'll stop fluids for now and follow-up on labs. Suspect patient may benefit from a higher dose/increased frequency IV Lasix to promote renal perfusion and diuresis. Suspect acute renal dysfunction likely related to the past 10 days of Bactrim. I have discontinued IV fluid.  Junious SilkALlison Ellis ANP

## 2015-09-24 NOTE — ED Notes (Signed)
Patient transported to CT 

## 2015-09-24 NOTE — Progress Notes (Signed)
  Echocardiogram 2D Echocardiogram has been performed.  Delcie RochENNINGTON, Bennett Ram 09/24/2015, 5:06 PM

## 2015-09-24 NOTE — ED Provider Notes (Signed)
Patient seen/examined in the Emergency Department in conjunction with Midlevel Provider [place] Patient reports feeling weak, recent fall with hitting his head, and abnormal labs per PCP Exam : awake/alert, no distress but elderly and frail, coarse BS noted bilaterally, no midline cspine tenderness, no focal weakness Plan: will need admission for renal failure/hyperkalemia (not on dialysis)    Zadie Rhineonald Sadler Teschner, MD 09/24/15 [phone]

## 2015-09-24 NOTE — ED Notes (Signed)
Per Dr. Carmell Austriaegaldo can stop the D10 infusion and start the NS bolus

## 2015-09-24 NOTE — ED Notes (Signed)
Pt's CBG result was 137. Informed Sarah - RN.

## 2015-09-25 DIAGNOSIS — E1122 Type 2 diabetes mellitus with diabetic chronic kidney disease: Secondary | ICD-10-CM | POA: Diagnosis present

## 2015-09-25 DIAGNOSIS — Z7902 Long term (current) use of antithrombotics/antiplatelets: Secondary | ICD-10-CM | POA: Diagnosis not present

## 2015-09-25 DIAGNOSIS — N184 Chronic kidney disease, stage 4 (severe): Secondary | ICD-10-CM | POA: Diagnosis present

## 2015-09-25 DIAGNOSIS — Z9981 Dependence on supplemental oxygen: Secondary | ICD-10-CM | POA: Diagnosis not present

## 2015-09-25 DIAGNOSIS — H9190 Unspecified hearing loss, unspecified ear: Secondary | ICD-10-CM | POA: Diagnosis present

## 2015-09-25 DIAGNOSIS — E785 Hyperlipidemia, unspecified: Secondary | ICD-10-CM | POA: Diagnosis present

## 2015-09-25 DIAGNOSIS — D649 Anemia, unspecified: Secondary | ICD-10-CM | POA: Diagnosis present

## 2015-09-25 DIAGNOSIS — I5033 Acute on chronic diastolic (congestive) heart failure: Secondary | ICD-10-CM | POA: Diagnosis not present

## 2015-09-25 DIAGNOSIS — I130 Hypertensive heart and chronic kidney disease with heart failure and stage 1 through stage 4 chronic kidney disease, or unspecified chronic kidney disease: Secondary | ICD-10-CM | POA: Diagnosis present

## 2015-09-25 DIAGNOSIS — I5032 Chronic diastolic (congestive) heart failure: Secondary | ICD-10-CM | POA: Diagnosis not present

## 2015-09-25 DIAGNOSIS — J9611 Chronic respiratory failure with hypoxia: Secondary | ICD-10-CM | POA: Diagnosis present

## 2015-09-25 DIAGNOSIS — E875 Hyperkalemia: Secondary | ICD-10-CM | POA: Diagnosis present

## 2015-09-25 DIAGNOSIS — Z951 Presence of aortocoronary bypass graft: Secondary | ICD-10-CM | POA: Diagnosis not present

## 2015-09-25 DIAGNOSIS — E11649 Type 2 diabetes mellitus with hypoglycemia without coma: Secondary | ICD-10-CM | POA: Diagnosis present

## 2015-09-25 DIAGNOSIS — R188 Other ascites: Secondary | ICD-10-CM | POA: Diagnosis not present

## 2015-09-25 DIAGNOSIS — K5900 Constipation, unspecified: Secondary | ICD-10-CM | POA: Diagnosis not present

## 2015-09-25 DIAGNOSIS — K7460 Unspecified cirrhosis of liver: Secondary | ICD-10-CM | POA: Diagnosis present

## 2015-09-25 DIAGNOSIS — I4892 Unspecified atrial flutter: Secondary | ICD-10-CM | POA: Diagnosis present

## 2015-09-25 DIAGNOSIS — Z23 Encounter for immunization: Secondary | ICD-10-CM | POA: Diagnosis present

## 2015-09-25 DIAGNOSIS — I951 Orthostatic hypotension: Secondary | ICD-10-CM | POA: Diagnosis present

## 2015-09-25 DIAGNOSIS — Z955 Presence of coronary angioplasty implant and graft: Secondary | ICD-10-CM | POA: Diagnosis not present

## 2015-09-25 DIAGNOSIS — I2510 Atherosclerotic heart disease of native coronary artery without angina pectoris: Secondary | ICD-10-CM | POA: Diagnosis present

## 2015-09-25 DIAGNOSIS — E1169 Type 2 diabetes mellitus with other specified complication: Secondary | ICD-10-CM | POA: Diagnosis not present

## 2015-09-25 DIAGNOSIS — J449 Chronic obstructive pulmonary disease, unspecified: Secondary | ICD-10-CM | POA: Diagnosis present

## 2015-09-25 DIAGNOSIS — Z7984 Long term (current) use of oral hypoglycemic drugs: Secondary | ICD-10-CM | POA: Diagnosis not present

## 2015-09-25 DIAGNOSIS — I071 Rheumatic tricuspid insufficiency: Secondary | ICD-10-CM | POA: Diagnosis present

## 2015-09-25 DIAGNOSIS — T370X5A Adverse effect of sulfonamides, initial encounter: Secondary | ICD-10-CM | POA: Diagnosis present

## 2015-09-25 DIAGNOSIS — Z87891 Personal history of nicotine dependence: Secondary | ICD-10-CM | POA: Diagnosis not present

## 2015-09-25 DIAGNOSIS — I484 Atypical atrial flutter: Secondary | ICD-10-CM | POA: Diagnosis not present

## 2015-09-25 DIAGNOSIS — N179 Acute kidney failure, unspecified: Secondary | ICD-10-CM | POA: Diagnosis not present

## 2015-09-25 DIAGNOSIS — N189 Chronic kidney disease, unspecified: Secondary | ICD-10-CM | POA: Diagnosis not present

## 2015-09-25 DIAGNOSIS — G4733 Obstructive sleep apnea (adult) (pediatric): Secondary | ICD-10-CM | POA: Diagnosis present

## 2015-09-25 DIAGNOSIS — E871 Hypo-osmolality and hyponatremia: Secondary | ICD-10-CM | POA: Diagnosis present

## 2015-09-25 DIAGNOSIS — R531 Weakness: Secondary | ICD-10-CM | POA: Diagnosis present

## 2015-09-25 DIAGNOSIS — I272 Other secondary pulmonary hypertension: Secondary | ICD-10-CM | POA: Diagnosis not present

## 2015-09-25 LAB — LIPID PANEL
CHOLESTEROL: 84 mg/dL (ref 0–200)
HDL: 24 mg/dL — AB (ref >40)
LDL CALC: 49 mg/dL (ref 0–99)
TRIGLYCERIDES: 54 mg/dL (ref <150)
Total CHOL/HDL Ratio: 3.5 RATIO
VLDL: 11 mg/dL (ref 0–40)

## 2015-09-25 LAB — BASIC METABOLIC PANEL
ANION GAP: 8 (ref 5–15)
BUN: 65 mg/dL — ABNORMAL HIGH (ref 6–20)
CO2: 24 mmol/L (ref 22–32)
Calcium: 8.3 mg/dL — ABNORMAL LOW (ref 8.9–10.3)
Chloride: 92 mmol/L — ABNORMAL LOW (ref 101–111)
Creatinine, Ser: 2.75 mg/dL — ABNORMAL HIGH (ref 0.61–1.24)
GFR calc Af Amer: 23 mL/min — ABNORMAL LOW (ref >60)
GFR calc non Af Amer: 20 mL/min — ABNORMAL LOW (ref >60)
Glucose, Bld: 165 mg/dL — ABNORMAL HIGH (ref 65–99)
Potassium: 4.6 mmol/L (ref 3.5–5.1)
Sodium: 124 mmol/L — ABNORMAL LOW (ref 135–145)

## 2015-09-25 LAB — GLUCOSE, CAPILLARY
GLUCOSE-CAPILLARY: 101 mg/dL — AB (ref 65–99)
GLUCOSE-CAPILLARY: 87 mg/dL (ref 65–99)
Glucose-Capillary: 155 mg/dL — ABNORMAL HIGH (ref 65–99)
Glucose-Capillary: 221 mg/dL — ABNORMAL HIGH (ref 65–99)

## 2015-09-25 LAB — HEMOGLOBIN A1C
HEMOGLOBIN A1C: 6.5 % — AB (ref 4.8–5.6)
MEAN PLASMA GLUCOSE: 140 mg/dL

## 2015-09-25 MED ORDER — FUROSEMIDE 10 MG/ML IJ SOLN
80.0000 mg | Freq: Four times a day (QID) | INTRAMUSCULAR | Status: DC
  Administered 2015-09-25 – 2015-09-26 (×4): 80 mg via INTRAVENOUS
  Filled 2015-09-25 (×5): qty 8

## 2015-09-25 MED ORDER — GABAPENTIN 100 MG PO CAPS
100.0000 mg | ORAL_CAPSULE | Freq: Every day | ORAL | Status: DC
  Administered 2015-09-25 – 2015-09-28 (×4): 100 mg via ORAL
  Filled 2015-09-25 (×4): qty 1

## 2015-09-25 NOTE — Progress Notes (Signed)
[facility] PROGRESS NOTE  Brendan Arroyo:[id number] DOB: Aug 05, 1929 DOA: 09/24/2015 PCP: [name], MD Brief History: 81 year old male patient with history of diabetes, hypertension, CAD, chronic kidney disease stage III, cirrhosis related to fatty liver disease and remote alcohol use, COPD on chronic 3 L of oxygen, chronic diastolic heart failure in setting of severe pulmonary hypertension and severe RV systolic dysfunction presents with one week of progressive weakness and fatigue.  He was found to be fluid overloaded and was referred tomedical service for further evaluation.   Assessment/Plan: 1. Acute on chronic diastolic heart failure: - admitted to telemetry, and will need diuresis with IV lasix 80 mg every 6 hours - cardiology consulted and recommendations given.  - Echocardiogram done and results reviewed.  - diuresed about 2 liters so far.    Acute on chronic renal failure: Nephrology consulted and recommendations given.  Diuresis and monitor renal parameters.   Hyponatremia: Improving with diuresis.   Atrial flutter vs ectopic atrial tachycardia : - rate controlled.  - relative risk on starting anticoagulation, of his history of falls and he lives by himself.  - currently on plavix. Continue the same.    CAD; Currently denies any chest pain, . Resume plavix.    Diabetes mellitus: hgba1c is 6.5.  Resume SSI.    Vomiting and esophageal stricture; Reports undergoing balloon dilatation one year ago last April.  Will obtain SLP eval and GI consultation in am to see if needs another dilatation.   Code Status: full code  Family Communication: none at bedside, reports her daughter is in [place] Disposition Plan: pending further work up. PT eval ordered.    Consultants:  Cardiology  Renal.  Procedures:  none  Antibiotics:  none  HPI/Subjective: Vomited food and water  no nausea,.  Objective: Filed Vitals:   09/25/15 0818 09/25/15  1552  BP: 111/65 112/64  Pulse: 66 65  Temp: 97.4 F (36.3 C) 97.9 F (36.6 C)  Resp: 18 18    Intake/Output Summary (Last 24 hours) at 09/25/15 1853 Last data filed at 09/25/15 1759  Gross per 24 hour  Intake    240 ml  Output   1617 ml  Net  -1377 ml   Filed Weights   09/25/15 0500  Weight: 85.458 kg (188 lb 6.4 oz)    Exam:   General:  Alert sitting up on the bed, eating lunch  Cardiovascular: s1s2,   Respiratory: diminished at bases, no wheezing or rhonchi.    Abdomen: soft non tender non distended bowel sounds heard.   Musculoskeletal: pedal edema 2+  Data Reviewed: Basic Metabolic Panel:  Recent Labs Lab 09/24/15 1020 09/24/15 1856 09/25/15 0525  NA 121* 123* 124*  K 6.1* 5.2* 4.6  CL 90* 91* 92*  CO2 GLUCOSE 103* 183* 165*  BUN 68* 64* 65*  CREATININE 3.21* 2.92* 2.75*  CALCIUM 8.8* 8.2* 8.3*  MG 2.7*  --   --    Liver Function Tests:  Recent Labs Lab 09/24/15 1020  AST 31  ALT 18  ALKPHOS 93  BILITOT 1.6*  PROT 6.1*  ALBUMIN 3.1*   No results for input(s): LIPASE, AMYLASE in the last 168 hours. No results for input(s): AMMONIA in the last 168 hours. CBC:  Recent Labs Lab 09/24/15 1020  WBC 8.5  NEUTROABS 5.1  HGB 12.1*  HCT 35.1*  MCV 85.6  PLT 252   Cardiac Enzymes: No results for input(s): CKTOTAL, CKMB, CKMBINDEX, TROPONINI in the last 168  hours. BNP (last 3 results)  Recent Labs  09/24/15 1020  BNP 1503.5*    ProBNP (last 3 results) No results for input(s): PROBNP in the last 8760 hours.  CBG:  Recent Labs Lab 09/24/15 1652 09/24/15 2035 09/25/15 0802 09/25/15 1148 09/25/15 1620  GLUCAP 148* 157* 155* 221* 101*    No results found for this or any previous visit (from the past 240 hour(s)).   Studies: Dg Chest 2 View  09/24/2015  CLINICAL DATA:  Fluid overload EXAM: CHEST  2 VIEW COMPARISON:  09/05/2014 FINDINGS: Cardiomediastinal silhouette is stable. Status post median sternotomy. Mild  hyperinflation again noted. No acute infiltrate or pulmonary edema. Stable old right rib fracture. Osteopenia and mild degenerative changes thoracic spine. Atherosclerotic calcifications of thoracic aorta. IMPRESSION: No active disease. Cardiomegaly again noted. Status post median sternotomy. Atherosclerotic calcifications of thoracic aorta. Electronically Signed   By: Natasha Mead M.D.   On: 09/24/2015 14:09   Ct Head Wo Contrast  09/24/2015  CLINICAL DATA:  80 year old male fell backwards several days ago. Head trauma. Initial encounter. EXAM: CT HEAD WITHOUT CONTRAST TECHNIQUE: Contiguous axial images were obtained from the base of the skull through the vertex without intravenous contrast. COMPARISON:  Head CT 08/05/2014. FINDINGS: No scalp hematoma identified. Stable orbits soft tissues. No acute osseous abnormality identified. There are small foci of gas in venous structures at the skullbase (including cavernous sinus) and occasionally visible in the right face. No subcutaneous emphysema. Visualized paranasal sinuses and mastoids are clear. Calcified atherosclerosis at the skull base. Cerebral volume is within normal limits for age. No midline shift, ventriculomegaly, mass effect, evidence of mass lesion, intracranial hemorrhage or evidence of cortically based acute infarction. Gray-white matter differentiation is within normal limits throughout the brain. No suspicious intracranial vascular hyperdensity. IMPRESSION: 1. Normal for age non contrast appearance of the brain. No acute traumatic injury identified. 2. Small volume of intravenous gas at the skullbase likely due to recent IV access. Electronically Signed   By: Odessa Fleming M.D.   On: 09/24/2015 11:57    Scheduled Meds: . atorvastatin  20 mg Oral q morning - 10a  . budesonide-formoterol  2 puff Inhalation BID  . clopidogrel  75 mg Oral Daily  . furosemide  80 mg Intravenous Q6H  . gabapentin  100 mg Oral QHS  . heparin  5,000 Units Subcutaneous 3  times per day  . insulin aspart  0-5 Units Subcutaneous QHS  . insulin aspart  0-9 Units Subcutaneous TID WC  . loratadine  10 mg Oral Daily  . metoprolol tartrate  12.5 mg Oral BID  . multivitamin with minerals  1 tablet Oral Daily  . sodium chloride  3 mL Intravenous Q12H  . tiotropium  18 mcg Inhalation Daily   Continuous Infusions:   Principal Problem:   Acute renal failure (ARF) (HCC) Active Problems:   Diabetes mellitus, type 2 (HCC)   HLD (hyperlipidemia)   HTN (hypertension)   CKD (chronic kidney disease) stage 3, GFR 30-59 ml/min   CAD (coronary artery disease)   COPD (chronic obstructive pulmonary disease) (HCC)   Chronic respiratory failure with hypoxia (HCC)   Hyperkalemia   Normocytic anemia   Hepatic cirrhosis (HCC)   Pulmonary HTN (severe-101 mmHg)   Right ventricular systolic dysfunction   Tricuspid regurgitation   Chronic diastolic heart failure, NYHA class 1 (HCC)   Acute on chronic renal failure (HCC)    Time spent: 25 minutes.     [facility]  [facility] Pager  [phone] If 7PM-7AM, please contact night-coverage at [email or web address], password [email or web address] 09/25/2015, 6:53 PM  LOS: 1 day

## 2015-09-25 NOTE — Progress Notes (Signed)
Pt. Had a bladder scan which showed 0 urinary retention. Pt. Also had a 15cc of urine output shortly after the bladder scan. Pt. Had a c/o of pain and PRN tylenol was given. Will continue to monitor.

## 2015-09-25 NOTE — Progress Notes (Signed)
    Subjective:  Denies CP or dyspnea   Objective:  Filed Vitals:   09/25/15 0431 09/25/15 0500 09/25/15 0818 09/25/15 0840  BP: 116/60  111/65   Pulse: 66  66   Temp: 98.4 F (36.9 C)  97.4 F (36.3 C)   TempSrc: Oral  Oral   Resp: 18  18   Weight:  188 lb 6.4 oz (85.458 kg)    SpO2: 98%  99% 99%    Intake/Output from previous day:  Intake/Output Summary (Last 24 hours) at 09/25/15 [id number] Last data filed at 09/25/15 [id number]  Gross per 24 hour  Intake    630 ml  Output    667 ml  Net    -37 ml    Physical Exam: Physical exam: Well-developed chronically ill appearing in no acute distress.  Skin is warm and dry.  HEENT is normal.  Neck is supple.  Chest with mild expiratory wheeze Cardiovascular exam is irregular, 3/6 systolic murmur LSB Abdominal exam distended; fluid wave, not tender, umbilical hernia Extremities show 2+ edema. neuro grossly intact    Lab Results: Basic Metabolic Panel:  Recent Labs  54/06/8111/23/16 1020 09/24/15 1856 09/25/15 0525  NA 121* 123* 124*  K 6.1* 5.2* 4.6  CL 90* 91* 92*  CO2 23 24 24   GLUCOSE 103* 183* 165*  BUN 68* 64* 65*  CREATININE 3.21* 2.92* 2.75*  CALCIUM 8.8* 8.2* 8.3*  MG 2.7*  --   --    CBC:  Recent Labs  09/24/15 1020  WBC 8.5  NEUTROABS 5.1  HGB 12.1*  HCT 35.1*  MCV 85.6  PLT 252    Assessment/Plan:  1 acute on chronic diastolic congestive heart failure/right heart failure-the patient is volume overloaded on examination. He has presacral edema, ascites and lower extremity edema. Would not hydrate further. We will need to resume diuretics soon. Patient may need repeat large volume paracentesis. 2 acute on chronic renal failure-nephrology following. There may be a contribution from Septra. 3 atrial flutter versus ectopic atrial tachycardia-noted on admission electrocardiogram and telemetry. Heart rate is controlled. CHADSvasc 5. He would benefit from anticoagulation long-term. However he falls at home, has  significant renal insuff and cirrhosis and I feel risk outweighs benefit. Continue Plavix. Atrial flutter could be contributing to worsening cardiac output. However we cannot cardiovert unless we anticoagulate. We can consider short-term anticoagulation and cardioversion if he deteriorates with appropriate therapy. 4 severe pulmonary hypertension 5 coronary artery disease-continue Plavix and statin. 6 electrolyte abnormalities-improving on a.m. laboratories. Management per primary care and nephrology. 7 Home O2 dependent COPD  Olga MillersBrian Arrion Broaddus 09/25/2015, 9:03 AM

## 2015-09-25 NOTE — Progress Notes (Signed)
Subjective:  Events noted- felt to be overloaded so IVF stopped- did not tolerate foley cath so was removed- really no UOP recorded since? He says that is correct.  Creatinine down and sodium up Objective Vital signs in last 24 hours: Filed Vitals:   09/25/15 0431 09/25/15 0500 09/25/15 0818 09/25/15 0840  BP: 116/60  111/65   Pulse: 66  66   Temp: 98.4 F (36.9 C)  97.4 F (36.3 C)   TempSrc: Oral  Oral   Resp: 18  18   Weight:  85.458 kg (188 lb 6.4 oz)    SpO2: 98%  99% 99%   Weight change:   Intake/Output Summary (Last 24 hours) at 09/25/15 1129 Last data filed at 09/25/15 [id number]  Gross per 24 hour  Intake    630 ml  Output    667 ml  Net    -37 ml    Assessment/Plan: 81 year old WM with multiple medical issues including CKD but unknown recent baseline- now presents with presumed worsening of renal function, hyperkalemia and hyponatremia 1.Renal- I had contacted Dr. Klare CopaNyland's office for recent lab information but call was not returned- presumed worsening renal function with hyperkalemia- could this all be due to bactrim ?- family says he started it 10 days ago and took last dose yesterday. Holding bactrim- - U/A is pretty bland so not suggestive of anything ominous. Creatinine slightly better but UOP down ?   2. Hypertension/volume - Now most information points to volume overload- will start some IV lasix high frequency/mod dose so as not to drop pressure  3. Hyperkalemia- was on bactrim and may have been taking potassium supps-resolved  4. Hyponatremia- do not know what this is due to or what his baseline sodium is-  urine osm appropriately low- now possibly hypervolemic hyponatremia so lasix should help- 5. Anemia - not an issue which is interesting- maybe due to his lung dz 6. Urinary retention- was there urethral trauma with initial foley placement ?  Pt says he is going to try and pass urine- will also order bladder scan- if is positive ha may need at least an in and out  cath until he can pass normally    Pinchos Topel A    Labs: Basic Metabolic Panel:  Recent Labs Lab 09/24/15 1020 09/24/15 1856 09/25/15 0525  NA 121* 123* 124*  K 6.1* 5.2* 4.6  CL 90* 91* 92*  CO2 23 24 24   GLUCOSE 103* 183* 165*  BUN 68* 64* 65*  CREATININE 3.21* 2.92* 2.75*  CALCIUM 8.8* 8.2* 8.3*   Liver Function Tests:  Recent Labs Lab 09/24/15 1020  AST 31  ALT 18  ALKPHOS 93  BILITOT 1.6*  PROT 6.1*  ALBUMIN 3.1*   No results for input(s): LIPASE, AMYLASE in the last 168 hours. No results for input(s): AMMONIA in the last 168 hours. CBC:  Recent Labs Lab 09/24/15 1020  WBC 8.5  NEUTROABS 5.1  HGB 12.1*  HCT 35.1*  MCV 85.6  PLT 252   Cardiac Enzymes: No results for input(s): CKTOTAL, CKMB, CKMBINDEX, TROPONINI in the last 168 hours. CBG:  Recent Labs Lab 09/24/15 1234 09/24/15 1506 09/24/15 1652 09/24/15 2035 09/25/15 0802  GLUCAP 137* 79 148* 157* 155*    Iron Studies: No results for input(s): IRON, TIBC, TRANSFERRIN, FERRITIN in the last 72 hours. Studies/Results: Dg Chest 2 View  09/24/2015  CLINICAL DATA:  Fluid overload EXAM: CHEST  2 VIEW COMPARISON:  09/05/2014 FINDINGS: Cardiomediastinal silhouette is stable. Status post  median sternotomy. Mild hyperinflation again noted. No acute infiltrate or pulmonary edema. Stable old right rib fracture. Osteopenia and mild degenerative changes thoracic spine. Atherosclerotic calcifications of thoracic aorta. IMPRESSION: No active disease. Cardiomegaly again noted. Status post median sternotomy. Atherosclerotic calcifications of thoracic aorta. Electronically Signed   By: Natasha Mead M.D.   On: 09/24/2015 14:09   Ct Head Wo Contrast  09/24/2015  CLINICAL DATA:  81 year old male fell backwards several days ago. Head trauma. Initial encounter. EXAM: CT HEAD WITHOUT CONTRAST TECHNIQUE: Contiguous axial images were obtained from the base of the skull through the vertex without intravenous  contrast. COMPARISON:  Head CT 08/05/2014. FINDINGS: No scalp hematoma identified. Stable orbits soft tissues. No acute osseous abnormality identified. There are small foci of gas in venous structures at the skullbase (including cavernous sinus) and occasionally visible in the right face. No subcutaneous emphysema. Visualized paranasal sinuses and mastoids are clear. Calcified atherosclerosis at the skull base. Cerebral volume is within normal limits for age. No midline shift, ventriculomegaly, mass effect, evidence of mass lesion, intracranial hemorrhage or evidence of cortically based acute infarction. Gray-white matter differentiation is within normal limits throughout the brain. No suspicious intracranial vascular hyperdensity. IMPRESSION: 1. Normal for age non contrast appearance of the brain. No acute traumatic injury identified. 2. Small volume of intravenous gas at the skullbase likely due to recent IV access. Electronically Signed   By: Odessa Fleming M.D.   On: 09/24/2015 11:57   Medications: Infusions:    Scheduled Medications: . atorvastatin  20 mg Oral q morning - 10a  . budesonide-formoterol  2 puff Inhalation BID  . clopidogrel  75 mg Oral Daily  . gabapentin  300 mg Oral QHS  . heparin  5,000 Units Subcutaneous 3 times per day  . insulin aspart  0-5 Units Subcutaneous QHS  . insulin aspart  0-9 Units Subcutaneous TID WC  . loratadine  10 mg Oral Daily  . metoprolol tartrate  12.5 mg Oral BID  . multivitamin with minerals  1 tablet Oral Daily  . sodium chloride  3 mL Intravenous Q12H  . tiotropium  18 mcg Inhalation Daily    have reviewed scheduled and prn medications.  Physical Exam: General: sitting up talking with family  Heart: RRR Lungs: dec BS at bases Abdomen: distended Extremities: pitting edema    09/25/2015,11:29 AM  LOS: 1 day

## 2015-09-26 ENCOUNTER — Inpatient Hospital Stay (HOSPITAL_COMMUNITY): Payer: Medicare Other

## 2015-09-26 LAB — BODY FLUID CELL COUNT WITH DIFFERENTIAL
Eos, Fluid: 0 %
LYMPHS FL: 5 %
MONOCYTE-MACROPHAGE-SEROUS FLUID: 93 % — AB (ref 50–90)
Neutrophil Count, Fluid: 2 % (ref 0–25)
Total Nucleated Cell Count, Fluid: 117 cu mm (ref 0–1000)

## 2015-09-26 LAB — BASIC METABOLIC PANEL
ANION GAP: 10 (ref 5–15)
BUN: 61 mg/dL — AB (ref 6–20)
CALCIUM: 8.2 mg/dL — AB (ref 8.9–10.3)
CO2: 26 mmol/L (ref 22–32)
CREATININE: 2.3 mg/dL — AB (ref 0.61–1.24)
Chloride: 92 mmol/L — ABNORMAL LOW (ref 101–111)
GFR calc Af Amer: 28 mL/min — ABNORMAL LOW (ref >60)
GFR, EST NON AFRICAN AMERICAN: 24 mL/min — AB (ref >60)
GLUCOSE: 122 mg/dL — AB (ref 65–99)
Potassium: 3.9 mmol/L (ref 3.5–5.1)
Sodium: 128 mmol/L — ABNORMAL LOW (ref 135–145)

## 2015-09-26 LAB — PROTEIN, BODY FLUID: TOTAL PROTEIN, FLUID: 3.1 g/dL

## 2015-09-26 LAB — GRAM STAIN

## 2015-09-26 LAB — GLUCOSE, CAPILLARY
GLUCOSE-CAPILLARY: 216 mg/dL — AB (ref 65–99)
Glucose-Capillary: 137 mg/dL — ABNORMAL HIGH (ref 65–99)
Glucose-Capillary: 214 mg/dL — ABNORMAL HIGH (ref 65–99)
Glucose-Capillary: 191 mg/dL — ABNORMAL HIGH (ref 65–99)

## 2015-09-26 LAB — LACTATE DEHYDROGENASE, PLEURAL OR PERITONEAL FLUID: LD, Fluid: 102 U/L — ABNORMAL HIGH (ref 3–23)

## 2015-09-26 LAB — MAGNESIUM: Magnesium: 2.4 mg/dL (ref 1.7–2.4)

## 2015-09-26 LAB — ALBUMIN, FLUID (OTHER): ALBUMIN FL: 1.8 g/dL

## 2015-09-26 LAB — GLUCOSE, SEROUS FLUID: GLUCOSE FL: 166 mg/dL

## 2015-09-26 MED ORDER — FUROSEMIDE 10 MG/ML IJ SOLN
40.0000 mg | Freq: Two times a day (BID) | INTRAMUSCULAR | Status: DC
  Administered 2015-09-26 – 2015-09-27 (×2): 40 mg via INTRAVENOUS
  Filled 2015-09-26: qty 4

## 2015-09-26 MED ORDER — LIDOCAINE HCL (PF) 1 % IJ SOLN
INTRAMUSCULAR | Status: AC
  Filled 2015-09-26: qty 10

## 2015-09-26 NOTE — Progress Notes (Signed)
[facility] PROGRESS NOTE  YOAV OKANE ZOX:[id number] DOB: May 03, 1929 DOA: 09/24/2015 PCP: [name], MD Brief History: 81 year old male patient with history of diabetes, hypertension, CAD, chronic kidney disease stage III, cirrhosis related to fatty liver disease and remote alcohol use, COPD on chronic 3 L of oxygen, chronic diastolic heart failure in setting of severe pulmonary hypertension and severe RV systolic dysfunction presents with one week of progressive weakness and fatigue.  He was found to be fluid overloaded and was referred to medical service for further evaluation.   Assessment/Plan: 1. Acute on chronic diastolic heart failure: - admitted to telemetry, and will need diuresis with IV lasix 80 mg every 6 hours - cardiology consulted and recommendations given.  - Echocardiogram done and results reviewed.  - diuresed about 3 liters so far.  - Improving.    Acute on chronic renal failure: Nephrology consulted and recommendations given.  Diuresis and monitor renal parameters.   Hyponatremia: Improving with diuresis.   Atrial flutter vs ectopic atrial tachycardia : - rate controlled.  - relative risk on starting anticoagulation, of his history of falls and he lives by himself.  - currently on plavix. Continue the same.    CAD; Currently denies any chest pain, . Resume plavix.    Diabetes mellitus: hgba1c is 6.5.  Resume SSI.  CBG (last 3)   Recent Labs  09/25/15 2022 09/26/15 0803 09/26/15 1151  GLUCAP 87 137* 214*       Vomiting and esophageal stricture; Reports undergoing balloon dilatation one year ago last April.  Will obtain SLP eval and GI consultation in am to see if needs another dilatation.   Code Status: full code  Family Communication: none at bedside, reports her daughter is in [place] Disposition Plan: pending further work up. PT eval ordered.     Consultants:  Cardiology  Renal.  Procedures:  none  Antibiotics:  none  HPI/Subjective: Vomited food and water  no nausea,.  Objective: Filed Vitals:   09/26/15 1221 09/26/15 1243  BP: 102/60 99/48  Pulse:    Temp:    Resp:      Intake/Output Summary (Last 24 hours) at 09/26/15 1523 Last data filed at 09/26/15 1427  Gross per 24 hour  Intake    480 ml  Output   2472 ml  Net  -1992 ml   Filed Weights   09/25/15 0500 09/25/15 2018 09/26/15 0431  Weight: 85.458 kg (188 lb 6.4 oz) 88.497 kg (195 lb 1.6 oz) 88.497 kg (195 lb 1.6 oz)    Exam:   General:  Alert sitting up on the bed, eating lunch  Cardiovascular: s1s2,   Respiratory: diminished at bases, no wheezing or rhonchi.    Abdomen: soft non tender non distended bowel sounds heard.   Musculoskeletal: pedal edema 2+  Data Reviewed: Basic Metabolic Panel:  Recent Labs Lab 09/24/15 1020 09/24/15 1856 09/25/15 0525 09/26/15 0525  NA 121* 123* 124* 128*  K 6.1* 5.2* 4.6 3.9  CL 90* 91* 92* 92*  CO2 GLUCOSE 103* 183* 165* 122*  BUN 68* 64* 65* 61*  CREATININE 3.21* 2.92* 2.75* 2.30*  CALCIUM 8.8* 8.2* 8.3* 8.2*  MG 2.7*  --   --  2.4   Liver Function Tests:  Recent Labs Lab 09/24/15 1020  AST 31  ALT 18  ALKPHOS 93  BILITOT 1.6*  PROT 6.1*  ALBUMIN 3.1*   No results for input(s): LIPASE, AMYLASE in the last 168 hours. No results for  input(s): AMMONIA in the last 168 hours. CBC:  Recent Labs Lab 09/24/15 1020  WBC 8.5  NEUTROABS 5.1  HGB 12.1*  HCT 35.1*  MCV 85.6  PLT 252   Cardiac Enzymes: No results for input(s): CKTOTAL, CKMB, CKMBINDEX, TROPONINI in the last 168 hours. BNP (last 3 results)  Recent Labs  09/24/15 1020  BNP 1503.5*    ProBNP (last 3 results) No results for input(s): PROBNP in the last 8760 hours.  CBG:  Recent Labs Lab 09/25/15 1148 09/25/15 1620 09/25/15 2022 09/26/15 0803 09/26/15 1151  GLUCAP 221* 101* 87 137* 214*     Recent Results (from the past 240 hour(s))  Culture, body fluid-bottle     Status: None (Preliminary result)   Collection Time: 09/26/15 12:34 PM  Result Value Ref Range Status   Specimen Description FLUID ABDOMEN PERITONEAL  Final   Special Requests BOTTLES DRAWN AEROBIC AND ANAEROBIC 10MLS  Final   Culture PENDING  Incomplete   Report Status PENDING  Incomplete  Gram stain     Status: None   Collection Time: 09/26/15 12:34 PM  Result Value Ref Range Status   Specimen Description FLUID ABDOMEN PERITONEAL  Final   Special Requests NONE  Final   Gram Stain   Final    RARE WBC PRESENT, PREDOMINANTLY PMN NO ORGANISMS SEEN    Report Status 09/26/2015 FINAL  Final     Studies: [place] Paracentesis  09/26/2015  CLINICAL DATA:  81 year old male with recurrent large volume ascites. EXAM: ULTRASOUND GUIDED  PARACENTESIS COMPARISON:  Most recent prior paracentesis 08/09/2015 PROCEDURE: An ultrasound guided paracentesis was thoroughly discussed with the patient and questions answered. The benefits, risks, alternatives and complications were also discussed. The patient understands and wishes to proceed with the procedure. Written consent was obtained. Ultrasound was performed to localize and mark an adequate pocket of fluid in the right lower quadrant of the abdomen. The area was then prepped and draped in the normal sterile fashion. 1% Lidocaine was used for local anesthesia. Under ultrasound guidance a 6 French Safe-T-Centesis catheter was introduced. Paracentesis was performed. The catheter was removed and a dressing applied. COMPLICATIONS: None. FINDINGS: A total of approximately 5000 mL of amber colored ascitic fluid was removed. A fluid sample was sent for laboratory analysis. IMPRESSION: Successful ultrasound guided paracentesis yielding 5 L of ascites. Electronically Signed   By: Malachy MoanHeath  McCullough M.D.   On: 09/26/2015 13:10    Scheduled Meds: . atorvastatin  20 mg Oral q morning - 10a  .  budesonide-formoterol  2 puff Inhalation BID  . clopidogrel  75 mg Oral Daily  . furosemide  40 mg Intravenous Q12H  . gabapentin  100 mg Oral QHS  . heparin  5,000 Units Subcutaneous 3 times per day  . insulin aspart  0-5 Units Subcutaneous QHS  . insulin aspart  0-9 Units Subcutaneous TID WC  . lidocaine (PF)      . loratadine  10 mg Oral Daily  . metoprolol tartrate  12.5 mg Oral BID  . multivitamin with minerals  1 tablet Oral Daily  . sodium chloride  3 mL Intravenous Q12H  . tiotropium  18 mcg Inhalation Daily   Continuous Infusions:   Principal Problem:   Acute renal failure (ARF) (HCC) Active Problems:   Diabetes mellitus, type 2 (HCC)   HLD (hyperlipidemia)   HTN (hypertension)   CKD (chronic kidney disease) stage 3, GFR 30-59 ml/min   CAD (coronary artery disease)   COPD (chronic obstructive pulmonary disease) (  HCC)   Chronic respiratory failure with hypoxia (HCC)   Hyperkalemia   Normocytic anemia   Hepatic cirrhosis (HCC)   Pulmonary HTN (severe-101 mmHg)   Right ventricular systolic dysfunction   Tricuspid regurgitation   Chronic diastolic heart failure, NYHA class 1 (HCC)   Acute on chronic renal failure (HCC)    Time spent: 25 minutes.     [facility]  [facility] Pager [phone] If 7PM-7AM, please contact night-coverage at [email or web address], password [facility] 09/26/2015, 3:23 PM  LOS: 2 days

## 2015-09-26 NOTE — Progress Notes (Signed)
    Subjective:  Denies CP or dyspnea   Objective:  Filed Vitals:   09/25/15 2018 09/25/15 2022 09/26/15 0431 09/26/15 0502  BP: 112/44   108/52  Pulse: 68 66  65  Temp: 97.6 F (36.4 C)   98.2 F (36.8 C)  TempSrc: Axillary   Oral  Resp: 16 16  18   Weight: 195 lb 1.6 oz (88.497 kg)  195 lb 1.6 oz (88.497 kg)   SpO2: 98% 99%  93%    Intake/Output from previous day:  Intake/Output Summary (Last 24 hours) at 09/26/15 0805 Last data filed at 09/26/15 0208  Gross per 24 hour  Intake    240 ml  Output   3327 ml  Net  -3087 ml    Physical Exam: Physical exam: Well-developed chronically ill appearing in no acute distress.  Skin is warm and dry.  HEENT is normal.  Neck is supple.  Chest with mild expiratory wheeze Cardiovascular exam is irregular, 3/6 systolic murmur LSB Abdominal exam distended; fluid wave, not tender, umbilical hernia Extremities show trace edema. neuro grossly intact    Lab Results: Basic Metabolic Panel:  Recent Labs  16/07/9611/23/16 1020  09/25/15 0525 09/26/15 0525  NA 121*  < > 124* 128*  K 6.1*  < > 4.6 3.9  CL 90*  < > 92* 92*  CO2 23  < > 24 26  GLUCOSE 103*  < > 165* 122*  BUN 68*  < > 65* 61*  CREATININE 3.21*  < > 2.75* 2.30*  CALCIUM 8.8*  < > 8.3* 8.2*  MG 2.7*  --   --  2.4  < > = values in this interval not displayed. CBC:  Recent Labs  09/24/15 1020  WBC 8.5  NEUTROABS 5.1  HGB 12.1*  HCT 35.1*  MCV 85.6  PLT 252    Assessment/Plan:  1 acute on chronic diastolic congestive heart failure/right heart failure-the patient's volume status is improving. I and O -[id number]. Continue diuresis and follow renal function. May be able to decrease lasix in next 24 hrs. Patient may need repeat large volume paracentesis. 2 acute on chronic renal failure-nephrology following; Cr improving. There may be a contribution from Septra. 3 atrial flutter versus ectopic atrial tachycardia-noted on admission electrocardiogram and telemetry. Heart  rate is controlled. CHADSvasc 5. He would benefit from anticoagulation long-term. However he falls at home, has significant renal insuff and cirrhosis and I feel risk outweighs benefit. Continue Plavix. Atrial flutter could be contributing to worsening cardiac output. However we cannot cardiovert unless we anticoagulate. We can consider short-term anticoagulation and cardioversion if he deteriorates with appropriate therapy. 4 severe pulmonary hypertension 5 coronary artery disease-continue Plavix and statin. 6 electrolyte abnormalities-improving. Management per primary care and nephrology. 7 Home O2 dependent COPD  Brendan MillersBrian Boss Arroyo 09/26/2015, 8:05 AM

## 2015-09-26 NOTE — Progress Notes (Signed)
Subjective:  Passing urine spontaneously- had over 3 liters- all numbers- creatinine, sodium better- he says he feels better as well   Objective Vital signs in last 24 hours: Filed Vitals:   09/25/15 2022 09/26/15 0431 09/26/15 0502 09/26/15 1010  BP:   108/52 103/46  Pulse: 66  65 64  Temp:   98.2 F (36.8 C) 97.7 F (36.5 C)  TempSrc:   Oral Oral  Resp: Weight:  88.497 kg (195 lb 1.6 oz)    SpO2: 99%  93% 98%   Weight change: 3.039 kg (6 lb 11.2 oz)  Intake/Output Summary (Last 24 hours) at 09/26/15 1016 Last data filed at 09/26/15 0208  Gross per 24 hour  Intake    120 ml  Output   3327 ml  Net  -3207 ml    Assessment/Plan: 81 year old WM with multiple medical issues including CKD but unknown recent baseline- now presents with presumed worsening of renal function, hyperkalemia and hyponatremia 1.Renal- I had contacted Dr. Tranchina Copa office for recent lab information but call was not returned- presumed worsening renal function with hyperkalemia- could this all be due to bactrim ?- family says he started it 10 days PTA and took last dose day of admit. Holding bactrim- - U/A is pretty bland so not suggestive of anything ominous. Creatinine  better and UOP great 2. Hypertension/volume - Now most information points to volume overload- diuresing very well- will decrease dose of lasix 3. Hyperkalemia- was on bactrim and may have been taking potassium supps-resolved  4. Hyponatremia- do not know what his baseline sodium is-  urine osm appropriately low- possibly hypervolemic hyponatremia so lasix should help- is improving 5. Anemia - not an issue which is interesting- maybe due to his lung dz 6. Urinary retention- thankfully it appears resolved    Anshi Jalloh A    Labs: Basic Metabolic Panel:  Recent Labs Lab 09/24/15 1856 09/25/15 0525 09/26/15 0525  NA 123* 124* 128*  K 5.2* 4.6 3.9  CL 91* 92* 92*  CO2 GLUCOSE 183* 165* 122*  BUN 64*  65* 61*  CREATININE 2.92* 2.75* 2.30*  CALCIUM 8.2* 8.3* 8.2*   Liver Function Tests:  Recent Labs Lab 09/24/15 1020  AST 31  ALT 18  ALKPHOS 93  BILITOT 1.6*  PROT 6.1*  ALBUMIN 3.1*   No results for input(s): LIPASE, AMYLASE in the last 168 hours. No results for input(s): AMMONIA in the last 168 hours. CBC:  Recent Labs Lab 09/24/15 1020  WBC 8.5  NEUTROABS 5.1  HGB 12.1*  HCT 35.1*  MCV 85.6  PLT 252   Cardiac Enzymes: No results for input(s): CKTOTAL, CKMB, CKMBINDEX, TROPONINI in the last 168 hours. CBG:  Recent Labs Lab 09/25/15 0802 09/25/15 1148 09/25/15 1620 09/25/15 2022 09/26/15 0803  GLUCAP 155* 221* 101* 87 137*    Iron Studies: No results for input(s): IRON, TIBC, TRANSFERRIN, FERRITIN in the last 72 hours. Studies/Results: Dg Chest 2 View  09/24/2015  CLINICAL DATA:  Fluid overload EXAM: CHEST  2 VIEW COMPARISON:  09/05/2014 FINDINGS: Cardiomediastinal silhouette is stable. Status post median sternotomy. Mild hyperinflation again noted. No acute infiltrate or pulmonary edema. Stable old right rib fracture. Osteopenia and mild degenerative changes thoracic spine. Atherosclerotic calcifications of thoracic aorta. IMPRESSION: No active disease. Cardiomegaly again noted. Status post median sternotomy. Atherosclerotic calcifications of thoracic aorta. Electronically Signed   By: Natasha Mead M.D.   On: 09/24/2015 14:09   Ct  Head Wo Contrast  09/24/2015  CLINICAL DATA:  81 year old male fell backwards several days ago. Head trauma. Initial encounter. EXAM: CT HEAD WITHOUT CONTRAST TECHNIQUE: Contiguous axial images were obtained from the base of the skull through the vertex without intravenous contrast. COMPARISON:  Head CT 08/05/2014. FINDINGS: No scalp hematoma identified. Stable orbits soft tissues. No acute osseous abnormality identified. There are small foci of gas in venous structures at the skullbase (including cavernous sinus) and occasionally  visible in the right face. No subcutaneous emphysema. Visualized paranasal sinuses and mastoids are clear. Calcified atherosclerosis at the skull base. Cerebral volume is within normal limits for age. No midline shift, ventriculomegaly, mass effect, evidence of mass lesion, intracranial hemorrhage or evidence of cortically based acute infarction. Gray-white matter differentiation is within normal limits throughout the brain. No suspicious intracranial vascular hyperdensity. IMPRESSION: 1. Normal for age non contrast appearance of the brain. No acute traumatic injury identified. 2. Small volume of intravenous gas at the skullbase likely due to recent IV access. Electronically Signed   By: Odessa FlemingH  Hall M.D.   On: 09/24/2015 11:57   Medications: Infusions:    Scheduled Medications: . atorvastatin  20 mg Oral q morning - 10a  . budesonide-formoterol  2 puff Inhalation BID  . clopidogrel  75 mg Oral Daily  . furosemide  80 mg Intravenous Q6H  . gabapentin  100 mg Oral QHS  . heparin  5,000 Units Subcutaneous 3 times per day  . insulin aspart  0-5 Units Subcutaneous QHS  . insulin aspart  0-9 Units Subcutaneous TID WC  . loratadine  10 mg Oral Daily  . metoprolol tartrate  12.5 mg Oral BID  . multivitamin with minerals  1 tablet Oral Daily  . sodium chloride  3 mL Intravenous Q12H  . tiotropium  18 mcg Inhalation Daily    have reviewed scheduled and prn medications.  Physical Exam: General: resting-  Heart: RRR Lungs: dec BS at bases Abdomen: distended Extremities: pitting edema    09/26/2015,10:16 AM  LOS: 2 days

## 2015-09-27 DIAGNOSIS — R188 Other ascites: Secondary | ICD-10-CM | POA: Insufficient documentation

## 2015-09-27 LAB — BASIC METABOLIC PANEL
Anion gap: 8 (ref 5–15)
BUN: 60 mg/dL — ABNORMAL HIGH (ref 6–20)
CALCIUM: 8 mg/dL — AB (ref 8.9–10.3)
CO2: 28 mmol/L (ref 22–32)
CREATININE: 1.95 mg/dL — AB (ref 0.61–1.24)
Chloride: 94 mmol/L — ABNORMAL LOW (ref 101–111)
GFR calc non Af Amer: 30 mL/min — ABNORMAL LOW (ref >60)
GFR, EST AFRICAN AMERICAN: 35 mL/min — AB (ref >60)
Glucose, Bld: 242 mg/dL — ABNORMAL HIGH (ref 65–99)
Potassium: 3.7 mmol/L (ref 3.5–5.1)
SODIUM: 130 mmol/L — AB (ref 135–145)

## 2015-09-27 LAB — GLUCOSE, CAPILLARY
GLUCOSE-CAPILLARY: 202 mg/dL — AB (ref 65–99)
GLUCOSE-CAPILLARY: 179 mg/dL — AB (ref 65–99)
Glucose-Capillary: 226 mg/dL — ABNORMAL HIGH (ref 65–99)
Glucose-Capillary: 222 mg/dL — ABNORMAL HIGH (ref 65–99)

## 2015-09-27 LAB — MAGNESIUM: MAGNESIUM: 2.2 mg/dL (ref 1.7–2.4)

## 2015-09-27 MED ORDER — SPIRONOLACTONE 25 MG PO TABS
25.0000 mg | ORAL_TABLET | Freq: Two times a day (BID) | ORAL | Status: DC
  Administered 2015-09-27 – 2015-09-29 (×4): 25 mg via ORAL
  Filled 2015-09-27 (×4): qty 1

## 2015-09-27 MED ORDER — TORSEMIDE 20 MG PO TABS
20.0000 mg | ORAL_TABLET | Freq: Three times a day (TID) | ORAL | Status: DC
  Administered 2015-09-27 – 2015-09-29 (×7): 20 mg via ORAL
  Filled 2015-09-27 (×7): qty 1

## 2015-09-27 MED ORDER — POLYETHYLENE GLYCOL 3350 17 G PO PACK
17.0000 g | PACK | Freq: Every day | ORAL | Status: DC
  Administered 2015-09-27 – 2015-09-29 (×3): 17 g via ORAL
  Filled 2015-09-27 (×3): qty 1

## 2015-09-27 MED ORDER — CETYLPYRIDINIUM CHLORIDE 0.05 % MT LIQD
7.0000 mL | Freq: Two times a day (BID) | OROMUCOSAL | Status: DC
  Administered 2015-09-27 – 2015-09-29 (×5): 7 mL via OROMUCOSAL

## 2015-09-27 MED ORDER — SENNOSIDES-DOCUSATE SODIUM 8.6-50 MG PO TABS
2.0000 | ORAL_TABLET | Freq: Two times a day (BID) | ORAL | Status: DC
  Administered 2015-09-27 – 2015-09-29 (×4): 2 via ORAL
  Filled 2015-09-27 (×4): qty 2

## 2015-09-27 NOTE — Progress Notes (Signed)
Subjective:  Passing urine spontaneously- amount of urine down since I decreased lasix- crt and sodium cont to improve  Objective Vital signs in last 24 hours: Filed Vitals:   09/26/15 2123 09/26/15 2200 09/27/15 0438 09/27/15 0915  BP: 110/49  111/49 120/55  Pulse: 65  67 71  Temp: 98.3 F (36.8 C)  97.9 F (36.6 C) 98.2 F (36.8 C)  TempSrc: Oral  Oral Oral  Resp: 17  18 18   Weight: 83 kg (182 lb 15.7 oz)     SpO2: 100% 93% 94% 95%   Weight change: -5.497 kg (-12 lb 1.9 oz)  Intake/Output Summary (Last 24 hours) at 09/27/15 1011 Last data filed at 09/27/15 [id number]  Gross per 24 hour  Intake   1200 ml  Output   1500 ml  Net   -300 ml    Assessment/Plan: 81 year old WM with multiple medical issues including CKD but unknown recent baseline- now presents with presumed worsening of renal function, hyperkalemia and hyponatremia 1.Renal- I had contacted Dr. Grismore CopaNyland's office for recent lab information but call was not returned- presumed worsening renal function with hyperkalemia- could this all be due to bactrim ?- family says he started it 10 days PTA and took last dose day of admit. Holding bactrim- - U/A is pretty bland so not suggestive of anything ominous. Creatinine better and UOP great 2. Hypertension/volume - volume overload- diuresing very well- will now put him on PO demedex - his home dose as that what he was on as OP 3. Hyperkalemia- was on bactrim and may have been taking potassium supps-resolved - will resume OP aldactone as well 4. Hyponatremia- do not know what his baseline sodium is-  urine osm appropriately low- possibly hypervolemic hyponatremia so lasix should help- is improving 5. Anemia - not an issue which is interesting- maybe due to his lung dz 6. Urinary retention- thankfully it appears resolved   [place]- numbers improving daily- have put him back on his home doses of aldactone and demedex- can follow up with his primary physician and  nephrologist.  Renal will sign off, call with questions  Arriyana Rodell A    Labs: Basic Metabolic Panel:  Recent Labs Lab 09/25/15 0525 09/26/15 0525 09/27/15 0635  NA 124* 128* 130*  K 4.6 3.9 3.7  CL 92* 92* 94*  CO2 24 26 28   GLUCOSE 165* 122* 242*  BUN 65* 61* 60*  CREATININE 2.75* 2.30* 1.95*  CALCIUM 8.3* 8.2* 8.0*   Liver Function Tests:  Recent Labs Lab 09/24/15 1020  AST 31  ALT 18  ALKPHOS 93  BILITOT 1.6*  PROT 6.1*  ALBUMIN 3.1*   No results for input(s): LIPASE, AMYLASE in the last 168 hours. No results for input(s): AMMONIA in the last 168 hours. CBC:  Recent Labs Lab 09/24/15 1020  WBC 8.5  NEUTROABS 5.1  HGB 12.1*  HCT 35.1*  MCV 85.6  PLT 252   Cardiac Enzymes: No results for input(s): CKTOTAL, CKMB, CKMBINDEX, TROPONINI in the last 168 hours. CBG:  Recent Labs Lab 09/26/15 0803 09/26/15 1151 09/26/15 1646 09/26/15 2122 09/27/15 0838  GLUCAP 137* 214* 191* 216* 226*    Iron Studies: No results for input(s): IRON, TIBC, TRANSFERRIN, FERRITIN in the last 72 hours. Studies/Results: [place] Paracentesis  09/26/2015  CLINICAL DATA:  81 year old male with recurrent large volume ascites. EXAM: ULTRASOUND GUIDED  PARACENTESIS COMPARISON:  Most recent prior paracentesis 08/09/2015 PROCEDURE: An ultrasound guided paracentesis was thoroughly  discussed with the patient and questions answered. The benefits, risks, alternatives and complications were also discussed. The patient understands and wishes to proceed with the procedure. Written consent was obtained. Ultrasound was performed to localize and mark an adequate pocket of fluid in the right lower quadrant of the abdomen. The area was then prepped and draped in the normal sterile fashion. 1% Lidocaine was used for local anesthesia. Under ultrasound guidance a 6 French Safe-T-Centesis catheter was introduced. Paracentesis was performed. The catheter was removed and a dressing applied.  COMPLICATIONS: None. FINDINGS: A total of approximately 5000 mL of amber colored ascitic fluid was removed. A fluid sample was sent for laboratory analysis. IMPRESSION: Successful ultrasound guided paracentesis yielding 5 L of ascites. Electronically Signed   By: Malachy Moan M.D.   On: 09/26/2015 13:10   Medications: Infusions:    Scheduled Medications: . atorvastatin  20 mg Oral q morning - 10a  . budesonide-formoterol  2 puff Inhalation BID  . clopidogrel  75 mg Oral Daily  . furosemide  40 mg Intravenous Q12H  . gabapentin  100 mg Oral QHS  . heparin  5,000 Units Subcutaneous 3 times per day  . insulin aspart  0-5 Units Subcutaneous QHS  . insulin aspart  0-9 Units Subcutaneous TID WC  . loratadine  10 mg Oral Daily  . metoprolol tartrate  12.5 mg Oral BID  . multivitamin with minerals  1 tablet Oral Daily  . sodium chloride  3 mL Intravenous Q12H  . tiotropium  18 mcg Inhalation Daily    have reviewed scheduled and prn medications.  Physical Exam: General: resting-  Heart: RRR Lungs: dec BS at bases Abdomen: distended Extremities: pitting edema but better     09/27/2015,10:11 AM  LOS: 3 days

## 2015-09-27 NOTE — Evaluation (Signed)
Clinical/Bedside Swallow Evaluation Patient Details  Name: Brendan Arroyo MRN: [id number] Date of Birth: [birth year]  Today's Date: 09/27/2015 Time: SLP Start Time (ACUTE ONLY): 0945 SLP Stop Time (ACUTE ONLY): 1000 SLP Time Calculation (min) (ACUTE ONLY): 15 min  Past Medical History:  Past Medical History  Diagnosis Date  . Dyslipidemia   . HTN (hypertension)   . DM (diabetes mellitus) (HCC)   . Carotid artery disease (HCC)     a. Duplex 04/2014: suggest upper range 1-39% BICA.  Marland Kitchen CAD (coronary artery disease)     a. s/p very remote angioplasty, CABG 1993. b. H/o DES to dRCA at anastamotic site of VG 2005, DES to OM 2008. c. Low risk nuc 01/2014.  . H/O: gout   . Diverticulitis   . Colon polyps   . Blood transfusion     '93  . GERD (gastroesophageal reflux disease)   . Hyperlipidemia   . Renal insufficiency     a. OP Cr 04/10/14: 1.32.  Marland Kitchen Stricture and stenosis of esophagus 11/06/2011    egd with esoph stricture dilation 11/2011.  [name]  . Esophageal reflux 11/06/2011  . COPD (chronic obstructive pulmonary disease) (HCC)     a. Oxygen use 2l/m nasally usually nightly and during day as needed.   Past Surgical History:  Past Surgical History  Procedure Laterality Date  . Cabg  1993  . Coronary stent placement  July 2005, 2008    taxus stent of the distal right coronary artery   . Elbow surgery      right  . Tonsillectomy    . Back surgery    . Esophagogastroduodenoscopy  07/31/2012    Procedure: ESOPHAGOGASTRODUODENOSCOPY (EGD);  Surgeon: Meryl Dare, MD,FACG;  Location: [facility] END[facility]COPY;  Service: Endoscopy;  Laterality: N/A;  pt has food impaction, takes plavix, so no plans to dilate.  . Coronary artery bypass graft    . Esophagogastroduodenoscopy N/A 01/07/2014    Dr. Arlyce Dice: peptic esophageal stricture s/p dilation  . Balloon dilation N/A 01/07/2014    Procedure: BALLOON DILATION;  Surgeon: Louis Meckel, MD;  Location: [facility] ENDOSCOPY;  Service: Endoscopy;   Laterality: N/A;  . Esophagogastroduodenoscopy N/A 03/09/2014    Dr. Arlyce Dice: esophageal stricture s/p dilation  . Balloon dilation N/A 03/09/2014    Procedure: BALLOON DILATION;  Surgeon: Louis Meckel, MD;  Location: [facility] ENDOSCOPY;  Service: Endoscopy;  Laterality: N/A;  . Yag laser application Right 11/30/2014    Procedure: YAG LASER APPLICATION;  Surgeon: Susa Simmonds, MD;  Location: [facility] ORS;  Service: Ophthalmology;  Laterality: Right;   HPI:  81 year old male patient with history of esophageal stricutre and dilation 11/2012, 12/2013, GERD, diabetes, hypertension, CAD, chronic kidney disease stage III, cirrhosis related to fatty liver disease and remote alcohol use, COPD on chronic 3 L of oxygen, chronic diastolic heart failure in setting of severe pulmonary hypertension and severe RV systolic dysfunction.Per MD note patient has had at least one week of progressive weakness and fatigue. Admitted with worsening renal function with hypokalemia. CXR 12/23  no active disease. Found to have acute on chronic diastolic heart failure.    Assessment / Plan / Recommendation Clinical Impression  Pt reported swallow function has improved since 12/24 but states he should make an appointment soon with his GI doctor re: esophagus. He reported "my food would come back up." No indications of aspiration or complaints of globus sensation during assessment. SLP educated to stay upright after meals (min 30 min), alternate liquids  and solids, etc. Continue regular texture and thin liquids. No f/u ST needed; would benefit from GI follow up for esophageal deficits.       Aspiration Risk  Moderate aspiration risk    Diet Recommendation Regular;Thin liquid   Liquid Administration via: Cup;Straw Medication Administration: Whole meds with liquid Supervision: Patient able to self feed Compensations: Slow rate;Small sips/bites;Follow solids with liquid Postural Changes: Seated upright at 90 degrees;Remain upright for at  least 30 minutes after po intake    Other  Recommendations Oral Care Recommendations: Oral care BID   Follow up Recommendations  None    Frequency and Duration            Prognosis        Swallow Study   General HPI: 81 year old male patient with history of esophageal stricutre and dilation 11/2012, 12/2013, GERD, diabetes, hypertension, CAD, chronic kidney disease stage III, cirrhosis related to fatty liver disease and remote alcohol use, COPD on chronic 3 L of oxygen, chronic diastolic heart failure in setting of severe pulmonary hypertension and severe RV systolic dysfunction.Per MD note patient has had at least one week of progressive weakness and fatigue. Admitted with worsening renal function with hypokalemia. CXR 12/23  no active disease. Found to have acute on chronic diastolic heart failure.  Type of Study: Bedside Swallow Evaluation Previous Swallow Assessment:  (none) Diet Prior to this Study: Regular;Thin liquids Temperature Spikes Noted: No Respiratory Status: Nasal cannula History of Recent Intubation: No Behavior/Cognition: Alert;Cooperative;Pleasant mood Oral Cavity Assessment: Dry Oral Care Completed by SLP: Yes Oral Cavity - Dentition: Dentures, top;Dentures, bottom Vision: Functional for self-feeding Self-Feeding Abilities: Able to feed self Patient Positioning: Upright in bed Baseline Vocal Quality: Normal Volitional Cough: Strong Volitional Swallow: Able to elicit    Oral/Motor/Sensory Function Overall Oral Motor/Sensory Function: Within functional limits   Ice Chips Ice chips: Not tested   Thin Liquid Thin Liquid: Within functional limits Presentation: Cup (does not use straws)    Nectar Thick Nectar Thick Liquid: Not tested   Honey Thick Honey Thick Liquid: Not tested   Puree Puree: Not tested   Solid Solid: Within functional limits       Royce MacadamiaLitaker, Danille Oppedisano Willis 09/27/2015,10:42 AM   Breck CoonsLisa Willis Lonell FaceLitaker M.Ed [employer] Pager [phone]

## 2015-09-27 NOTE — Progress Notes (Signed)
Patient ID: VISHNU MOELLER, male   DOB: Oct 23, 1928, 81 y.o.   MRN: [id number]    Patient Name: Brendan Arroyo Date of Encounter: 09/27/2015     Principal Problem:   Acute renal failure (ARF) (HCC) Active Problems:   Diabetes mellitus, type 2 (HCC)   HLD (hyperlipidemia)   HTN (hypertension)   CKD (chronic kidney disease) stage 3, GFR 30-59 ml/min   CAD (coronary artery disease)   COPD (chronic obstructive pulmonary disease) (HCC)   Chronic respiratory failure with hypoxia (HCC)   Hyperkalemia   Normocytic anemia   Hepatic cirrhosis (HCC)   Pulmonary HTN (severe-101 mmHg)   Right ventricular systolic dysfunction   Tricuspid regurgitation   Chronic diastolic heart failure, NYHA class 1 (HCC)   Acute on chronic renal failure (HCC)    SUBJECTIVE " I feel better. I dont want to go home a day early"  CURRENT MEDS . atorvastatin  20 mg Oral q morning - 10a  . budesonide-formoterol  2 puff Inhalation BID  . clopidogrel  75 mg Oral Daily  . gabapentin  100 mg Oral QHS  . heparin  5,000 Units Subcutaneous 3 times per day  . insulin aspart  0-5 Units Subcutaneous QHS  . insulin aspart  0-9 Units Subcutaneous TID WC  . loratadine  10 mg Oral Daily  . metoprolol tartrate  12.5 mg Oral BID  . multivitamin with minerals  1 tablet Oral Daily  . sodium chloride  3 mL Intravenous Q12H  . spironolactone  25 mg Oral BID  . tiotropium  18 mcg Inhalation Daily  . torsemide  20 mg Oral TID    OBJECTIVE  Filed Vitals:   09/26/15 2123 09/26/15 2200 09/27/15 0438 09/27/15 0915  BP: 110/49  111/49 120/55  Pulse: 65  67 71  Temp: 98.3 F (36.8 C)  97.9 F (36.6 C) 98.2 F (36.8 C)  TempSrc: Oral  Oral Oral  Resp: Weight: 182 lb 15.7 oz (83 kg)     SpO2: 100% 93% 94% 95%    Intake/Output Summary (Last 24 hours) at 09/27/15 1038 Last data filed at 09/27/15 0918  Gross per 24 hour  Intake   1200 ml  Output   1500 ml  Net   -300 ml   Filed Weights   09/25/15 2018  09/26/15 0431 09/26/15 2123  Weight: 195 lb 1.6 oz (88.497 kg) 195 lb 1.6 oz (88.497 kg) 182 lb 15.7 oz (83 kg)    PHYSICAL EXAM  General: Pleasant, lderly man, NAD. Neuro: Alert and oriented X 3. Moves all extremities spontaneously. Psych: Normal affect. HEENT:  Normal  Neck: Supple without bruits or JVD. Lungs:  Resp regular and unlabored, CTA. Heart: RRR no s3, s4, or murmurs. Abdomen: Soft, non-tender, non-distended, BS + x 4.  Extremities: No clubbing, cyanosis or edema. DP/PT/Radials 2+ and equal bilaterally.  Accessory Clinical Findings  CBC No results for input(s): WBC, NEUTROABS, HGB, HCT, MCV, PLT in the last 72 hours. Basic Metabolic Panel  Recent Labs  09/26/15 0525 09/27/15 0635  NA 128* 130*  K 3.9 3.7  CL 92* 94*  CO2 26 28  GLUCOSE 122* 242*  BUN 61* 60*  CREATININE 2.30* 1.95*  CALCIUM 8.2* 8.0*  MG 2.4 2.2   Liver Function Tests No results for input(s): AST, ALT, ALKPHOS, BILITOT, PROT, ALBUMIN in the last 72 hours. No results for input(s): LIPASE, AMYLASE in the last 72 hours. Cardiac Enzymes No results for input(s):  CKTOTAL, CKMB, CKMBINDEX, TROPONINI in the last 72 hours. BNP Invalid input(s): POCBNP D-Dimer No results for input(s): DDIMER in the last 72 hours. Hemoglobin A1C  Recent Labs  09/24/15 1856  HGBA1C 6.5*   Fasting Lipid Panel  Recent Labs  09/25/15 0525  CHOL 84  HDL 24*  LDLCALC 49  TRIG 54  CHOLHDL 3.5   Thyroid Function Tests No results for input(s): TSH, T4TOTAL, T3FREE, THYROIDAB in the last 72 hours.  Invalid input(s): FREET3  TELE  Probable atypical atrial flutter  Radiology/Studies  Dg Chest 2 View  09/24/2015  CLINICAL DATA:  Fluid overload EXAM: CHEST  2 VIEW COMPARISON:  09/05/2014 FINDINGS: Cardiomediastinal silhouette is stable. Status post median sternotomy. Mild hyperinflation again noted. No acute infiltrate or pulmonary edema. Stable old right rib fracture. Osteopenia and mild degenerative  changes thoracic spine. Atherosclerotic calcifications of thoracic aorta. IMPRESSION: No active disease. Cardiomegaly again noted. Status post median sternotomy. Atherosclerotic calcifications of thoracic aorta. Electronically Signed   By: Natasha MeadLiviu  Pop M.D.   On: 09/24/2015 14:09   Ct Head Wo Contrast  09/24/2015  CLINICAL DATA:  81 year old male fell backwards several days ago. Head trauma. Initial encounter. EXAM: CT HEAD WITHOUT CONTRAST TECHNIQUE: Contiguous axial images were obtained from the base of the skull through the vertex without intravenous contrast. COMPARISON:  Head CT 08/05/2014. FINDINGS: No scalp hematoma identified. Stable orbits soft tissues. No acute osseous abnormality identified. There are small foci of gas in venous structures at the skullbase (including cavernous sinus) and occasionally visible in the right face. No subcutaneous emphysema. Visualized paranasal sinuses and mastoids are clear. Calcified atherosclerosis at the skull base. Cerebral volume is within normal limits for age. No midline shift, ventriculomegaly, mass effect, evidence of mass lesion, intracranial hemorrhage or evidence of cortically based acute infarction. Gray-white matter differentiation is within normal limits throughout the brain. No suspicious intracranial vascular hyperdensity. IMPRESSION: 1. Normal for age non contrast appearance of the brain. No acute traumatic injury identified. 2. Small volume of intravenous gas at the skullbase likely due to recent IV access. Electronically Signed   By: Odessa FlemingH  Hall M.D.   On: 09/24/2015 11:57   [place] Paracentesis  09/26/2015  CLINICAL DATA:  81 year old male with recurrent large volume ascites. EXAM: ULTRASOUND GUIDED  PARACENTESIS COMPARISON:  Most recent prior paracentesis 08/09/2015 PROCEDURE: An ultrasound guided paracentesis was thoroughly discussed with the patient and questions answered. The benefits, risks, alternatives and complications were also discussed. The  patient understands and wishes to proceed with the procedure. Written consent was obtained. Ultrasound was performed to localize and mark an adequate pocket of fluid in the right lower quadrant of the abdomen. The area was then prepped and draped in the normal sterile fashion. 1% Lidocaine was used for local anesthesia. Under ultrasound guidance a 6 French Safe-T-Centesis catheter was introduced. Paracentesis was performed. The catheter was removed and a dressing applied. COMPLICATIONS: None. FINDINGS: A total of approximately 5000 mL of amber colored ascitic fluid was removed. A fluid sample was sent for laboratory analysis. IMPRESSION: Successful ultrasound guided paracentesis yielding 5 L of ascites. Electronically Signed   By: Malachy MoanHeath  McCullough M.D.   On: 09/26/2015 13:10    ASSESSMENT AND PLAN  1. Acute on chronic diastolic heart failure 2. Probable Atrial flutter with a controlled VR not on systemic anticoag. 3. Acute on chronic stage 4-5 renal failure Rec: note that his heart failure is improved as is the renal failure. He is probable 24-48 hours from being ready  for discharge.  Gregg Taylor,M.D.  09/27/2015 10:38 AM

## 2015-09-27 NOTE — Progress Notes (Signed)
[facility] PROGRESS NOTE  Brendan Arroyo WJX:[id number] DOB: Sep 24, 1929 DOA: 09/24/2015 PCP: [name], MD Brief History: 81 year old male patient with history of diabetes, hypertension, CAD, chronic kidney disease stage III, cirrhosis related to fatty liver disease and remote alcohol use, COPD on chronic 3 L of oxygen, chronic diastolic heart failure in setting of severe pulmonary hypertension and severe RV systolic dysfunction presents with one week of progressive weakness and fatigue.  He was found to be fluid overloaded and was referred to medical service for further evaluation.   Assessment/Plan: 1. Acute on chronic diastolic heart failure: - admitted to telemetry, and  diuresed with IV lasix 80 mg every 6 hours, and changed to home torsemide and aldactone as per renal. - cardiology consulted and recommendations given.  - Echocardiogram done and results reviewed.  - diuresed about 3.8 liters so far.  - Improving.    Acute on chronic renal failure: Nephrology consulted and recommendations given.  Diuresis and monitor renal parameters.   Hyponatremia: Improving with diuresis.   Atrial flutter vs ectopic atrial tachycardia : - rate controlled.  - relative risk on starting anticoagulation, of his history of falls and he lives by himself.  - currently on plavix. Continue the same.    CAD; Currently denies any chest pain, . Resume plavix.    Diabetes mellitus: hgba1c is 6.5.  Resume SSI.  CBG (last 3)   Recent Labs  09/27/15 0838 09/27/15 1230 09/27/15 1631  GLUCAP 226* 222* 202*       Vomiting and esophageal stricture; Reports undergoing balloon dilatation one year ago last April.   obtained SLP eval and  Requested GI consultation in am suggested outpatient follow up in 2 weeks when he is stable and discharged.   Constipation: stool softeners and miralax ordered.   Code Status: full code  Family Communication: none at bedside, reports her  daughter is in [place] Disposition Plan: pending further work up. PT eval ordered.    Consultants:  Cardiology  Renal.  Procedures:  none  Antibiotics:  none  HPI/Subjective: Vomited food and water  no nausea,.  Objective: Filed Vitals:   09/27/15 0915 09/27/15 1716  BP: 120/55 101/43  Pulse: 71 65  Temp: 98.2 F (36.8 C) 97.7 F (36.5 C)  Resp: 18 17    Intake/Output Summary (Last 24 hours) at 09/27/15 1906 Last data filed at 09/27/15 1624  Gross per 24 hour  Intake    600 ml  Output   1300 ml  Net   -700 ml   Filed Weights   09/25/15 2018 09/26/15 0431 09/26/15 2123  Weight: 88.497 kg (195 lb 1.6 oz) 88.497 kg (195 lb 1.6 oz) 83 kg (182 lb 15.7 oz)    Exam:   General:  Alert sitting up on the bed, eating lunch  Cardiovascular: s1s2,   Respiratory: diminished at bases, no wheezing or rhonchi.    Abdomen: soft non tender non distended bowel sounds heard.   Musculoskeletal: pedal edema 2+  Data Reviewed: Basic Metabolic Panel:  Recent Labs Lab 09/24/15 1020 09/24/15 1856 09/25/15 0525 09/26/15 0525 09/27/15 0635  NA 121* 123* 124* 128* 130*  K 6.1* 5.2* 4.6 3.9 3.7  CL 90* 91* 92* 92* 94*  CO2 GLUCOSE 103* 183* 165* 122* 242*  BUN 68* 64* 65* 61* 60*  CREATININE 3.21* 2.92* 2.75* 2.30* 1.95*  CALCIUM 8.8* 8.2* 8.3* 8.2* 8.0*  MG 2.7*  --   --  2.4 2.2  Liver Function Tests:  Recent Labs Lab 09/24/15 1020  AST 31  ALT 18  ALKPHOS 93  BILITOT 1.6*  PROT 6.1*  ALBUMIN 3.1*   No results for input(s): LIPASE, AMYLASE in the last 168 hours. No results for input(s): AMMONIA in the last 168 hours. CBC:  Recent Labs Lab 09/24/15 1020  WBC 8.5  NEUTROABS 5.1  HGB 12.1*  HCT 35.1*  MCV 85.6  PLT 252   Cardiac Enzymes: No results for input(s): CKTOTAL, CKMB, CKMBINDEX, TROPONINI in the last 168 hours. BNP (last 3 results)  Recent Labs  09/24/15 1020  BNP 1503.5*    ProBNP (last 3 results) No results for  input(s): PROBNP in the last 8760 hours.  CBG:  Recent Labs Lab 09/26/15 1646 09/26/15 2122 09/27/15 0838 09/27/15 1230 09/27/15 1631  GLUCAP 191* 216* 226* 222* 202*    Recent Results (from the past 240 hour(s))  Culture, body fluid-bottle     Status: None (Preliminary result)   Collection Time: 09/26/15 12:34 PM  Result Value Ref Range Status   Specimen Description FLUID ABDOMEN PERITONEAL  Final   Special Requests BOTTLES DRAWN AEROBIC AND ANAEROBIC 10MLS  Final   Culture NO GROWTH < 24 HOURS  Final   Report Status PENDING  Incomplete  Gram stain     Status: None   Collection Time: 09/26/15 12:34 PM  Result Value Ref Range Status   Specimen Description FLUID ABDOMEN PERITONEAL  Final   Special Requests NONE  Final   Gram Stain   Final    RARE WBC PRESENT, PREDOMINANTLY PMN NO ORGANISMS SEEN    Report Status 09/26/2015 FINAL  Final     Studies: [place] Paracentesis  09/26/2015  CLINICAL DATA:  81 year old male with recurrent large volume ascites. EXAM: ULTRASOUND GUIDED  PARACENTESIS COMPARISON:  Most recent prior paracentesis 08/09/2015 PROCEDURE: An ultrasound guided paracentesis was thoroughly discussed with the patient and questions answered. The benefits, risks, alternatives and complications were also discussed. The patient understands and wishes to proceed with the procedure. Written consent was obtained. Ultrasound was performed to localize and mark an adequate pocket of fluid in the right lower quadrant of the abdomen. The area was then prepped and draped in the normal sterile fashion. 1% Lidocaine was used for local anesthesia. Under ultrasound guidance a 6 French Safe-T-Centesis catheter was introduced. Paracentesis was performed. The catheter was removed and a dressing applied. COMPLICATIONS: None. FINDINGS: A total of approximately 5000 mL of amber colored ascitic fluid was removed. A fluid sample was sent for laboratory analysis. IMPRESSION: Successful ultrasound  guided paracentesis yielding 5 L of ascites. Electronically Signed   By: Malachy MoanHeath  McCullough M.D.   On: 09/26/2015 13:10    Scheduled Meds: . antiseptic oral rinse  7 mL Mouth Rinse BID  . atorvastatin  20 mg Oral q morning - 10a  . budesonide-formoterol  2 puff Inhalation BID  . clopidogrel  75 mg Oral Daily  . gabapentin  100 mg Oral QHS  . heparin  5,000 Units Subcutaneous 3 times per day  . insulin aspart  0-5 Units Subcutaneous QHS  . insulin aspart  0-9 Units Subcutaneous TID WC  . loratadine  10 mg Oral Daily  . metoprolol tartrate  12.5 mg Oral BID  . multivitamin with minerals  1 tablet Oral Daily  . polyethylene glycol  17 g Oral Daily  . senna-docusate  2 tablet Oral BID  . sodium chloride  3 mL Intravenous Q12H  . spironolactone  25  mg Oral BID  . tiotropium  18 mcg Inhalation Daily  . torsemide  20 mg Oral TID   Continuous Infusions:   Principal Problem:   Acute renal failure (ARF) (HCC) Active Problems:   Diabetes mellitus, type 2 (HCC)   HLD (hyperlipidemia)   HTN (hypertension)   CKD (chronic kidney disease) stage 3, GFR 30-59 ml/min   CAD (coronary artery disease)   COPD (chronic obstructive pulmonary disease) (HCC)   Chronic respiratory failure with hypoxia (HCC)   Hyperkalemia   Normocytic anemia   Hepatic cirrhosis (HCC)   Pulmonary HTN (severe-101 mmHg)   Right ventricular systolic dysfunction   Tricuspid regurgitation   Chronic diastolic heart failure, NYHA class 1 (HCC)   Acute on chronic renal failure (HCC)    Time spent: 25 minutes.     [facility]  [facility] Pager [phone] If 7PM-7AM, please contact night-coverage at [email or web address], password [facility] 09/27/2015, 7:06 PM  LOS: 3 days

## 2015-09-28 ENCOUNTER — Encounter (HOSPITAL_COMMUNITY): Payer: Self-pay | Admitting: Physician Assistant

## 2015-09-28 DIAGNOSIS — I5033 Acute on chronic diastolic (congestive) heart failure: Secondary | ICD-10-CM

## 2015-09-28 DIAGNOSIS — R188 Other ascites: Secondary | ICD-10-CM

## 2015-09-28 DIAGNOSIS — I484 Atypical atrial flutter: Secondary | ICD-10-CM

## 2015-09-28 DIAGNOSIS — N179 Acute kidney failure, unspecified: Secondary | ICD-10-CM

## 2015-09-28 DIAGNOSIS — E871 Hypo-osmolality and hyponatremia: Secondary | ICD-10-CM

## 2015-09-28 DIAGNOSIS — N189 Chronic kidney disease, unspecified: Secondary | ICD-10-CM

## 2015-09-28 DIAGNOSIS — I4892 Unspecified atrial flutter: Secondary | ICD-10-CM

## 2015-09-28 LAB — GLUCOSE, CAPILLARY
GLUCOSE-CAPILLARY: 154 mg/dL — AB (ref 65–99)
GLUCOSE-CAPILLARY: 193 mg/dL — AB (ref 65–99)
GLUCOSE-CAPILLARY: 211 mg/dL — AB (ref 65–99)
Glucose-Capillary: 110 mg/dL — ABNORMAL HIGH (ref 65–99)
Glucose-Capillary: 234 mg/dL — ABNORMAL HIGH (ref 65–99)

## 2015-09-28 LAB — RENAL FUNCTION PANEL
ANION GAP: 9 (ref 5–15)
Albumin: 2.5 g/dL — ABNORMAL LOW (ref 3.5–5.0)
BUN: 54 mg/dL — ABNORMAL HIGH (ref 6–20)
CO2: 29 mmol/L (ref 22–32)
Calcium: 8.1 mg/dL — ABNORMAL LOW (ref 8.9–10.3)
Chloride: 94 mmol/L — ABNORMAL LOW (ref 101–111)
Creatinine, Ser: 1.72 mg/dL — ABNORMAL HIGH (ref 0.61–1.24)
GFR calc Af Amer: 40 mL/min — ABNORMAL LOW (ref >60)
GFR calc non Af Amer: 35 mL/min — ABNORMAL LOW (ref >60)
GLUCOSE: 114 mg/dL — AB (ref 65–99)
PHOSPHORUS: 2.8 mg/dL (ref 2.5–4.6)
POTASSIUM: 3.7 mmol/L (ref 3.5–5.1)
Sodium: 132 mmol/L — ABNORMAL LOW (ref 135–145)

## 2015-09-28 LAB — MAGNESIUM: Magnesium: 1.8 mg/dL (ref 1.7–2.4)

## 2015-09-28 NOTE — Evaluation (Signed)
Physical Therapy Evaluation Patient Details Name: Brendan Arroyo MRN: [id number] DOB: 1929/01/28 Today's Date: 09/28/2015   History of Present Illness  81 yo male with onset of acute renal failure, CHF, ascites with paracentesis and removal of 5L fluid.  Hx:  cirrhosis, tricuspid regurg, CHF, a-flutter/tach  Clinical Impression  Pt was able to walk up hall with some cues for gait control, has limited control of his balance and will need to be assisted due to RLE instability.  Pt and daughter agreed to SNF and were told that the insurance co will have to approve the plan to be able to do this.  Will continue in house PT visits.    Follow Up Recommendations SNF    Equipment Recommendations  Rolling walker with 5" wheels    Recommendations for Other Services Rehab consult     Precautions / Restrictions Precautions Precautions: Fall Restrictions Weight Bearing Restrictions: No      Mobility  Bed Mobility Overal bed mobility: Needs Assistance Bed Mobility: Supine to Sit     Supine to sit: Min guard;Min assist     General bed mobility comments: help under trunk and pt can use extremities to assist   Transfers Overall transfer level: Needs assistance Equipment used: Rolling walker (2 wheeled);1 person hand held assist Transfers: Sit to/from UGI CorporationStand;Stand Pivot Transfers Sit to Stand: Min guard;Min assist Stand pivot transfers: Min guard;Min assist       General transfer comment: reminders for safe hand placement  Ambulation/Gait Ambulation/Gait assistance: Min guard Ambulation Distance (Feet): 175 Feet Assistive device: Rolling walker (2 wheeled);1 person hand held assist Gait Pattern/deviations: Step-through pattern;Narrow base of support;Trunk flexed;Decreased stride length (crossover steps) Gait velocity: reduced Gait velocity interpretation: Below normal speed for age/gender General Gait Details: Pt was walking on the hall with crossed R foot, inverted and PF,  difficult for him to control his steps.  Stairs            Wheelchair Mobility    Modified Rankin (Stroke Patients Only)       Balance Overall balance assessment: Needs assistance Sitting-balance support: Feet supported Sitting balance-Leahy Scale: Fair   Postural control: Posterior lean Standing balance support: Bilateral upper extremity supported Standing balance-Leahy Scale: Poor                               Pertinent Vitals/Pain Pain Assessment: No/denies pain    Home Living Family/patient expects to be discharged to:: Private residence Living Arrangements: Alone Available Help at Discharge: Available PRN/intermittently Type of Home: House Home Access: Ramped entrance     Home Layout: One level Home Equipment: Environmental consultantWalker - 2 wheels;Cane - single point [facility]    Prior Function Level of Independence: Independent with assistive device(s)         Comments: has no help for home     Hand Dominance        Extremity/Trunk Assessment   Upper Extremity Assessment: Overall WFL for tasks assessed           Lower Extremity Assessment: Generalized weakness      Cervical / Trunk Assessment: Normal  Communication   Communication: No difficulties  Cognition Arousal/Alertness: Awake/alert Behavior During Therapy: WFL for tasks assessed/performed Overall Cognitive Status: Within Functional Limits for tasks assessed                      General  Comments General comments (skin integrity, edema, etc.): Pt was able to be cued for gait and PT assisted with O2, has agreed to SNF consult.  O2 is chronic use, 3L and had stable sats of 100 to 98% and pulses 63 to 72 pre and post gait for both.    Exercises        Assessment/Plan    PT Assessment Patient needs continued PT services  PT Diagnosis Generalized weakness;Difficulty walking   PT Problem List Decreased range of  motion;Decreased strength;Decreased activity tolerance;Decreased balance;Decreased mobility;Decreased coordination;Decreased knowledge of use of DME;Decreased safety awareness;Decreased knowledge of precautions;Cardiopulmonary status limiting activity;Decreased skin integrity  PT Treatment Interventions DME instruction;Gait training;Stair training;Functional mobility training;Therapeutic activities;Therapeutic exercise;Balance training;Neuromuscular re-education;Patient/family education   PT Goals (Current goals can be found in the Care Plan section) Acute Rehab PT Goals Patient Stated Goal: to get home PT Goal Formulation: With patient/family Time For Goal Achievement: 10/12/15 Potential to Achieve Goals: Good    Frequency Min 2X/week   Barriers to discharge Inaccessible home environment;Decreased caregiver support home alone in ramped entrance house    Co-evaluation               End of Session Equipment Utilized During Treatment: Gait belt;Oxygen Activity Tolerance: Patient tolerated treatment well Patient left: in bed;with call bell/phone within reach;with family/visitor present Nurse Communication: Mobility status         Time: 1012-1050 PT Time Calculation (min) (ACUTE ONLY): 38 min   Charges:   PT Evaluation $Initial PT Evaluation Tier I: 1 Procedure PT Treatments $Gait Training: 8-22 mins $Therapeutic Exercise: 8-22 mins   PT G Codes:        Ivar Drape 10/09/15, 1:27 PM   Samul Dada, PT MS Acute Rehab Dept. Number: [id number] [id number] and [facility] [phone]

## 2015-09-28 NOTE — Progress Notes (Signed)
Inpatient Diabetes Program Recommendations  AACE/ADA: New Consensus Statement on Inpatient Glycemic Control (2015)  Target Ranges:  Prepandial:   less than 140 mg/dL      Peak postprandial:   less than 180 mg/dL (1-2 hours)      Critically ill patients:  140 - 180 mg/dL   Review of Glycemic Control  Diabetes history: DM 2 Outpatient Diabetes medications: Glipizide 2.5 mg Daily Current orders for Inpatient glycemic control: Novolog Sensitive + HS scale  Inpatient Diabetes Program Recommendations: Correction (SSI): Glucose increased into the low 200's during the day. Please consider increasing Novolog Correction to Moderate scale.  Thanks,  Christena DeemShannon Emet Rafanan RN, MSN, [facility] Inpatient Diabetes Coordinator Team Pager [phone] (8a-5p)

## 2015-09-28 NOTE — Care Management Important Message (Signed)
Important Message  Patient Details  Name: Brendan Arroyo MRN: [id number] Date of Birth: 06/14/1929   Medicare Important Message Given:  Yes    Kyla BalzarineShealy, Cassundra Mckeever [place] 09/28/2015, 12:40 PM

## 2015-09-28 NOTE — Discharge Summary (Addendum)
Physician Discharge Summary  Brendan Arroyo ZHY:[id number] DOB: Dec 12, 1928 DOA: 09/24/2015  PCP: Brendan HectorNYLAND,[name], MD  Admit date: 09/24/2015 Discharge date: 09/29/2015  Time spent: 30 minutes  Recommendations for Outpatient Follow-up:  1. Follow up with Dr [name] for esophageal dilatation in 2 weeks.  2. Follow up with cardiologist as recommended in 2 weeks.  3. Please obtain BMP in 3 days to follow up creatinine.     Discharge Diagnoses:  Principal Problem:   Acute on chronic diastolic CHF (congestive heart failure) (HCC) Active Problems:   Diabetes mellitus, type 2 (HCC)   HLD (hyperlipidemia)   HTN (hypertension)   CAD (coronary artery disease)   COPD (chronic obstructive pulmonary disease) (HCC)   Chronic respiratory failure with hypoxia (HCC)   Hyperkalemia   Normocytic anemia   Hepatic cirrhosis (HCC)   Pulmonary HTN (severe-101 mmHg)   Right ventricular systolic dysfunction   Tricuspid regurgitation   Acute on chronic renal failure (HCC)   Ascites   Acute kidney injury superimposed on chronic kidney disease (HCC)   Atrial flutter (HCC)   Hyponatremia   Discharge Condition: improved  Diet recommendation: low sodium diet.   Filed Weights   09/26/15 2123 09/27/15 2020 09/28/15 0519  Weight: 83 kg (182 lb 15.7 oz) 82 kg (180 lb 12.4 oz) 82.4 kg (181 lb 10.5 oz)    History of present illness:   81 year old male patient with history of diabetes, hypertension, CAD, chronic kidney disease stage III, cirrhosis related to fatty liver disease and remote alcohol use, COPD on chronic 3 L of oxygen, chronic diastolic heart failure in setting of severe pulmonary hypertension and severe RV systolic dysfunction presents with one week of progressive weakness and fatigue. He was found to be fluid overloaded and was referred to medical service for further evaluation.  Hospital Course:  1. Acute on chronic diastolic heart failure: - admitted to telemetry, and diuresed  with IV lasix 80 mg every 6 hours, and changed to home torsemide and aldactone as per renal. - cardiology consulted and recommendations given.  - Echocardiogram done and results reviewed.  - diuresed about 5 liters so far.  - breathing has improved.  Symptoms have improved.  Discussed the plan of care with the patient's daughter.    Acute on chronic renal failure: Nephrology consulted and recommendations given.  Diuresis and monitor renal parameters.   Hyponatremia: Improving with diuresis.   Atrial flutter vs ectopic atrial tachycardia : - rate controlled.  - relative risk on starting anticoagulation, of his history of falls and he lives by himself.  - currently on plavix. Continue the same.  - outpatient follow upw ith his cardiology on discharge.    CAD; Currently denies any chest pain, . Resume plavix.    Diabetes mellitus: hgba1c is 6.5.  Stopped the oral hypoglycemic agents.  On SSI while in patient.     Vomiting and esophageal stricture; Reports undergoing balloon dilatation one year ago last April.  obtained SLP eval and Requested GI consultation suggested outpatient follow up in 2 weeks when he is stable and discharged from rehab.   Constipation: stool softeners and miralax ordered.    Ascites: Probably from CHF, underwent US paracentesis.  Negative cultures.      Procedures:  US guided paracentesis.  Consultations:  GI over the phone  cardiolgoy  Renal.  Discharge Exam: Filed Vitals:   09/28/15 0519 09/28/15 1424  BP: 108/51 116/90  Pulse: 67 66  Temp: 98.4 F (36.9 C) 98 F (36.7  C)  Resp: 18 88    General: alert sitting on the bed,  Cardiovascular: s1s2 Respiratory: ctab  Discharge Instructions   Discharge Instructions    (HEART FAILURE PATIENTS) Call MD:  Anytime you have any of the following symptoms: 1) 3 pound weight gain in 24 hours or 5 pounds in 1 week 2) shortness of breath, with or without a dry hacking cough  3) swelling in the hands, feet or stomach 4) if you have to sleep on extra pillows at night in order to breathe.    Complete by:  As directed      Call MD for:  difficulty breathing, headache or visual disturbances    Complete by:  As directed      Call MD for:  persistant dizziness or light-headedness    Complete by:  As directed      Diet - low sodium heart healthy    Complete by:  As directed      Increase activity slowly    Complete by:  As directed           Current Discharge Medication List    CONTINUE these medications which have NOT CHANGED   Details  atorvastatin (LIPITOR) 40 MG tablet Take 20 mg by mouth every morning.    budesonide-formoterol (SYMBICORT) 80-4.5 MCG/ACT inhaler Inhale 2 puffs into the lungs 2 (two) times daily.    cetirizine (ZYRTEC) 10 MG tablet Take 10 mg by mouth daily.    clopidogrel (PLAVIX) 75 MG tablet Take 75 mg by mouth daily.    Cyanocobalamin (VITAMIN B-12 IJ) Inject 1,000 mcg as directed every 30 (thirty) days.    gabapentin (NEURONTIN) 300 MG capsule Take 300 mg by mouth at bedtime.    metoprolol tartrate (LOPRESSOR) 25 MG tablet Take 12.5 mg by mouth 2 (two) times daily.     Misc. Devices MISC Patient needs portable oxygen canister Dx :CHF    Multiple Vitamins-Minerals ([employer] SPECTRAVITE SENIOR) TABS Take 1 tablet by mouth daily.      Potassium Chloride ER 20 MEQ TBCR Take 20 mEq by mouth 2 (two) times daily.    spironolactone (ALDACTONE) 25 MG tablet Take 25 mg by mouth 2 (two) times daily.    tiotropium (SPIRIVA) 18 MCG inhalation capsule Place 18 mcg into inhaler and inhale daily.    torsemide (DEMADEX) 20 MG tablet Take 20 mg by mouth 3 (three) times daily.    nitroGLYCERIN (NITROSTAT) 0.4 MG SL tablet Place 0.4 mg under the tongue every 5 (five) minutes as needed for chest pain.      STOP taking these medications     glipiZIDE (GLUCOTROL XL) 5 MG 24 hr tablet      guaifenesin (HUMIBID E) 400 MG TABS tablet       HYDROcodone-acetaminophen (NORCO/VICODIN) 5-325 MG per tablet      metolazone (ZAROXOLYN) 2.5 MG tablet      sulfamethoxazole-trimethoprim (BACTRIM DS,SEPTRA DS) 800-160 MG tablet        No Known Allergies    The results of significant diagnostics from this hospitalization (including imaging, microbiology, ancillary and laboratory) are listed below for reference.    Significant Diagnostic Studies: Dg Chest 2 View  09/24/2015  CLINICAL DATA:  Fluid overload EXAM: CHEST  2 VIEW COMPARISON:  09/05/2014 FINDINGS: Cardiomediastinal silhouette is stable. Status post median sternotomy. Mild hyperinflation again noted. No acute infiltrate or pulmonary edema. Stable old right rib fracture. Osteopenia and mild degenerative changes thoracic spine. Atherosclerotic calcifications of thoracic aorta. IMPRESSION:  No active disease. Cardiomegaly again noted. Status post median sternotomy. Atherosclerotic calcifications of thoracic aorta. Electronically Signed   By: Natasha Mead M.D.   On: 09/24/2015 14:09   Ct Head Wo Contrast  09/24/2015  CLINICAL DATA:  81 year old male fell backwards several days ago. Head trauma. Initial encounter. EXAM: CT HEAD WITHOUT CONTRAST TECHNIQUE: Contiguous axial images were obtained from the base of the skull through the vertex without intravenous contrast. COMPARISON:  Head CT 08/05/2014. FINDINGS: No scalp hematoma identified. Stable orbits soft tissues. No acute osseous abnormality identified. There are small foci of gas in venous structures at the skullbase (including cavernous sinus) and occasionally visible in the right face. No subcutaneous emphysema. Visualized paranasal sinuses and mastoids are clear. Calcified atherosclerosis at the skull base. Cerebral volume is within normal limits for age. No midline shift, ventriculomegaly, mass effect, evidence of mass lesion, intracranial hemorrhage or evidence of cortically based acute infarction. Gray-white matter differentiation  is within normal limits throughout the brain. No suspicious intracranial vascular hyperdensity. IMPRESSION: 1. Normal for age non contrast appearance of the brain. No acute traumatic injury identified. 2. Small volume of intravenous gas at the skullbase likely due to recent IV access. Electronically Signed   By: Odessa Fleming M.D.   On: 09/24/2015 11:57   US Paracentesis  09/26/2015  CLINICAL DATA:  81 year old male with recurrent large volume ascites. EXAM: ULTRASOUND GUIDED  PARACENTESIS COMPARISON:  Most recent prior paracentesis 08/09/2015 PROCEDURE: An ultrasound guided paracentesis was thoroughly discussed with the patient and questions answered. The benefits, risks, alternatives and complications were also discussed. The patient understands and wishes to proceed with the procedure. Written consent was obtained. Ultrasound was performed to localize and mark an adequate pocket of fluid in the right lower quadrant of the abdomen. The area was then prepped and draped in the normal sterile fashion. 1% Lidocaine was used for local anesthesia. Under ultrasound guidance a 6 French Safe-T-Centesis catheter was introduced. Paracentesis was performed. The catheter was removed and a dressing applied. COMPLICATIONS: None. FINDINGS: A total of approximately 5000 mL of amber colored ascitic fluid was removed. A fluid sample was sent for laboratory analysis. IMPRESSION: Successful ultrasound guided paracentesis yielding 5 L of ascites. Electronically Signed   By: Malachy Moan M.D.   On: 09/26/2015 13:10    Microbiology: Recent Results (from the past 240 hour(s))  Culture, body fluid-bottle     Status: None (Preliminary result)   Collection Time: 09/26/15 12:34 PM  Result Value Ref Range Status   Specimen Description FLUID ABDOMEN PERITONEAL  Final   Special Requests BOTTLES DRAWN AEROBIC AND ANAEROBIC  Final   Culture NO GROWTH 2 DAYS  Final   Report Status PENDING  Incomplete  Gram stain     Status:  None   Collection Time: 09/26/15 12:34 PM  Result Value Ref Range Status   Specimen Description FLUID ABDOMEN PERITONEAL  Final   Special Requests NONE  Final   Gram Stain   Final    RARE WBC PRESENT, PREDOMINANTLY PMN NO ORGANISMS SEEN    Report Status 09/26/2015 FINAL  Final     Labs: Basic Metabolic Panel:  Recent Labs Lab 09/24/15 1020 09/24/15 1856 09/25/15 0525 09/26/15 0525 09/27/15 0635 09/28/15 0641  NA 121* 123* 124* 128* 130* 132*  K 6.1* 5.2* 4.6 3.9 3.7 3.7  CL 90* 91* 92* 92* 94* 94*  CO2 GLUCOSE 103* 183* 165* 122* 242* 114*  BUN 68*  64* 65* 61* 60* 54*  CREATININE 3.21* 2.92* 2.75* 2.30* 1.95* 1.72*  CALCIUM 8.8* 8.2* 8.3* 8.2* 8.0* 8.1*  MG 2.7*  --   --  2.4 2.2 1.8  PHOS  --   --   --   --   --  2.8   Liver Function Tests:  Recent Labs Lab 09/24/15 1020 09/28/15 0641  AST 31  --   ALT 18  --   ALKPHOS 93  --   BILITOT 1.6*  --   PROT 6.1*  --   ALBUMIN 3.1* 2.5*   No results for input(s): LIPASE, AMYLASE in the last 168 hours. No results for input(s): AMMONIA in the last 168 hours. CBC:  Recent Labs Lab 09/24/15 1020  WBC 8.5  NEUTROABS 5.1  HGB 12.1*  HCT 35.1*  MCV 85.6  PLT 252   Cardiac Enzymes: No results for input(s): CKTOTAL, CKMB, CKMBINDEX, TROPONINI in the last 168 hours. BNP: BNP (last 3 results)  Recent Labs  09/24/15 1020  BNP 1503.5*    ProBNP (last 3 results) No results for input(s): PROBNP in the last 8760 hours.  CBG:  Recent Labs Lab 09/27/15 2020 09/27/15 2148 09/28/15 0759 09/28/15 1156 09/28/15 1644  GLUCAP 179* 154* 110* 193* 211*       SignedKathlen Mody MD  FACP  [facility] 09/28/2015, 6:20 PM

## 2015-09-28 NOTE — Care Management Note (Signed)
Case Management Note  Patient Details  Name: PAITON FOSCO MRN: [id number] Date of Birth: 11-08-1928  Subjective/Objective:        CM following for progression and d/c planning.            Action/Plan: 09/28/2015 Met with pt and daughter, PT completing PT eval and recommending short term SNF for daily rehab as pt requires assist x1 for sit to stand, standing and walking. Pt and daughter wish to explore short term SNF for rehab. CSW, Consandra notified. Daughter request  info to be faxed to [facility] and [facility].   Expected Discharge Date:     09/29/2015             Expected Discharge Plan:  Skilled Nursing Facility  In-House Referral:  Clinical Social Work  Discharge planning Services  CM Consult  Post Acute Care Choice:  NA Choice offered to:  NA  DME Arranged:  N/A DME Agency:  NA  HH Arranged:  NA HH Agency:  NA  Status of Service:  Completed, signed off  Medicare Important Message Given:    Date Medicare IM Given:    Medicare IM give by:    Date Additional Medicare IM Given:    Additional Medicare Important Message give by:     If discussed at [place] of Stay Meetings, dates discussed:    Additional Comments:  Adron Bene, RN 09/28/2015, 11:31 AM

## 2015-09-28 NOTE — Progress Notes (Signed)
Patient: Brendan Arroyo / Admit Date: 09/24/2015 / Date of Encounter: 09/28/2015, 11:31 AM   Subjective: Steadily feeling closer to baseline. No orthopnea. Edema much improved. Seen by PT today and they are exploring SNF options for short-term rehab.   Objective: Telemetry: atrial flutter, controlled ventricular response Physical Exam: Blood pressure 108/51, pulse 67, temperature 98.4 F (36.9 C), temperature source Oral, resp. rate 18, weight 181 lb 10.5 oz (82.4 kg), SpO2 94 %. General: Well developed, well nourished WM in no acute distress. Laying 20 degrees in bed without dyspnea Head: Normocephalic, atraumatic, sclera non-icteric, no xanthomas, nares are without discharge. Neck:  JVP not elevated. Lungs: Clear bilaterally to auscultation without wheezes, rales, or rhonchi. Breathing is unlabored. Heart: RRR S1 S2 with 2/6 SEM  Abdomen: Soft, non-tender, non-distended with normoactive bowel sounds. No rebound/guarding. Extremities: No clubbing or cyanosis. No significant edema. Neuro: Alert and oriented X 3. Moves all extremities spontaneously. Psych:  Responds to questions appropriately with a normal affect.   Intake/Output Summary (Last 24 hours) at 09/28/15 1131 Last data filed at 09/28/15 0736  Gross per 24 hour  Intake    120 ml  Output   1800 ml  Net  -1680 ml    Inpatient Medications:  . antiseptic oral rinse  7 mL Mouth Rinse BID  . atorvastatin  20 mg Oral q morning - 10a  . budesonide-formoterol  2 puff Inhalation BID  . clopidogrel  75 mg Oral Daily  . gabapentin  100 mg Oral QHS  . heparin  5,000 Units Subcutaneous 3 times per day  . insulin aspart  0-5 Units Subcutaneous QHS  . insulin aspart  0-9 Units Subcutaneous TID WC  . loratadine  10 mg Oral Daily  . metoprolol tartrate  12.5 mg Oral BID  . multivitamin with minerals  1 tablet Oral Daily  . polyethylene glycol  17 g Oral Daily  . senna-docusate  2 tablet Oral BID  . sodium chloride  3 mL  Intravenous Q12H  . spironolactone  25 mg Oral BID  . tiotropium  18 mcg Inhalation Daily  . torsemide  20 mg Oral TID   Infusions:    Labs:  Recent Labs  09/27/15 0635 09/28/15 0641  NA 130* 132*  K 3.7 3.7  CL 94* 94*  CO2 28 29  GLUCOSE 242* 114*  BUN 60* 54*  CREATININE 1.95* 1.72*  CALCIUM 8.0* 8.1*  MG 2.2 1.8  PHOS  --  2.8    Recent Labs  09/28/15 0641  ALBUMIN 2.5*   No results for input(s): WBC, NEUTROABS, HGB, HCT, MCV, PLT in the last 72 hours. No results for input(s): CKTOTAL, CKMB, TROPONINI in the last 72 hours. Invalid input(s): POCBNP No results for input(s): HGBA1C in the last 72 hours.   Radiology/Studies:  Dg Chest 2 View  09/24/2015  CLINICAL DATA:  Fluid overload EXAM: CHEST  2 VIEW COMPARISON:  09/05/2014 FINDINGS: Cardiomediastinal silhouette is stable. Status post median sternotomy. Mild hyperinflation again noted. No acute infiltrate or pulmonary edema. Stable old right rib fracture. Osteopenia and mild degenerative changes thoracic spine. Atherosclerotic calcifications of thoracic aorta. IMPRESSION: No active disease. Cardiomegaly again noted. Status post median sternotomy. Atherosclerotic calcifications of thoracic aorta. Electronically Signed   By: Natasha Mead M.D.   On: 09/24/2015 14:09   Ct Head Wo Contrast  09/24/2015  CLINICAL DATA:  81 year old male fell backwards several days ago. Head trauma. Initial encounter. EXAM: CT HEAD WITHOUT CONTRAST TECHNIQUE: Contiguous axial images  were obtained from the base of the skull through the vertex without intravenous contrast. COMPARISON:  Head CT 08/05/2014. FINDINGS: No scalp hematoma identified. Stable orbits soft tissues. No acute osseous abnormality identified. There are small foci of gas in venous structures at the skullbase (including cavernous sinus) and occasionally visible in the right face. No subcutaneous emphysema. Visualized paranasal sinuses and mastoids are clear. Calcified  atherosclerosis at the skull base. Cerebral volume is within normal limits for age. No midline shift, ventriculomegaly, mass effect, evidence of mass lesion, intracranial hemorrhage or evidence of cortically based acute infarction. Gray-white matter differentiation is within normal limits throughout the brain. No suspicious intracranial vascular hyperdensity. IMPRESSION: 1. Normal for age non contrast appearance of the brain. No acute traumatic injury identified. 2. Small volume of intravenous gas at the skullbase likely due to recent IV access. Electronically Signed   By: Odessa Fleming M.D.   On: 09/24/2015 11:57   US Paracentesis  09/26/2015  CLINICAL DATA:  81 year old male with recurrent large volume ascites. EXAM: ULTRASOUND GUIDED  PARACENTESIS COMPARISON:  Most recent prior paracentesis 08/09/2015 PROCEDURE: An ultrasound guided paracentesis was thoroughly discussed with the patient and questions answered. The benefits, risks, alternatives and complications were also discussed. The patient understands and wishes to proceed with the procedure. Written consent was obtained. Ultrasound was performed to localize and mark an adequate pocket of fluid in the right lower quadrant of the abdomen. The area was then prepped and draped in the normal sterile fashion. 1% Lidocaine was used for local anesthesia. Under ultrasound guidance a 6 French Safe-T-Centesis catheter was introduced. Paracentesis was performed. The catheter was removed and a dressing applied. COMPLICATIONS: None. FINDINGS: A total of approximately 5000 mL of amber colored ascitic fluid was removed. A fluid sample was sent for laboratory analysis. IMPRESSION: Successful ultrasound guided paracentesis yielding 5 L of ascites. Electronically Signed   By: Malachy Moan M.D.   On: 09/26/2015 13:10     Assessment and Plan  85M with CAD s/p CABG 1993, HTN, HLD, carotid artery disease, severe pulmonary hypertension with right-sided heart failure and  chronic diastolic heart failure, COPD with chronic respiratory failure, OSA on CPAP, syncope felt 2/2 orthostatic hypotension, hepatic cirrhosis related to fatty liver disease/remote alcohol use, ascites requiring large-volume paracentesis admitted with confusion, dizziness, fall, progressive weakness He recently had diuretic held 2/2 hyponatremia. Found to have hyperkalemia, AKI (Cr 3.2) on possible CKD (prior Cr 1.13 in 05/2014). Seen by cardiology for acute on chronic diastolic CHF, tachycardia felt atrial flutter versus atrial tachycardia. 2D echo 09/24/15: c/w RV volume and pressure overload, mild-mod dilated LA, severely dilated RV with severely reduced RV systolic function, malcoaptation of valve leaflets with severe TR, severe pulmonary HTN.  1. Acute on chronic diastolic CHF/right heart failure with concomitant pulmonary hypertension and cirrhosis- has diuresed -5.2L thus far and -14lb. Back on home diuretic regimen per renal (torsemide, spiro). Reviewed low sodium diet - he already reports compliance with this. Will need daily weights at SNF with close communication with our office for any weight gain.  2. Probable atrial flutter (per EP) - HR is controlled. CHADSVASC 5 which would warrant anticoagulation but he has significant comorbidities including frequent falls (mechanical, per patient), renal insufficiency, cirrhosis. Dr. Jens Som has felt risk outweighs benefit but did mention we could consider short-term anticoagulation and cardioversion if he deteriorates with appropriate therapy. Will need to f/u closely with TOC visit at primary cardiologist's office to follow his progress while at rehab.  3.  AKI on CKD (?stage III) - per Dr. Verna CzechBranch's note, recent Cr 1.5 down from 1.8. Cr has gradually improved.  4. CAD - per Dr. Verna CzechBranch's note, he has been continued on longterm plavix as opposed to ASA, he is not sure why. No recent chest pain. Continue statin, BB.  Continue to follow.  Signed, Ronie Spiesayna  Dunn PA-C Pager: [phone]   Patient seen and examined and history reviewed. Agree with above findings and plan. Doing much better than when I initially saw him last week. Weight is down and negative fluid balance after large volume paracentesis. Electrolytes and renal function improved. Anticipate DC to Rehab facility.   Ona Roehrs [place], MDFACC 09/28/2015 2:11 PM

## 2015-09-29 ENCOUNTER — Telehealth: Payer: Self-pay | Admitting: Cardiology

## 2015-09-29 LAB — GLUCOSE, CAPILLARY
Glucose-Capillary: 132 mg/dL — ABNORMAL HIGH (ref 65–99)
Glucose-Capillary: 250 mg/dL — ABNORMAL HIGH (ref 65–99)

## 2015-09-29 LAB — RENAL FUNCTION PANEL
ANION GAP: 9 (ref 5–15)
Albumin: 2.6 g/dL — ABNORMAL LOW (ref 3.5–5.0)
BUN: 50 mg/dL — ABNORMAL HIGH (ref 6–20)
CALCIUM: 8.5 mg/dL — AB (ref 8.9–10.3)
CHLORIDE: 93 mmol/L — AB (ref 101–111)
CO2: 30 mmol/L (ref 22–32)
Creatinine, Ser: 1.57 mg/dL — ABNORMAL HIGH (ref 0.61–1.24)
GFR calc non Af Amer: 39 mL/min — ABNORMAL LOW (ref >60)
GFR, EST AFRICAN AMERICAN: 45 mL/min — AB (ref >60)
GLUCOSE: 168 mg/dL — AB (ref 65–99)
POTASSIUM: 4 mmol/L (ref 3.5–5.1)
Phosphorus: 3 mg/dL (ref 2.5–4.6)
SODIUM: 132 mmol/L — AB (ref 135–145)

## 2015-09-29 NOTE — Clinical Social Work Placement (Signed)
   CLINICAL SOCIAL WORK PLACEMENT  NOTE  Date:  09/29/2015  Patient Details  Name: Brendan Arroyo MRN: [id number] Date of Birth: 03-Apr-1929  Clinical Social Work is seeking post-discharge placement for this patient at the Skilled  Nursing Facility level of care (*CSW will initial, date and re-position this form in  chart as items are completed):  Yes   Patient/family provided with [place] Clinical Social Work Department's list of facilities offering this level of care within the geographic area requested by the patient (or if unable, by the patient's family).  Yes   Patient/family informed of their freedom to choose among providers that offer the needed level of care, that participate in Medicare, Medicaid or managed care program needed by the patient, have an available bed and are willing to accept the patient.  Yes   Patient/family informed of [facility]'s ownership interest in [facility] and [facility], as well as of the fact that they are under no obligation to receive care at these facilities.  PASRR submitted to EDS on 09/29/15     PASRR number received on 09/29/15     Existing PASRR number confirmed on       FL2 transmitted to all facilities in geographic area requested by pt/family on 09/29/15     FL2 transmitted to all facilities within larger geographic area on       Patient informed that his/her managed care company has contracts with or will negotiate with certain facilities, including the following:        Yes   Patient/family informed of bed offers received.  [facility]     Physician recommends and patient chooses bed at      Patient to [facility] on 09/29/15.  Patient to be transferred to facility by [facility]     Patient family notified on 09/29/15 of transfer.  Name of family member notified:  Daughter, Brendan Arroyo     PHYSICIAN Please prepare prescriptions     Additional Comment:     _______________________________________________ Mearl LatinNadia S Lamiracle Chaidez, LCSWA 09/29/2015, 12:06 PM

## 2015-09-29 NOTE — Progress Notes (Signed)
Patient seen and examined.  States he feels more energetic than yesterday.  Has improved lower extremity edema, breathing is stable on his home 2L oxygen.  He wants to go to rehabilitation today.    VS: borderline low BP, otherwise stable GEN:  Adult male, NAD CV:  RRR, no mrg PULM:  Rales at the bilateral bases, no rhonchi or wheeze or increased WOB GI:  NABS, soft, NT/ND MSK:  1+ pitting bilateral lower extremity edema  Creatinine continues to improve.  - since yesterday.  Multiple BMs  No change to discharged medication list or follow up.

## 2015-09-29 NOTE — Clinical Social Work Note (Signed)
Clinical Social Work Assessment  Patient Details  Name: Brendan Arroyo MRN: [id number] Date of Birth: Aug 04, 1929  Date of referral:  09/29/15               Reason for consult:  Facility Placement                Permission sought to share information with:  Facility Art therapist granted to share information::  Yes, Verbal Permission Granted  Name::     Brendan Arroyo  Agency::  [facility] SNFs  Relationship::  Daughter  Contact Information:  [phone]  Housing/Transportation Living arrangements for the past 2 months:  [place] of Information:  Patient Patient Interpreter Needed:  None Criminal Activity/Legal Involvement Pertinent to Current Situation/Hospitalization:  No - Comment as needed Significant Relationships:  Adult Children Lives with:  Self Do you feel safe going back to the place where you live?  No Need for family participation in patient care:  Yes (Comment)  Care giving concerns:  CSW received referral for possible SNF placement at time of discharge. CSW met with patient regarding PT recommendation of SNF placement at time of discharge. Patient reported being currently unable to care for patient at his home given patient's current physical needs and fall risk. Patient understanding of PT recommendation and is agreeable to SNF placement at time of discharge. CSW to continue to follow and assist with discharge planning needs.  Social Worker assessment / plan:  CSW spoke with patient concerning possibility of rehab at [facility] before returning home.  Employment status:  Retired Forensic scientist:  Medicare PT Recommendations:  [place] / Referral to community resources:  [place]  Patient/Family's Response to care:  Patient recognizes need for rehab before returning home and is agreeable to a SNF in [place]. Patient reported preference for [facility] or [facility].  Patient/Family's Understanding of and Emotional Response to Diagnosis, Current Treatment, and Prognosis:  Patient is realistic regarding therapy needs. No questions/concerns about plan or treatment.    Emotional Assessment Appearance:  Appears stated age Attitude/Demeanor/Rapport:   (Appropriate) Affect (typically observed):  Accepting, Appropriate, Pleasant Orientation:  Oriented to Self, Oriented to Place, Oriented to  Time, Oriented to Situation Alcohol / Substance use:  Not Applicable Psych involvement (Current and /or in the community):  No (Comment)  Discharge Needs  Concerns to be addressed:  Care Coordination Readmission within the last 30 days:  No Current discharge risk:  None Barriers to Discharge:  No Barriers Identified   Brendan Arroyo, [place] 09/29/2015, 9:24 AM

## 2015-09-29 NOTE — Progress Notes (Signed)
Pt is being discharged to SNF. Discharge packages were given to transporter. Patient is eating lunch at this time and transportation is scheduled for 1330.progres

## 2015-09-29 NOTE — Progress Notes (Signed)
Plans for d/c noted. F/u scheduled in our [place] office 10/06/15 at 1pm with Jacolyn ReedyMichele Lenze - information added to AVS. I asked nurse to make patient aware that app[place] will be [place] [facility] (no availability in [place] next week).  Please call with questions. Dayna Dunn PA-C

## 2015-09-29 NOTE — Progress Notes (Signed)
Patient will DC to: [facility] Anticipated DC date: 09/29/15 Family notified: Daughter, Raynelle FanningJulie Transport by: PTAR  CSW signing off.  Cristobal GoldmannNadia Maygen [place], [place] Clinical Social Worker [phone]

## 2015-09-29 NOTE — Telephone Encounter (Signed)
TOC schedule to see Brendan Arroyo   10/06/15

## 2015-09-29 NOTE — [place] FL2 (Signed)
[place] MEDICAID FL2 LEVEL OF CARE SCREENING TOOL     IDENTIFICATION  Patient Name: Brendan Arroyo Birthdate: 01/26/1929 Sex: male Admission Date (Current Location): 09/24/2015  [facility] [place]   Facility and Address:  The [facility], [address], [place], [place] [address]      Provider Number: [id number]  Attending Physician Name and Address:  Renae FickleMackenzie Short, MD  Relative Name and Phone Number:  Francis DowseJulie, Daughter, [phone]    Current Level of Care: Hospital Recommended Level of Care: Skilled Nursing Facility Prior Approval Number:    Date Approved/Denied:   PASRR Number: [id number]  Discharge Plan: SNF    Current Diagnoses: Patient Active Problem List   Diagnosis Date Noted  . Acute kidney injury superimposed on chronic kidney disease (HCC) 09/28/2015  . Atrial flutter (HCC) 09/28/2015  . Acute on chronic diastolic CHF (congestive heart failure) (HCC) 09/28/2015  . Hyponatremia 09/28/2015  . Ascites   . Pulmonary HTN (severe-101 mmHg) 09/24/2015  . Right ventricular systolic dysfunction 09/24/2015  . Tricuspid regurgitation 09/24/2015  . Acute on chronic renal failure (HCC)   . Hepatic cirrhosis (HCC) 06/03/2015  . Hyperkalemia 05/08/2014  . Normocytic anemia 05/08/2014  . COPD (chronic obstructive pulmonary disease) (HCC) 04/27/2014  . Chronic respiratory failure with hypoxia (HCC) 04/27/2014  . CAD (coronary artery disease)   . Carotid artery disease (HCC)   . Dysphagia 07/31/2012  . Stricture and stenosis of esophagus 12/04/2011  . Esophageal reflux 12/04/2011  . Diabetes mellitus, type 2 (HCC) 01/14/2009  . HLD (hyperlipidemia) 01/14/2009  . HTN (hypertension) 01/14/2009  . GOUT, HX OF 01/14/2009    Orientation RESPIRATION BLADDER Height & Weight    Self, Situation, Time, Place  O2 (Nasal cannula 3L) Continent   180 lbs.  BEHAVIORAL SYMPTOMS/MOOD NEUROLOGICAL BOWEL NUTRITION STATUS   (N/A)   (N/A) Continent  (See DC summary)  AMBULATORY STATUS COMMUNICATION OF NEEDS Skin   Extensive Assist Verbally Normal                       Personal Care Assistance Level of Assistance  Bathing, Feeding, Dressing Bathing Assistance: Limited assistance Feeding assistance: Independent Dressing Assistance: Limited assistance     Functional Limitations Info             SPECIAL CARE FACTORS FREQUENCY  PT (By licensed PT)     PT Frequency: 5x/week              Contractures      Additional Factors Info  Code Status, Allergies, Insulin Sliding Scale Code Status Info: Full Allergies Info: NKA   Insulin Sliding Scale Info: insulin aspart (novoLOG) injection 0-5 Units daily at bedtime; insulin aspart (novoLOG) injection 0-9 Units 3 times w/meals;        Current Medications (09/29/2015):  This is the current hospital active medication list Current Facility-Administered Medications  Medication Dose Route Frequency Provider Last Rate Last Dose  . 0.9 %  sodium chloride infusion  250 mL Intravenous PRN Russella DarAllison L Ellis, NP      . acetaminophen (TYLENOL) tablet 650 mg  650 mg Oral Q4H PRN Russella DarAllison L Ellis, NP   650 mg at 09/25/15 0047  . antiseptic oral rinse (CPC / CETYLPYRIDINIUM CHLORIDE 0.05%) solution 7 mL  7 mL Mouth Rinse BID Kathlen ModyVijaya Akula, MD   7 mL at 09/28/15 2200  . atorvastatin (LIPITOR) tablet 20 mg  20 mg Oral q morning - 10a Revonda StandardAllison  Audry Pili, NP   20 mg at 09/28/15 1105  . budesonide-formoterol (SYMBICORT) 80-4.5 MCG/ACT inhaler 2 puff  2 puff Inhalation BID Russella Dar, NP   2 puff at 09/28/15 2057  . clopidogrel (PLAVIX) tablet 75 mg  75 mg Oral Daily Russella Dar, NP   75 mg at 09/28/15 1106  . gabapentin (NEURONTIN) capsule 100 mg  100 mg Oral QHS Annie Sable, MD   100 mg at 09/28/15 2152  . heparin injection 5,000 Units  5,000 Units Subcutaneous 3 times per day Russella Dar, NP   5,000 Units at 09/29/15 8469  . insulin aspart (novoLOG) injection  0-5 Units  0-5 Units Subcutaneous QHS Russella Dar, NP   2 Units at 09/28/15 2152  . insulin aspart (novoLOG) injection 0-9 Units  0-9 Units Subcutaneous TID WC Russella Dar, NP   3 Units at 09/28/15 1729  . loratadine (CLARITIN) tablet 10 mg  10 mg Oral Daily Russella Dar, NP   10 mg at 09/28/15 1106  . metoprolol tartrate (LOPRESSOR) tablet 12.5 mg  12.5 mg Oral BID Russella Dar, NP   12.5 mg at 09/28/15 2152  . multivitamin with minerals tablet 1 tablet  1 tablet Oral Daily Russella Dar, NP   1 tablet at 09/28/15 1106  . ondansetron (ZOFRAN) injection 4 mg  4 mg Intravenous Q6H PRN Russella Dar, NP      . polyethylene glycol (MIRALAX / GLYCOLAX) packet 17 g  17 g Oral Daily Kathlen Mody, MD   17 g at 09/28/15 1107  . senna-docusate (Senokot-S) tablet 2 tablet  2 tablet Oral BID Kathlen Mody, MD   2 tablet at 09/28/15 2152  . sodium chloride 0.9 % injection 3 mL  3 mL Intravenous Q12H Russella Dar, NP   3 mL at 09/28/15 2157  . sodium chloride 0.9 % injection 3 mL  3 mL Intravenous PRN Russella Dar, NP      . spironolactone (ALDACTONE) tablet 25 mg  25 mg Oral BID Annie Sable, MD   25 mg at 09/28/15 1740  . tiotropium (SPIRIVA) inhalation capsule 18 mcg  18 mcg Inhalation Daily Russella Dar, NP   18 mcg at 09/28/15 0831  . torsemide (DEMADEX) tablet 20 mg  20 mg Oral TID Annie Sable, MD   20 mg at 09/28/15 2152     Discharge Medications: Please see discharge summary for a list of discharge medications.  Relevant Imaging Results:  Relevant Lab Results:   Additional Information SSN: [id number]  Mearl Latin, LCSWA

## 2015-09-29 NOTE — H&P (Signed)
Pt is being discharged to SNF. Discharge packages were given to transporter. Patient is eating lunch at this time and transportation is scheduled for 1330.

## 2015-10-01 LAB — CULTURE, BODY FLUID W GRAM STAIN -BOTTLE: Culture: NO GROWTH

## 2015-10-01 NOTE — Telephone Encounter (Signed)
LM for pt to return call - appt with Brendan Arroyo was cx by pt saying he was in rehab.

## 2015-10-01 NOTE — Telephone Encounter (Signed)
Left message to return call 

## 2015-10-06 ENCOUNTER — Encounter: Payer: Medicare Other | Admitting: Physician Assistant

## 2015-11-05 ENCOUNTER — Encounter: Payer: Medicare Other | Admitting: Cardiology

## 2015-12-01 DEATH — deceased

## 2015-12-13 ENCOUNTER — Ambulatory Visit: Payer: Medicare Other | Admitting: Cardiology

## 2016-02-29 ENCOUNTER — Ambulatory Visit: Payer: Medicare Other | Admitting: Gastroenterology
